# Patient Record
Sex: Female | Born: 1940 | Race: White | Hispanic: No | Marital: Married | Smoking: Never smoker
Health system: Southern US, Community
[De-identification: ages and names within clinical notes are randomized; demographics above are authoritative.]

## PROBLEM LIST (undated history)

## (undated) DIAGNOSIS — D0362 Melanoma in situ of left upper limb, including shoulder: Secondary | ICD-10-CM

## (undated) DIAGNOSIS — Z973 Presence of spectacles and contact lenses: Secondary | ICD-10-CM

## (undated) DIAGNOSIS — I351 Nonrheumatic aortic (valve) insufficiency: Secondary | ICD-10-CM

## (undated) DIAGNOSIS — M4316 Spondylolisthesis, lumbar region: Secondary | ICD-10-CM

## (undated) DIAGNOSIS — Z972 Presence of dental prosthetic device (complete) (partial): Secondary | ICD-10-CM

## (undated) DIAGNOSIS — I779 Disorder of arteries and arterioles, unspecified: Secondary | ICD-10-CM

## (undated) DIAGNOSIS — K219 Gastro-esophageal reflux disease without esophagitis: Secondary | ICD-10-CM

## (undated) DIAGNOSIS — M1990 Unspecified osteoarthritis, unspecified site: Secondary | ICD-10-CM

## (undated) DIAGNOSIS — I340 Nonrheumatic mitral (valve) insufficiency: Secondary | ICD-10-CM

## (undated) DIAGNOSIS — G2581 Restless legs syndrome: Secondary | ICD-10-CM

## (undated) DIAGNOSIS — I10 Essential (primary) hypertension: Secondary | ICD-10-CM

## (undated) DIAGNOSIS — I350 Nonrheumatic aortic (valve) stenosis: Secondary | ICD-10-CM

## (undated) DIAGNOSIS — R0600 Dyspnea, unspecified: Secondary | ICD-10-CM

## (undated) DIAGNOSIS — H9190 Unspecified hearing loss, unspecified ear: Secondary | ICD-10-CM

## (undated) DIAGNOSIS — R011 Cardiac murmur, unspecified: Secondary | ICD-10-CM

## (undated) DIAGNOSIS — I5032 Chronic diastolic (congestive) heart failure: Secondary | ICD-10-CM

## (undated) DIAGNOSIS — I619 Nontraumatic intracerebral hemorrhage, unspecified: Secondary | ICD-10-CM

## (undated) HISTORY — DX: Unspecified osteoarthritis, unspecified site: M19.90

## (undated) HISTORY — PX: POLYPECTOMY: SHX149

## (undated) HISTORY — PX: COLONOSCOPY: SHX174

## (undated) HISTORY — DX: Melanoma in situ of left upper limb, including shoulder: D03.62

## (undated) HISTORY — PX: VAGINAL HYSTERECTOMY: SUR661

## (undated) HISTORY — PX: KNEE SURGERY: SHX244

## (undated) HISTORY — PX: COLONOSCOPY W/ BIOPSIES AND POLYPECTOMY: SHX1376

## (undated) HISTORY — DX: Gastro-esophageal reflux disease without esophagitis: K21.9

## (undated) HISTORY — DX: Restless legs syndrome: G25.81

## (undated) HISTORY — PX: MULTIPLE TOOTH EXTRACTIONS: SHX2053

## (undated) HISTORY — PX: UPPER GASTROINTESTINAL ENDOSCOPY: SHX188

## (undated) HISTORY — DX: Nonrheumatic aortic (valve) stenosis: I35.0

---

## 1998-09-20 ENCOUNTER — Other Ambulatory Visit: Admission: RE | Admit: 1998-09-20 | Discharge: 1998-09-20 | Payer: Self-pay | Admitting: Gastroenterology

## 1999-07-11 ENCOUNTER — Other Ambulatory Visit: Admission: RE | Admit: 1999-07-11 | Discharge: 1999-07-11 | Payer: Self-pay | Admitting: Obstetrics & Gynecology

## 2000-09-27 ENCOUNTER — Other Ambulatory Visit: Admission: RE | Admit: 2000-09-27 | Discharge: 2000-09-27 | Payer: Self-pay | Admitting: Obstetrics & Gynecology

## 2001-11-04 ENCOUNTER — Other Ambulatory Visit: Admission: RE | Admit: 2001-11-04 | Discharge: 2001-11-04 | Payer: Self-pay | Admitting: Obstetrics & Gynecology

## 2002-12-24 ENCOUNTER — Other Ambulatory Visit: Admission: RE | Admit: 2002-12-24 | Discharge: 2002-12-24 | Payer: Self-pay | Admitting: Obstetrics & Gynecology

## 2003-01-05 ENCOUNTER — Encounter: Payer: Self-pay | Admitting: Obstetrics & Gynecology

## 2003-01-05 ENCOUNTER — Ambulatory Visit (HOSPITAL_COMMUNITY): Admission: RE | Admit: 2003-01-05 | Discharge: 2003-01-05 | Payer: Self-pay | Admitting: Obstetrics & Gynecology

## 2003-12-30 ENCOUNTER — Other Ambulatory Visit: Admission: RE | Admit: 2003-12-30 | Discharge: 2003-12-30 | Payer: Self-pay | Admitting: Obstetrics & Gynecology

## 2005-04-13 ENCOUNTER — Other Ambulatory Visit: Admission: RE | Admit: 2005-04-13 | Discharge: 2005-04-13 | Payer: Self-pay | Admitting: Obstetrics & Gynecology

## 2005-09-07 ENCOUNTER — Ambulatory Visit: Payer: Self-pay | Admitting: Internal Medicine

## 2005-10-10 ENCOUNTER — Ambulatory Visit: Payer: Self-pay | Admitting: Internal Medicine

## 2005-12-14 ENCOUNTER — Ambulatory Visit: Payer: Self-pay | Admitting: Internal Medicine

## 2007-05-14 ENCOUNTER — Ambulatory Visit (HOSPITAL_COMMUNITY): Admission: RE | Admit: 2007-05-14 | Discharge: 2007-05-14 | Payer: Self-pay | Admitting: Obstetrics & Gynecology

## 2007-05-22 ENCOUNTER — Encounter: Admission: RE | Admit: 2007-05-22 | Discharge: 2007-05-22 | Payer: Self-pay | Admitting: Obstetrics & Gynecology

## 2007-11-07 ENCOUNTER — Encounter: Admission: RE | Admit: 2007-11-07 | Discharge: 2007-11-07 | Payer: Self-pay | Admitting: Obstetrics & Gynecology

## 2008-05-21 ENCOUNTER — Encounter: Admission: RE | Admit: 2008-05-21 | Discharge: 2008-05-21 | Payer: Self-pay | Admitting: Obstetrics & Gynecology

## 2009-05-24 ENCOUNTER — Encounter: Admission: RE | Admit: 2009-05-24 | Discharge: 2009-05-24 | Payer: Self-pay | Admitting: Obstetrics & Gynecology

## 2010-05-25 ENCOUNTER — Encounter: Admission: RE | Admit: 2010-05-25 | Discharge: 2010-05-25 | Payer: Self-pay | Admitting: Obstetrics & Gynecology

## 2010-08-21 ENCOUNTER — Encounter: Payer: Self-pay | Admitting: Obstetrics & Gynecology

## 2010-12-09 ENCOUNTER — Ambulatory Visit (AMBULATORY_SURGERY_CENTER): Payer: Medicare Other | Admitting: *Deleted

## 2010-12-09 VITALS — Ht 65.0 in | Wt 160.0 lb

## 2010-12-09 DIAGNOSIS — Z86010 Personal history of colonic polyps: Secondary | ICD-10-CM

## 2010-12-09 MED ORDER — PEG-KCL-NACL-NASULF-NA ASC-C 100 G PO SOLR: ORAL | Status: DC

## 2010-12-23 ENCOUNTER — Encounter: Payer: Self-pay | Admitting: Gastroenterology

## 2010-12-23 ENCOUNTER — Ambulatory Visit (AMBULATORY_SURGERY_CENTER): Payer: Medicare Other | Admitting: Gastroenterology

## 2010-12-23 VITALS — BP 138/64 | HR 60 | Temp 96.7°F | Resp 20 | Ht 64.5 in | Wt 155.0 lb

## 2010-12-23 DIAGNOSIS — Z1211 Encounter for screening for malignant neoplasm of colon: Secondary | ICD-10-CM

## 2010-12-23 DIAGNOSIS — Z86010 Personal history of colonic polyps: Secondary | ICD-10-CM

## 2010-12-23 DIAGNOSIS — K648 Other hemorrhoids: Secondary | ICD-10-CM

## 2010-12-23 MED ORDER — SODIUM CHLORIDE 0.9 % IV SOLN: 500.0000 mL | INTRAVENOUS | Status: DC

## 2010-12-23 NOTE — Progress Notes (Signed)
Patient moved to conference room to wake up more before discharge.  Coffee given and husband with patient

## 2010-12-23 NOTE — Patient Instructions (Signed)
Please review discharge instructions Please read information about internal hemorrhoids

## 2010-12-27 ENCOUNTER — Telehealth: Payer: Self-pay | Admitting: *Deleted

## 2010-12-27 NOTE — Telephone Encounter (Signed)

## 2011-04-17 ENCOUNTER — Other Ambulatory Visit: Payer: Self-pay | Admitting: Obstetrics & Gynecology

## 2011-04-17 DIAGNOSIS — Z1231 Encounter for screening mammogram for malignant neoplasm of breast: Secondary | ICD-10-CM

## 2011-05-10 ENCOUNTER — Ambulatory Visit
Admission: RE | Admit: 2011-05-10 | Discharge: 2011-05-10 | Disposition: A | Discharge status: Home / Self Care | Payer: Medicare Other | Source: Ambulatory Visit | Attending: Obstetrics & Gynecology | Admitting: Obstetrics & Gynecology

## 2011-05-10 DIAGNOSIS — Z1231 Encounter for screening mammogram for malignant neoplasm of breast: Secondary | ICD-10-CM

## 2011-05-17 ENCOUNTER — Other Ambulatory Visit: Payer: Self-pay | Admitting: Obstetrics & Gynecology

## 2011-05-17 DIAGNOSIS — R928 Other abnormal and inconclusive findings on diagnostic imaging of breast: Secondary | ICD-10-CM

## 2011-05-29 ENCOUNTER — Ambulatory Visit
Admission: RE | Admit: 2011-05-29 | Discharge: 2011-05-29 | Disposition: A | Discharge status: Home / Self Care | Payer: Medicare Other | Source: Ambulatory Visit | Attending: Obstetrics & Gynecology | Admitting: Obstetrics & Gynecology

## 2011-05-29 DIAGNOSIS — R928 Other abnormal and inconclusive findings on diagnostic imaging of breast: Secondary | ICD-10-CM

## 2012-12-29 ENCOUNTER — Emergency Department (HOSPITAL_COMMUNITY)
Admission: EM | Admit: 2012-12-29 | Discharge: 2012-12-29 | Disposition: A | Discharge status: Home / Self Care | Payer: Medicare Other | Attending: Emergency Medicine | Admitting: Emergency Medicine

## 2012-12-29 ENCOUNTER — Encounter (HOSPITAL_COMMUNITY): Payer: Self-pay | Admitting: Emergency Medicine

## 2012-12-29 DIAGNOSIS — K219 Gastro-esophageal reflux disease without esophagitis: Secondary | ICD-10-CM | POA: Insufficient documentation

## 2012-12-29 DIAGNOSIS — R231 Pallor: Secondary | ICD-10-CM | POA: Insufficient documentation

## 2012-12-29 DIAGNOSIS — R55 Syncope and collapse: Secondary | ICD-10-CM

## 2012-12-29 DIAGNOSIS — R42 Dizziness and giddiness: Secondary | ICD-10-CM | POA: Insufficient documentation

## 2012-12-29 DIAGNOSIS — M129 Arthropathy, unspecified: Secondary | ICD-10-CM | POA: Insufficient documentation

## 2012-12-29 DIAGNOSIS — Z79899 Other long term (current) drug therapy: Secondary | ICD-10-CM | POA: Insufficient documentation

## 2012-12-29 LAB — URINALYSIS, ROUTINE W REFLEX MICROSCOPIC
Bilirubin Urine: NEGATIVE
Glucose, UA: NEGATIVE mg/dL
Hgb urine dipstick: NEGATIVE
Ketones, ur: 15 mg/dL — AB
Leukocytes, UA: NEGATIVE
Nitrite: NEGATIVE
Protein, ur: NEGATIVE mg/dL
Specific Gravity, Urine: 1.021 (ref 1.005–1.030)
Urobilinogen, UA: 0.2 mg/dL (ref 0.0–1.0)
pH: 6 (ref 5.0–8.0)

## 2012-12-29 LAB — POCT I-STAT TROPONIN I
Troponin i, poc: 0 ng/mL (ref 0.00–0.08)
Troponin i, poc: 0 ng/mL (ref 0.00–0.08)

## 2012-12-29 LAB — CBC WITH DIFFERENTIAL/PLATELET
Basophils Absolute: 0 10*3/uL (ref 0.0–0.1)
Basophils Relative: 0 % (ref 0–1)
Eosinophils Absolute: 0 10*3/uL (ref 0.0–0.7)
Eosinophils Relative: 0 % (ref 0–5)
HCT: 39.9 % (ref 36.0–46.0)
Hemoglobin: 13.6 g/dL (ref 12.0–15.0)
Lymphocytes Relative: 2 % — ABNORMAL LOW (ref 12–46)
Lymphs Abs: 0.4 K/uL — ABNORMAL LOW (ref 0.7–4.0)
MCH: 32.6 pg (ref 26.0–34.0)
MCHC: 34.1 g/dL (ref 30.0–36.0)
MCV: 95.7 fL (ref 78.0–100.0)
Monocytes Absolute: 0.3 10*3/uL (ref 0.1–1.0)
Monocytes Relative: 1 % — ABNORMAL LOW (ref 3–12)
Neutro Abs: 16.6 10*3/uL — ABNORMAL HIGH (ref 1.7–7.7)
Neutrophils Relative %: 96 % — ABNORMAL HIGH (ref 43–77)
Platelets: 227 K/uL (ref 150–400)
RBC: 4.17 MIL/uL (ref 3.87–5.11)
RDW: 12.8 % (ref 11.5–15.5)
WBC: 17.3 K/uL — ABNORMAL HIGH (ref 4.0–10.5)

## 2012-12-29 LAB — BASIC METABOLIC PANEL
BUN: 16 mg/dL (ref 6–23)
Chloride: 102 mEq/L (ref 96–112)
Creatinine, Ser: 0.74 mg/dL (ref 0.50–1.10)
GFR calc Af Amer: >90 mL/min (ref >90)
GFR calc non Af Amer: 84 mL/min — ABNORMAL LOW (ref >90)
Potassium: 3.7 mEq/L (ref 3.5–5.1)

## 2012-12-29 LAB — BASIC METABOLIC PANEL WITH GFR
CO2: 26 meq/L (ref 19–32)
Calcium: 9.5 mg/dL (ref 8.4–10.5)
Glucose, Bld: 113 mg/dL — ABNORMAL HIGH (ref 70–99)
Sodium: 137 meq/L (ref 135–145)

## 2012-12-29 MED ORDER — SODIUM CHLORIDE 0.9 % IV BOLUS (SEPSIS)
1000.0000 mL | Freq: Once | INTRAVENOUS | Status: AC
  Administered 2012-12-29: 1000 mL via INTRAVENOUS

## 2012-12-29 NOTE — Discharge Instructions (Signed)
Near-Syncope  Near-syncope is sudden weakness, dizziness, or feeling like you might pass out (faint). This may occur when getting up after sitting or while standing for a long period of time. Near-syncope can be caused by a drop in blood pressure. This is a common reaction, but it may occur to a greater degree in people taking medicines to control their blood pressure. Fainting often occurs when the blood pressure or pulse is too low to provide enough blood flow to the brain to keep you conscious. Fainting and near-syncope are not usually due to serious medical problems. However, certain people should be more cautious in the event of near-syncope, including elderly patients, patients with diabetes, and patients with a history of heart conditions (especially irregular rhythms).   CAUSES    Drop in blood pressure.   Physical pain.   Dehydration.   Heat exhaustion.   Emotional distress.   Low blood sugar.   Internal bleeding.   Heart and circulatory problems.   Infections.  SYMPTOMS    Dizziness.   Feeling sick to your stomach (nauseous).   Nearly fainting.   Body numbness.   Turning pale.   Tunnel vision.   Weakness.  HOME CARE INSTRUCTIONS    Lie down right away if you start feeling like you might faint. Breathe deeply and steadily. Wait until all the symptoms have passed. Most of these episodes last only a few minutes. You may feel tired for several hours.   Drink enough fluids to keep your urine clear or pale yellow.   If you are taking blood pressure or heart medicine, get up slowly, taking several minutes to sit and then stand. This can reduce dizziness that is caused by a drop in blood pressure.  SEEK IMMEDIATE MEDICAL CARE IF:    You have a severe headache.   Unusual pain develops in the chest, abdomen, or back.   There is bleeding from the mouth or rectum, or you have black or tarry stool.   An irregular heartbeat or a very rapid pulse develops.   You have repeated fainting or  seizure-like jerking during an episode.   You faint when sitting or lying down.   You develop confusion.   You have difficulty walking.   Severe weakness develops.   Vision problems develop.  MAKE SURE YOU:    Understand these instructions.   Will watch your condition.   Will get help right away if you are not doing well or get worse.  Document Released: 07/17/2005 Document Revised: 10/09/2011 Document Reviewed: 09/02/2010  [employer] Patient Information 2014 [employer].

## 2012-12-29 NOTE — ED Notes (Signed)
Pt states she had breakfast this morning and felt normal. However pt states that at 0930 she started feeling weak all of a sudden and felt like she might pass out. Pt states she feels nauseous, pt states her left shoulder is a little achy, but that the pt states she believes the pain is from her arthritis. Pt states she has not vomited. Pt is alert and oriented x4. Pt states she had a bowel movement this morning. Pt states she feels weak and fatigue at this time. Pt states she had a dry hacking cough that developed this morning.

## 2012-12-29 NOTE — ED Notes (Signed)
Pt with tray at the bedside.

## 2012-12-29 NOTE — ED Notes (Signed)
Pt. Stated, Around 1030 this morning while getting ready for church, I started to get weak and nauseated. And both of my hands where hurting, but I have arthritis.

## 2012-12-29 NOTE — ED Notes (Signed)
Tray ordered for pt.

## 2012-12-29 NOTE — ED Provider Notes (Signed)
History     CSN: [id number]  Arrival date & time 12/29/12  1151   First MD Initiated Contact with Patient 12/29/12 1159      Chief Complaint  Patient presents with  . Weakness    (Consider location/radiation/quality/duration/timing/severity/associated sxs/prior treatment) HPI Comments: No fevers, chills, no coughing, no URI symptoms, no dysuria, no N/V/D, no black stools.  No h/o CAD, CVA, TIA. No recent long distance travel.  Patient is a 72 y.o. female presenting with weakness. The history is provided by the patient and the spouse.  Weakness This is a new problem. The current episode started 3 to 5 hours ago (Began while sitting down.  Had eaten dinner last night, slept as usual last night, had eaten breakfast this AM). The problem occurs constantly. The problem has been gradually improving. Pertinent negatives include no chest pain, no abdominal pain, no headaches and no shortness of breath. Associated symptoms comments: Nausea, no diaphoresis, transient arthritis like pain in hands, right shoulder. Nothing aggravates the symptoms. Nothing relieves the symptoms. She has tried nothing for the symptoms. The treatment provided no relief.    Past Medical History  Diagnosis Date  . Arthritis   . GERD (gastroesophageal reflux disease)     mild    Past Surgical History  Procedure Laterality Date  . Vaginal hysterectomy    . Cesarean section    . Colonoscopy    . Polypectomy    . Upper gastrointestinal endoscopy      History reviewed. No pertinent family history.  History  Substance Use Topics  . Smoking status: Never Smoker   . Smokeless tobacco: Never Used  . Alcohol Use: No    OB History   Grav Para Term Preterm Abortions TAB SAB Ect Mult Living                  Review of Systems  Constitutional: Negative for fever, chills, diaphoresis and appetite change.  HENT: Negative for congestion, neck pain and neck stiffness.   Eyes: Negative for visual disturbance.   Respiratory: Negative for cough and shortness of breath.   Cardiovascular: Negative for chest pain.  Gastrointestinal: Positive for nausea. Negative for vomiting, abdominal pain, constipation and blood in stool.  Musculoskeletal: Positive for arthralgias. Negative for back pain.  Skin: Positive for pallor.  Neurological: Positive for weakness and light-headedness. Negative for numbness and headaches.  All other systems reviewed and are negative.    Allergies  Ropinirole and Sulfa antibiotics  Home Medications   Current Outpatient Rx  Name  Route  Sig  Dispense  Refill  . ESTRADIOL ACETATE PO   Oral   Take 1 tablet by mouth at bedtime. TAke 1/2 tablet daily         . Multiple Vitamins-Minerals (MULTIVITAMIN WITH MINERALS) tablet   Oral   Take 1 tablet by mouth daily. Comes in a daily package.  Takes one package a day          . omeprazole (PRILOSEC) 20 MG capsule   Oral   Take 20 mg by mouth daily.           . pramipexole (MIRAPEX) 0.25 MG tablet   Oral   Take 0.125-0.25 mg by mouth 2 (two) times daily. Takes 0.25 mg at 1600 and 0.125mg  at bedtime.         . sulindac (CLINORIL) 200 MG tablet   Oral   Take 200 mg by mouth daily as needed (arthritis).  BP 107/44  Pulse 81  Temp(Src) 98.4 F (36.9 C) (Oral)  Resp 21  SpO2 98%  Physical Exam  Nursing note and vitals reviewed. Constitutional: She is oriented to person, place, and time. She appears well-developed and well-nourished. No distress.  HENT:  Head: Normocephalic and atraumatic.  Mouth/Throat: Oropharynx is clear and moist.  Eyes: Conjunctivae and EOM are normal. No scleral icterus.  Neck: Normal range of motion. Neck supple. No JVD present.  Cardiovascular: Normal rate, regular rhythm and intact distal pulses.   No murmur heard. Pulmonary/Chest: Effort normal and breath sounds normal. No stridor.  Abdominal: Soft. She exhibits no distension. There is no tenderness. There is no rebound  and no guarding.  Musculoskeletal: She exhibits no edema and no tenderness.  Neurological: She is alert and oriented to person, place, and time. No cranial nerve deficit. She exhibits normal muscle tone. Coordination normal.  Skin: Skin is warm and dry. No rash noted. She is not diaphoretic. No pallor.  Psychiatric: She has a normal mood and affect.    ED Course  Procedures (including critical care time)  Labs Reviewed  CBC WITH DIFFERENTIAL - Abnormal; Notable for the following:    WBC 17.3 (*)    Neutrophils Relative % 96 (*)    Neutro Abs 16.6 (*)    Lymphocytes Relative 2 (*)    Lymphs Abs 0.4 (*)    Monocytes Relative 1 (*)    All other components within normal limits  BASIC METABOLIC PANEL - Abnormal; Notable for the following:    Glucose, Bld 113 (*)    GFR calc non Af Amer 84 (*)    All other components within normal limits  URINALYSIS, ROUTINE W REFLEX MICROSCOPIC - Abnormal; Notable for the following:    Ketones, ur 15 (*)    All other components within normal limits  POCT I-STAT TROPONIN I  POCT I-STAT TROPONIN I   No results found.   1. Near syncope     ra sat is 100% and I interpret to be normal  ECG at time 12:00 shows SR with occasional PVC at rate 76, normal axis, non specific flattened T wave inferior and lateral leads, normal intervals.  Borderline ECG, no priors for comparison.  1:20 PM Pt has had normal, but borderline low BP's in the ED.  Not orthostatic by BP or HR however, IVF's given.  Initial troponin I is normal.  Pt remains asymptomatic.     4:03 PM Pt feels resolved back to normal, no CP's, BP have been normal, second tropnin is normal.  Will d/c home, recommend close outpt follow up with PCP.  Pt and family are in agreement. MDM  Pt with sudden onset of near syncope, nausea, no CP, diaphoresis, HA.  Pt had transient right shoulder pain.  No sig risk factors for CAD.  No CVA symptoms.  Will monitor on telemetry in the ED for a couple of hours,  obtain serial troponins and watch for worsening symptoms.  Check orthostatics.  Routine electrolytes and blood counts will be checked.        Gavin Pound. Laree Garron, MD 12/29/12 6045

## 2013-05-22 ENCOUNTER — Ambulatory Visit (INDEPENDENT_AMBULATORY_CARE_PROVIDER_SITE_OTHER): Payer: Medicare Other | Admitting: Neurology

## 2013-05-22 ENCOUNTER — Encounter: Payer: Self-pay | Admitting: Neurology

## 2013-05-22 VITALS — BP 130/84 | HR 65 | Temp 98.0°F | Resp 14 | Ht 64.0 in | Wt 184.1 lb

## 2013-05-22 DIAGNOSIS — R209 Unspecified disturbances of skin sensation: Secondary | ICD-10-CM

## 2013-05-22 DIAGNOSIS — G609 Hereditary and idiopathic neuropathy, unspecified: Secondary | ICD-10-CM

## 2013-05-22 DIAGNOSIS — G629 Polyneuropathy, unspecified: Secondary | ICD-10-CM

## 2013-05-22 NOTE — Patient Instructions (Addendum)
1. Check blood work today. Go to [facility] located at [facility] for Glucose Tolerance Test. T,W, or TH morning, fast for 8 hrs before test. #[id number] 2.  Lyrica samples provided with titration schedule 3.  Return 2-3 weeks

## 2013-05-22 NOTE — Progress Notes (Signed)
[facility] Clinic Note - Initial Visit   Date: 05/22/2013   Tiffany Washington MRN: [id number] DOB: 1940/09/10   Dear Dr Link Snuffer:  Thank you for your kind referral of Tiffany Washington for consultation of leg discomfort. Although her history is well known to you, please allow [place] to reiterate it for the purpose of our medical record. The patient was accompanied to the clinic by husband.   History of Present Illness: Tiffany Washington is a 72 y.o. year-old right-handed Caucasian female with history of GERD, osteoathritis, and restless leg syndrome presenting for evaluation of bilateral leg achiness.  She reports having chronic leg pain for at least 20 years, which would occur intermittently, however over the past year symptoms have become more constant.  Pain is described as "as if legs can't hold me up and achy".  Pain is 8/10 when severe.  She has intermittent tingling and burning sensation from her knees down which started one-year ago.  Symptoms are worse in the evening and with activity.  She takes Aleve and motrin, which seems to help at times.  She gets chiropractor manipulations once per month.  Denies any numbness, early satiety, or anhydrosis.  She endorses dry eyes and dry mouth.  Her mood is good and she stays active.  Her pain does not limit her daily activity but although it bothers her, she is able to work despite her discomfort.  She has difficulty with putting her socks on or activities that requires her to bend over.  Further, she complains of dropping objects at times and problems with fine motor movement.  She has previously seen Dr. Titus Dubin for arthritis and Dr. Juliene Pina for hip pain (August 2014), for which she has received steroid injections with little relief.  She takes pramipexole for the past 15 years for RLS which helps.   Past Medical History  Diagnosis Date  . Arthritis   . GERD (gastroesophageal reflux disease)     mild  . Restless leg  syndrome   . 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 Vaginal hysterectomy    . Cesarean section    . Colonoscopy    . Polypectomy    . Upper gastrointestinal endoscopy       Medications:  Current Outpatient Prescriptions on File Prior to Visit  Medication Sig Dispense Refill  . ESTRADIOL ACETATE PO Take 1 tablet by mouth at bedtime. TAke 1/2 tablet daily      . Multiple Vitamins-Minerals (MULTIVITAMIN WITH MINERALS) tablet Take 1 tablet by mouth daily. Comes in a daily package.  Takes one package a day       . omeprazole (PRILOSEC) 20 MG capsule Take 20 mg by mouth daily.        . pramipexole (MIRAPEX) 0.25 MG tablet Take 0.125-0.25 mg by mouth 2 (two) times daily. Takes 0.25 mg at 1600 and 0.125mg  at bedtime.      . sulindac (CLINORIL) 200 MG tablet Take 200 mg by mouth daily as needed (arthritis).        Current Facility-Administered Medications on File Prior to Visit  Medication Dose Route Frequency Provider Last Rate Last Dose  . 0.9 %  sodium chloride infusion  500 mL Intravenous Continuous Louis Meckel, MD        Allergies:  Allergies  Allergen Reactions  . Carbamazepine   . Gabapentin   . Indocin [Indomethacin]   . Meloxicam   .  Ropinirole     Speed the legs up.  . Sulfa Antibiotics Diarrhea and Nausea And Vomiting  . Voltaren [Diclofenac Sodium]     Family History: Family History  Problem Relation Age of Onset  . Hypertension Mother   . Congestive Heart Failure Mother     Died, 77  . Pulmonary embolism Father     Died, 43  . Diabetes Brother   . Diabetes Sister   . Gout Son     Social History: History   Social History  . Marital Status: Married    Spouse Name: N/A    Number of Children: N/A  . Years of Education: N/A   Occupational History  . Not on file.   Social History Main Topics  . Smoking status: Never Smoker   . Smokeless tobacco: Never Used  . Alcohol Use: No  . Drug Use:  No  . Sexual Activity: Not on file   Other Topics Concern  . 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   She is married for 52 years and they have two children.    Review of Systems:  CONSTITUTIONAL: No fevers, chills, night sweats, or weight loss.   EYES: No visual changes or eye pain ENT: No hearing changes.  No history of nose bleeds.   RESPIRATORY: No cough, wheezing and shortness of breath.   CARDIOVASCULAR: Negative for chest pain, and palpitations.   GI: Negative for abdominal discomfort, blood in stools or black stools.   GU:  No history of incontinence.   MUSCLOSKELETAL: + history of joint pain or swelling.  +myalgias.   SKIN: Negative for lesions, rash, and itching.   HEMATOLOGY/ONCOLOGY: Negative for prolonged bleeding, bruising easily, and swollen nodes.     ENDOCRINE: Negative for cold or heat intolerance, polydipsia or goiter.   PSYCH:  No depression or anxiety symptoms.   NEURO: As Above.   Vital Signs:  BP 130/84  Pulse 65  Temp(Src) 98 F (36.7 C)  Resp 14  Ht 5\' 4"  (1.626 m)  Wt 184 lb 1.6 oz (83.507 kg)  BMI 31.59 kg/m2 Pain Scale: 3 on a scale of 0-10    Neurological Exam: MENTAL STATUS including orientation to time, place, person, recent and remote memory, attention span and concentration, language, and fund of knowledge is normal.  Speech is not dysarthric.  CRANIAL NERVES: II:  No visual field defects.  Unremarkable fundi.   III-IV-VI: Pupils equal round and reactive to light.  Normal conjugate, extra-ocular eye movements in all directions of gaze.  No nystagmus.  No ptosis.   V:  Normal facial sensation.   VII:  Normal facial symmetry and movements.   VIII:  Normal hearing and vestibular function.   IX-X:  Normal palatal movement.   XI:  Normal shoulder shrug and head rotation.   XII:  Normal tongue strength and range of motion, no deviation or  fasciculation.  MOTOR:  No atrophy, fasciculations or abnormal movements. Mild tenderness to muscle palpation over the lower legs bilaterally. She does not have tenderness to fibromyalgia tender points.  No pronator drift.  Tone is normal.    Right Upper Extremity:    Left Upper Extremity:    Deltoid  5/5   Deltoid  5/5   Biceps  5/5   Biceps  5/5   Triceps  5/5   Triceps  5/5   Wrist extensors  5/5   Wrist extensors  5/5   Wrist flexors  5/5   Wrist flexors  5/5   Finger extensors  5/5   Finger extensors  5/5   Finger flexors  5/5   Finger flexors  5/5   Dorsal interossei  5/5   Dorsal interossei  5/5   Abductor pollicis  5/5   Abductor pollicis  5/5   Tone (Ashworth scale)  0  Tone (Ashworth scale)  0   Right Lower Extremity:    Left Lower Extremity:    Hip flexors  5/5   Hip flexors  5/5   Hip extensors  5/5   Hip extensors  5/5   Knee flexors  5/5   Knee flexors  5/5   Knee extensors  5/5   Knee extensors  5/5   Dorsiflexors  5/5   Dorsiflexors  5/5   Plantarflexors  5/5   Plantarflexors  5/5   Toe extensors  5/5   Toe extensors  5/5   Toe flexors  5/5   Toe flexors  5/5   Tone (Ashworth scale)  0  Tone (Ashworth scale)  0   MSRs:  Right                                                                 Left brachioradialis 2+  brachioradialis 2+  biceps 2+  biceps 2+  triceps 2+  triceps 2+  patellar 2+  patellar 2+  ankle jerk 1+  ankle jerk 1+  Hoffman no  Hoffman no  plantar response down  plantar response down   SENSORY:  Normal and symmetric perception of light touch, pinprick, vibration, and proprioception.  Romberg's sign absent.   COORDINATION/GAIT: Normal finger-to- nose-finger and heel-to-shin.  Intact rapid alternating movements bilaterally.  Able to rise from a chair without using arms.  Gait antalgic, but appears stable.  She is able to stand on toes, but unable to toe walk.  Heel walking and tandem gait intact.  Data: None  IMPRESSION: Tiffany Washington is a 72  year-old female presenting for evaluation of bilateral leg paresthesias.  Her neurological examination does not disclose any muscle weakness or sensory deficits.  Reflexes are also intact. 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  There is no evidence of large fiber neuropathy so I do not feel that an EMG would be helpful in this case.  Going forward, EMG may be considered if symptoms do not improve with neuropathic medications.  I would like to check for treatable causes of neuropathy and offer her a trial of Lyrica for her pain.  She did not tolerate neurontin and carbamazepine in the past.   PLAN/RECOMMENDATIONS:  1. Check the following labs: 2hr glucose tolerance test, vitamin B12, MMA, copper, ceruloplasmin, SPEP/UPEP with IFE, CK, aldolase 2. Trial of Lyrica for pain, start very slow titration because she is sensitive to medications.  Discussed risks and benefits 3. Return to clinic in 2-3 weeks    The duration of this appointment visit was 60 minutes of face-to-face time with the patient.  Greater than 50% of this time was spent in counseling, explanation of diagnosis, planning of further management, and coordination of care.   Thank you for  allowing me to participate in patient's care.  If I can answer any additional questions, I would be pleased to do so.    Sincerely,    Donika K. Posey Pronto, DO

## 2013-05-24 LAB — CERULOPLASMIN: Ceruloplasmin: 28 mg/dL (ref 20–60)

## 2013-05-24 LAB — ALDOLASE: Aldolase: 6.8 U/L (ref <=8.1)

## 2013-05-24 LAB — COPPER, SERUM: Copper: 93 ug/dL (ref 70–175)

## 2013-05-26 LAB — PROTEIN ELECTROPHORESIS, SERUM
Albumin ELP: 58.1 % (ref 55.8–66.1)
Total Protein, Serum Electrophoresis: 7.3 g/dL (ref 6.0–8.3)

## 2013-05-26 LAB — PROTEIN ELECTROPHORESIS, URINE REFLEX: Total Protein, Urine: 7 mg/dL

## 2013-05-26 LAB — IMMUNOFIXATION ELECTROPHORESIS
IgA: 239 mg/dL (ref 69–380)
IgG (Immunoglobin G), Serum: 1030 mg/dL (ref 690–1700)
IgM, Serum: 160 mg/dL (ref 52–322)
Total Protein, Serum Electrophoresis: 7.3 g/dL (ref 6.0–8.3)

## 2013-05-27 ENCOUNTER — Other Ambulatory Visit: Payer: Medicare Other

## 2013-05-27 DIAGNOSIS — R209 Unspecified disturbances of skin sensation: Secondary | ICD-10-CM

## 2013-05-27 LAB — GLUCOSE TOLERANCE, 2 HOURS: Glucose, 2 hour: 117 mg/dL

## 2013-05-30 ENCOUNTER — Telehealth: Payer: Self-pay | Admitting: Neurology

## 2013-05-30 LAB — METHYLMALONIC ACID(MMA), RND URINE
Creatinine: 13.73 mmol/L (ref 2.38–26.55)
Methylmalonic acid: 1.6 mmol/mol{creat} (ref 0.3–1.9)

## 2013-05-30 NOTE — Telephone Encounter (Signed)
Call to discuss lab results, but there was no answer. Message left on answering machine.  Gregorey Nabor K. Allena Katz, DO

## 2013-06-03 ENCOUNTER — Ambulatory Visit (INDEPENDENT_AMBULATORY_CARE_PROVIDER_SITE_OTHER): Payer: Medicare Other | Admitting: Neurology

## 2013-06-03 ENCOUNTER — Encounter: Payer: Self-pay | Admitting: Neurology

## 2013-06-03 VITALS — BP 140/80 | HR 72 | Temp 98.0°F | Resp 16 | Ht 63.0 in | Wt 189.5 lb

## 2013-06-03 DIAGNOSIS — R748 Abnormal levels of other serum enzymes: Secondary | ICD-10-CM

## 2013-06-03 DIAGNOSIS — R209 Unspecified disturbances of skin sensation: Secondary | ICD-10-CM

## 2013-06-03 DIAGNOSIS — G629 Polyneuropathy, unspecified: Secondary | ICD-10-CM | POA: Insufficient documentation

## 2013-06-03 DIAGNOSIS — G609 Hereditary and idiopathic neuropathy, unspecified: Secondary | ICD-10-CM

## 2013-06-03 LAB — SEDIMENTATION RATE: Sed Rate: 14 mm/hr (ref 0–22)

## 2013-06-03 MED ORDER — PREGABALIN 50 MG PO CAPS: 50.0000 mg | ORAL_CAPSULE | Freq: Two times a day (BID) | ORAL | Status: DC

## 2013-06-03 NOTE — Progress Notes (Signed)
[facility]  Follow-up Visit   Date: 06/03/2013    Tiffany Washington MRN: [id number] DOB: 04/09/1941   Interim History: Tiffany Washington is a 72 y.o. year-old right-handed Caucasian female with history of GERD, osteoathritis, and restless leg syndrome returning to the clinic regarding bilateral leg achiness.   At her last visit, a trial of Lyrica was given and when she got to 75mg  daily, she noticed that her tingling has essentially resolved.  She reports that her leg achiness has also improved.  She is able to sleep through the night much better.  Prior to starting Lyrica, she would always wake up at 2am, but now she is able to sleep until 7am.     History of present illness: She reports having chronic leg pain for at least 20 years, which would occur intermittently, however over the past year symptoms have become more constant. Pain is described as "as if legs can't hold me up and achy". Pain is 8/10 when severe. She has intermittent tingling and burning sensation from her knees down which started one-year ago. Symptoms are worse in the evening and with activity. She takes Aleve and motrin, which seems to help at times. She gets chiropractor manipulations once per month. Denies any numbness, early satiety, or anhydrosis. She endorses dry eyes and dry mouth. Her mood is good and she stays active. Her pain does not limit her daily activity but although it bothers her, she is able to work despite her discomfort. She has difficulty with putting her socks on or activities that requires her to bend over. Further, she complains of dropping objects at times and problems with fine motor movement.   She takes pramipexole for the past 15 years for RLS which helps.   Medications:  Current Outpatient Prescriptions on File Prior to Visit  Medication Sig Dispense Refill  . ESTRADIOL ACETATE PO Take 1 tablet by mouth at bedtime. TAke 1/2 tablet daily      . Multiple  Vitamins-Minerals (MULTIVITAMIN WITH MINERALS) tablet Take 1 tablet by mouth daily. Comes in a daily package.  Takes one package a day       . omeprazole (PRILOSEC) 20 MG capsule Take 20 mg by mouth daily.        . pramipexole (MIRAPEX) 0.25 MG tablet Take 0.125-0.25 mg by mouth 2 (two) times daily. Takes 0.25 mg at 1600 and 0.125mg  at bedtime.      . sulindac (CLINORIL) 200 MG tablet Take 200 mg by mouth daily as needed (arthritis).        No current facility-administered medications on file prior to visit.    Allergies:  Allergies  Allergen Reactions  . Carbamazepine   . Gabapentin   . Indocin [Indomethacin]   . Meloxicam   . Ropinirole     Speed the legs up.  . Sulfa Antibiotics Diarrhea and Nausea And Vomiting  . Voltaren [Diclofenac Sodium]      Review of Systems:  CONSTITUTIONAL: No fevers, chills, night sweats, or weight loss.   EYES: No visual changes or eye pain ENT: No hearing changes.  No history of nose bleeds.   RESPIRATORY: No cough, wheezing and shortness of breath.   CARDIOVASCULAR: Negative for chest pain, and palpitations.   GI: Negative for abdominal discomfort, blood in stools or black stools.  No recent change in bowel habits.   GU:  No history of incontinence.   MUSCLOSKELETAL: No history of joint pain or swelling.  No myalgias.  SKIN: Negative for lesions, rash, and itching.   HEMATOLOGY/ONCOLOGY: Negative for prolonged bleeding, bruising easily, and swollen nodes.  No history of cancer.   ENDOCRINE: Negative for cold or heat intolerance, polydipsia or goiter.   PSYCH:  no depression or anxiety symptoms.   NEURO: As Above.   Vital Signs:  BP 140/80  Pulse 72  Temp(Src) 98 F (36.7 C) (Oral)  Resp 16  Ht 5\' 3"  (1.6 m)  Wt 189 lb 8 oz (85.957 kg)  BMI 33.58 kg/m2   Neurological Exam: MENTAL STATUS including orientation to time, place, person, recent and remote memory, attention span and concentration, language, and fund of knowledge is normal.   Speech is not dysarthric.  CRANIAL NERVES: II:  No visual field defects.   III-IV-VI: Pupils equal round and reactive to light.  Normal conjugate, extra-ocular eye movements in all directions of gaze.  No nystagmus.  No ptosis prior to or post sustained upgaze.   V:  Normal facial sensation.     VII:  Normal facial symmetry and movements.  VIII:  Normal hearing and vestibular function.   IX-X:  Normal palatal movement.   XI:  Normal shoulder shrug and head rotation.   XII:  Normal tongue strength and range of motion, no deviation or fasciculation.  MOTOR:  No atrophy, fasciculations or abnormal movements.  No pronator drift.  Tone is normal.  Mild tenderness to muscle palpation over the lower legs bilaterally. No myotonia.  Right Upper Extremity:    Left Upper Extremity:    Deltoid  5/5   Deltoid  5/5   Biceps  5/5   Biceps  5/5   Triceps  5/5   Triceps  5/5   Wrist extensors  5/5   Wrist extensors  5/5   Wrist flexors  5/5   Wrist flexors  5/5   Finger extensors  5/5   Finger extensors  5/5   Finger flexors  5/5   Finger flexors  5/5   Dorsal interossei  5/5   Dorsal interossei  5/5   Abductor pollicis  5/5   Abductor pollicis  5/5   Tone (Ashworth scale)  0  Tone (Ashworth scale)  0   Right Lower Extremity:    Left Lower Extremity:    Hip flexors  5/5   Hip flexors  5/5   Hip extensors  5/5   Hip extensors  5/5   Knee flexors  5/5   Knee flexors  5/5   Knee extensors  5/5   Knee extensors  5/5   Dorsiflexors  5/5   Dorsiflexors  5/5   Plantarflexors  5/5   Plantarflexors  5/5   Toe extensors  5/5   Toe extensors  5/5   Toe flexors  5/5   Toe flexors  5/5   Tone (Ashworth scale)  0  Tone (Ashworth scale)  0   MSRs:  Right                                                                 Left brachioradialis 2+  brachioradialis 2+  biceps 2+  biceps 2+  triceps 2+  triceps 2+  patellar 2+  patellar 2+  ankle jerk 1+  ankle jerk 1+  Hoffman no  Hoffman  no  plantar response  down  plantar response down   SENSORY: Normal and symmetric perception of light touch, pinprick, vibration, and proprioception. Romberg's sign absent.   COORDINATION/GAIT: Normal finger-to- nose-finger and heel-to-shin. Intact rapid alternating movements bilaterally. Able to rise from a chair without using arms. Gait antalgic, but appears stable. She is able to stand on toes, but unable to toe walk. Heel walking and tandem gait intact.     Data: Component     Latest Ref Rng 05/22/2013 05/27/2013  Glucose, Fasting     70 - 99 mg/dL  811 (H)  Glucose, GTT - 1 Hour       156  Glucose, 2 hour       117  Methylmalonic acid     0.3 - 1.9 mmol/mol creat  1.6  Creatinine     2.38 - 26.55 mmol/L  13.73  Vitamin B-12     211 - 911 pg/mL 1183 (H)   Copper     70 - 175 mcg/dL  93  Ceruloplasmin     20 - 60 mg/dL  28  CK Total     7 - 914 U/L 313 (H)   Aldolase     <=8.1 U/L  6.8    IMPRESSION: 1.  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  Check ESR, CRP, fractionated CK 2.  If CK remains elevated, will obtain EMG - myopathy protocol 3.  Continue Lyrica 50mg  BID.  Samples provided (1) box Lyrica 50mg  - 21 capsules Exp 11/2014, Lot [id number] 4.  Return to clinic in 16-month   The duration of this appointment visit was 30 minutes of face-to-face time with the patient.  Greater than 50% of this time was spent in counseling, explanation of diagnosis, planning of further management, and coordination of care.   Thank you for allowing me to participate in patient's care.  If I can answer any  additional questions, I would be pleased to do so.    Sincerely,    Donika K. Allena Katz, DO

## 2013-06-03 NOTE — Patient Instructions (Signed)
1.  Check blood work today 2.  EMG of the legs 3.  Return to clinic in 4 weeks

## 2013-06-03 NOTE — Progress Notes (Signed)
FAXED TO [phone] AT 1600 06/03/2013

## 2013-06-04 LAB — CK TOTAL AND CKMB (NOT AT ARMC)
Relative Index: 2.2 (ref 0.0–4.0)
Total CK: 163 U/L (ref 7–177)

## 2013-06-05 ENCOUNTER — Telehealth: Payer: Self-pay | Admitting: Neurology

## 2013-06-05 NOTE — Telephone Encounter (Signed)
Spoke w/ pt, she is aware lab was normal, EMG cancelled and she will come in as scheduled for her 4 week follow up / Roanna Raider

## 2013-06-05 NOTE — Telephone Encounter (Signed)
Called patient with results of labs (CK normal), so no need for EMG, but there was no answer.  Message was left.  Bernece Gall K. Allena Katz, DO

## 2013-06-16 ENCOUNTER — Other Ambulatory Visit: Payer: Self-pay | Admitting: Neurology

## 2013-06-19 ENCOUNTER — Encounter: Payer: Medicare Other | Admitting: Neurology

## 2013-06-30 ENCOUNTER — Encounter: Payer: Self-pay | Admitting: Neurology

## 2013-06-30 ENCOUNTER — Ambulatory Visit (INDEPENDENT_AMBULATORY_CARE_PROVIDER_SITE_OTHER): Payer: Medicare Other | Admitting: Neurology

## 2013-06-30 VITALS — BP 122/76 | HR 65 | Temp 97.6°F | Ht 63.0 in | Wt 189.0 lb

## 2013-06-30 DIAGNOSIS — G609 Hereditary and idiopathic neuropathy, unspecified: Secondary | ICD-10-CM

## 2013-06-30 DIAGNOSIS — R209 Unspecified disturbances of skin sensation: Secondary | ICD-10-CM

## 2013-06-30 DIAGNOSIS — G629 Polyneuropathy, unspecified: Secondary | ICD-10-CM

## 2013-06-30 MED ORDER — NORTRIPTYLINE HCL 10 MG PO CAPS: 10.0000 mg | ORAL_CAPSULE | Freq: Every day | ORAL | Status: DC

## 2013-06-30 NOTE — Progress Notes (Signed)
[facility]  Follow-up Visit   Date: 06/30/2013    Tiffany Washington MRN: [id number] DOB: [birth year]   Interim History: Tiffany Washington is a 72 y.o. right-handed Caucasian female with history of GERD, osteoathritis, and restless leg syndrome returning to the clinic regarding bilateral leg achiness.   She reports that her tingling and achiness of her legs returned about 3 weeks ago.  She continues to get benefit with restful sleep with Lyrica, but no longer helps with her paresthesias.  She is taking Lyrica 50mg  BID.  She has been talking with friends/family and is very concerned about the side effects and wishes to stop it.  Further, she is asking whether she has fibromyalgia because of her generalized achiness and joint stiffness.  Pain is generalized and worse when she tries to get up and get going.  She applies an arthritis cream to her legs which helps the pain.   History of present illness: She reports having chronic leg pain for at least 20 years, which would occur intermittently, however over the past year symptoms have become more constant. Pain is described as "as if legs can't hold me up and achy". She has intermittent tingling and burning sensation from her knees down which started one-year ago. Symptoms are worse in the evening and with activity. She takes Aleve and motrin, which seems to help at times. She gets chiropractor manipulations once per month.  Her mood is good and she stays active. Her pain does not limit her daily activity but although it bothers her, she is able to work despite her discomfort. She has difficulty with putting her socks on or activities that requires her to bend over.   She takes pramipexole for the past 15 years for RLS which helps.  06/03/2013:  Significant benefit with Lyrica 75mg  daily and tingling almost resolved.  Sleeping much better through the night.   Medications:  Current Outpatient Prescriptions on File Prior to  Visit  Medication Sig Dispense Refill  . ESTRADIOL ACETATE PO Take 1 tablet by mouth at bedtime. TAke 1/2 tablet daily      . Multiple Vitamins-Minerals (MULTIVITAMIN WITH MINERALS) tablet Take 1 tablet by mouth daily. Comes in a daily package.  Takes one package a day       . omeprazole (PRILOSEC) 20 MG capsule Take 20 mg by mouth daily.        . pramipexole (MIRAPEX) 0.25 MG tablet Take 0.125-0.25 mg by mouth 2 (two) times daily. Takes 0.25 mg at 1600 and 0.125mg  at bedtime.      . sulindac (CLINORIL) 200 MG tablet Take 200 mg by mouth daily as needed (arthritis).        No current facility-administered medications on file prior to visit.    Allergies:  Allergies  Allergen Reactions  . Carbamazepine   . Gabapentin   . Indocin [Indomethacin]   . Meloxicam   . Ropinirole     Speed the legs up.  . Sulfa Antibiotics Diarrhea and Nausea And Vomiting  . Voltaren [Diclofenac Sodium]      Review of Systems:  CONSTITUTIONAL: No fevers, chills, night sweats, or weight loss.   EYES: No visual changes or eye pain ENT: No hearing changes.  No history of nose bleeds.   RESPIRATORY: No cough, wheezing and shortness of breath.   CARDIOVASCULAR: Negative for chest pain, and palpitations.   GI: Negative for abdominal discomfort, blood in stools or black stools.  No recent change in bowel habits.  GU:  No history of incontinence.   MUSCLOSKELETAL: No history of joint pain or swelling.  No myalgias.   SKIN: Negative for lesions, rash, and itching.   HEMATOLOGY/ONCOLOGY: Negative for prolonged bleeding, bruising easily, and swollen nodes.  No history of cancer.   ENDOCRINE: Negative for cold or heat intolerance, polydipsia or goiter.   PSYCH:  no depression or anxiety symptoms.   NEURO: As Above.   Vital Signs:  BP 122/76  Pulse 65  Temp(Src) 97.6 F (36.4 C) (Oral)  Ht 5\' 3"  (1.6 m)  Wt 189 lb (85.73 kg)  BMI 33.49 kg/m2   Neurological Exam: MENTAL STATUS including orientation to  time, place, person, recent and remote memory, attention span and concentration, language, and fund of knowledge is normal.  Speech is not dysarthric.  CRANIAL NERVES: III-IV-VI: Pupils equal round and reactive to light.  Normal conjugate, extra-ocular eye movements in all directions of gaze.  No nystagmus.     VII:  Normal facial symmetry and movements.   IX-X:  Normal palatal movement.   XI:  Normal shoulder shrug and head rotation.   XII:  Normal tongue strength and range of motion, no deviation or fasciculation.  MOTOR: Motor strength is 5/5 in all extremities.   No atrophy, fasciculations or abnormal movements.  No pronator drift.  Tone is normal. No tenderness to fibromylagia tender points.  MSRs:  Right                                                                 Left brachioradialis 2+  brachioradialis 2+  biceps 2+  biceps 2+  triceps 2+  triceps 2+  patellar 2+  patellar 2+  ankle jerk 1+  ankle jerk 1+   SENSORY: intact   COORDINATION/GAIT: Normal finger-to- nose-finger. Gait antalgic, but appears stable.    Data: Component     Latest Ref Rng 05/22/2013 05/27/2013  Glucose, Fasting     70 - 99 mg/dL  161 (H)  Glucose, GTT - 1 Hour       156  Glucose, 2 hour       117  Methylmalonic acid     0.3 - 1.9 mmol/mol creat  1.6  Creatinine     2.38 - 26.55 mmol/L  13.73  Vitamin B-12     211 - 911 pg/mL 1183 (H)   Copper     70 - 175 mcg/dL  93  Ceruloplasmin     20 - 60 mg/dL  28  Aldolase     <=0.9 U/L  6.8   Component     Latest Ref Rng 05/22/2013 06/03/2013  CK Total     7 - 177 U/L 313 (H) 163  CK, MB     0.0 - 5.0 ng/mL  3.6  Relative Index     0.0 - 4.0  2.2   IMPRESSION: 1.  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  - She had benefit with Lyrica, but is concerned about side effect profile, so wishes to stop it.  I  discussed that all medications have their own side effect profile. If there is no improvement with alternative medications,  Lyrica may need to be restarted going forward I will start her on nortriptyline.  TCAs as a class is flagged because of her CBZ allergy, but when I asked her about her allergy, she is unsure whether it is a true allergy vs medication intolerance vs ineffective medication.  No history of hives, anaphylaxis, or shortness of breath.  I will start nortriptyline at a low dose and asked her to be vigilant of any side effects.  2.  HyperCKemia - resolved  - CK 313 > 163  - Clinically, with no motor deficits so suspicion for myopathy is low  - Consider rechecking CK in 72-months based on symptoms   PLAN/RECOMMENDATIONS:  1.  Take Lyrica 50mg  at bedtime for one week, then STOP. 2.  Start taking nortriptyline 10mg  as follows:  Week 1:  One tablet at bedtime  Week 2:  Two tablets at bedtime  Week 3:  Three tablets at bedtime  Week 4:  4 tablets at bedtime 3.  Call the office in 4-weeks with clinical update.  If tolerating medication, will start in nortriptyline 50mg  tablets qhs. 4.  Return to clinic in 6-weeks   The duration of this appointment visit was 30 minutes of face-to-face time with the patient.  Greater than 50% of this time was spent in counseling, explanation of diagnosis, planning of further management, and coordination of care.   Thank you for allowing me to participate in patient's care.  If I can answer any additional questions, I would be pleased to do so.    Sincerely,    Arriyanna Mersch K. Allena Katz, DO

## 2013-06-30 NOTE — Progress Notes (Signed)
Faxed

## 2013-06-30 NOTE — Patient Instructions (Signed)
1.  Take Lyrica 50mg  at bedtime for one week, then STOP. 2.  Start taking nortriptyline 10mg  as follows:  Week 1:  One tablet at bedtime  Week 2:  Two tablets at bedtime  Week 3:  Three tablets at bedtime  Week 4:  4 tablets at bedtime 3.  Call the office in 4-weeks with clinical update 4.  Return to clinic in 6-weeks

## 2013-08-11 ENCOUNTER — Ambulatory Visit (INDEPENDENT_AMBULATORY_CARE_PROVIDER_SITE_OTHER): Payer: Medicare Other | Admitting: Neurology

## 2013-08-11 ENCOUNTER — Encounter: Payer: Self-pay | Admitting: Neurology

## 2013-08-11 VITALS — BP 138/78 | HR 68 | Resp 18 | Ht 64.0 in | Wt 194.6 lb

## 2013-08-11 DIAGNOSIS — G609 Hereditary and idiopathic neuropathy, unspecified: Secondary | ICD-10-CM

## 2013-08-11 DIAGNOSIS — G629 Polyneuropathy, unspecified: Secondary | ICD-10-CM

## 2013-08-11 DIAGNOSIS — R209 Unspecified disturbances of skin sensation: Secondary | ICD-10-CM

## 2013-08-11 NOTE — Patient Instructions (Signed)
1.  Continue Cymbalta.  Please call the office clarifying the dose of cymbalta 2.  Return to clinic 23-months

## 2013-08-11 NOTE — Progress Notes (Signed)
[facility]  Follow-up Visit   Date: 08/11/2013    Tiffany Washington MRN: [id number] DOB: 06-30-1941   Interim History: Tiffany Washington is a 73 y.o. right-handed Caucasian female with history of GERD, osteoathritis, and restless leg syndrome returning to the clinic regarding bilateral leg achiness.  She was last seen in the clinic 06/30/2013.  History of present illness: She reports having chronic leg pain for at least 20 years, which would occur intermittently, however over the past year symptoms have become more constant. Pain is described as "as if legs can't hold me up and achy". She has intermittent tingling and burning sensation from her knees down which started one-year ago. Symptoms are worse in the evening and with activity. She takes Aleve and motrin, which seems to help at times. She gets chiropractor manipulations once per month.  Her mood is good and she stays active. Her pain does not limit her daily activity but although it bothers her, she is able to work despite her discomfort. Of note, she takes pramipexole for the past 15 years for RLS which helps.  06/03/2013:  Significant benefit with Lyrica 75mg  daily and tingling almost resolved.  Sleeping much better through the night.    06/30/2013: She reports that her tingling and achiness of her legs returned about 3 weeks ago.  She was taking Lyrica 50mg  BID, but stopped it after reading about side effect profile.  For her symptoms, nortriptyline was started.  Interval history:  She developed insomnia on nortriptyline so stopped it.  She was recently started Cymbalta 30mg  by her rheumatologist which has significantly helped her leg achiness, so is feeling better.  No new symptoms.   Medications:  Current Outpatient Prescriptions on File Prior to Visit  Medication Sig Dispense Refill  . ESTRADIOL ACETATE PO Take 1 tablet by mouth at bedtime. TAke 1/2 tablet daily      . Multiple Vitamins-Minerals  (MULTIVITAMIN WITH MINERALS) tablet Take 1 tablet by mouth daily. Comes in a daily package.  Takes one package a day       . omeprazole (PRILOSEC) 20 MG capsule Take 20 mg by mouth daily.        . pramipexole (MIRAPEX) 0.25 MG tablet Take 0.125-0.25 mg by mouth 2 (two) times daily. Takes 0.25 mg at 1600 and 0.125mg  at bedtime.      . sulindac (CLINORIL) 200 MG tablet Take 200 mg by mouth daily as needed (arthritis).        No current facility-administered medications on file prior to visit.    Allergies:  Allergies  Allergen Reactions  . Carbamazepine   . Gabapentin   . Indocin [Indomethacin]   . Meloxicam   . Pamelor [Nortriptyline Hcl]     insomnia  . Ropinirole     Speed the legs up.  . Sulfa Antibiotics Diarrhea and Nausea And Vomiting  . Voltaren [Diclofenac Sodium]      Review of Systems:  CONSTITUTIONAL: No fevers, chills, night sweats, or weight loss.   EYES: No visual changes or eye pain ENT: No hearing changes.  No history of nose bleeds.   RESPIRATORY: No cough, wheezing and shortness of breath.   CARDIOVASCULAR: Negative for chest pain, and palpitations.   GI: Negative for abdominal discomfort, blood in stools or black stools.  No recent change in bowel habits.   GU:  No history of incontinence.   MUSCLOSKELETAL: No history of joint pain or swelling.  No myalgias.   SKIN: Negative for  lesions, rash, and itching.   HEMATOLOGY/ONCOLOGY: Negative for prolonged bleeding, bruising easily, and swollen nodes.    ENDOCRINE: Negative for cold or heat intolerance, polydipsia or goiter.   PSYCH:  no depression or anxiety symptoms.   NEURO: As Above.   Vital Signs:  BP 138/78  Pulse 68  Resp 18  Ht 5\' 4"  (1.626 m)  Wt 194 lb 9 oz (88.253 kg)  BMI 33.38 kg/m2   Neurological Exam: MENTAL STATUS including orientation to time, place, person, recent and remote memory, attention span and concentration, language, and fund of knowledge is normal.  Speech is not  dysarthric.  CRANIAL NERVES: Pupils equal round and reactive to light.  Normal conjugate, extra-ocular eye movements in all directions of gaze.  Normal facial symmetry and movements.    MOTOR: Motor strength is 5/5 in all extremities.   No atrophy, fasciculations or abnormal movements.  No pronator drift.  Tone is normal. No tenderness to fibromylagia tender points.  MSRs:  Right                                                                 Left brachioradialis 2+  brachioradialis 2+  biceps 2+  biceps 2+  triceps 2+  triceps 2+  patellar 2+  patellar 2+  ankle jerk 1+  ankle jerk 1+   SENSORY: intact   COORDINATION/GAIT: Normal finger-to- nose-finger. Gait antalgic, but appears stable.    Data: Component     Latest Ref Rng 05/22/2013 05/27/2013  Glucose, Fasting     70 - 99 mg/dL  100 (H)  Glucose, GTT - 1 Hour       156  Glucose, 2 hour       117  Methylmalonic acid     0.3 - 1.9 mmol/mol creat  1.6  Creatinine     2.38 - 26.55 mmol/L  13.73  Vitamin B-12     211 - 911 pg/mL 1183 (H)   Copper     70 - 175 mcg/dL  93  Ceruloplasmin     20 - 60 mg/dL  28  Aldolase     <=8.1 U/L  6.8   Component     Latest Ref Rng 05/22/2013 06/03/2013  CK Total     7 - 177 U/L 313 (H) 163  CK, MB     0.0 - 5.0 ng/mL  3.6  Relative Index     0.0 - 4.0  2.2    IMPRESSION/PLAN: 1.  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  - She had benefit with Lyrica, but due to concerns about side effect profile, she stopped it.   - Recently started on cymbalta 20mg  which has helped.  This can be can titrate further as needed 2.  HyperCKemia - resolved  - CK 313 > 163  - Clinically, with no motor deficits so suspicion for myopathy is low  - Consider rechecking CK at next visit based on symptoms 3.  Return to clinic in 29-months,  or sooner as needed.   The duration of this appointment visit was 30 minutes of face-to-face time with the patient.  Greater than  50% of this time was spent in counseling, explanation of diagnosis, planning of further management, and coordination of care.   Thank you for allowing me to participate in patient's care.  If I can answer any additional questions, I would be pleased to do so.    Sincerely,    Donika K. Posey Pronto, DO

## 2013-10-01 ENCOUNTER — Encounter (INDEPENDENT_AMBULATORY_CARE_PROVIDER_SITE_OTHER): Payer: Self-pay

## 2013-10-01 ENCOUNTER — Other Ambulatory Visit (HOSPITAL_COMMUNITY): Payer: Self-pay | Admitting: Internal Medicine

## 2013-10-01 ENCOUNTER — Ambulatory Visit (HOSPITAL_COMMUNITY)
Admission: RE | Admit: 2013-10-01 | Discharge: 2013-10-01 | Disposition: A | Discharge status: Home / Self Care | Payer: Medicare Other | Source: Ambulatory Visit | Attending: Vascular Surgery | Admitting: Vascular Surgery

## 2013-10-01 DIAGNOSIS — R609 Edema, unspecified: Secondary | ICD-10-CM

## 2013-10-08 ENCOUNTER — Other Ambulatory Visit: Payer: Self-pay

## 2013-10-08 ENCOUNTER — Other Ambulatory Visit (HOSPITAL_COMMUNITY): Payer: Self-pay | Admitting: *Deleted

## 2013-10-08 ENCOUNTER — Ambulatory Visit (HOSPITAL_COMMUNITY): Payer: Medicare Other | Attending: Cardiovascular Disease | Admitting: *Deleted

## 2013-10-08 ENCOUNTER — Encounter: Payer: Self-pay | Admitting: Cardiovascular Disease

## 2013-10-08 DIAGNOSIS — R609 Edema, unspecified: Secondary | ICD-10-CM

## 2013-10-08 DIAGNOSIS — R011 Cardiac murmur, unspecified: Secondary | ICD-10-CM

## 2013-10-08 NOTE — Progress Notes (Signed)
Echo performed. 

## 2014-01-21 ENCOUNTER — Other Ambulatory Visit (HOSPITAL_COMMUNITY): Payer: Self-pay | Admitting: Internal Medicine

## 2014-01-21 ENCOUNTER — Ambulatory Visit (HOSPITAL_COMMUNITY)
Admission: RE | Admit: 2014-01-21 | Discharge: 2014-01-21 | Disposition: A | Discharge status: Home / Self Care | Payer: Medicare Other | Source: Ambulatory Visit | Attending: Vascular Surgery | Admitting: Vascular Surgery

## 2014-01-21 DIAGNOSIS — R609 Edema, unspecified: Secondary | ICD-10-CM

## 2014-01-28 ENCOUNTER — Encounter (HOSPITAL_COMMUNITY): Payer: Self-pay | Admitting: Emergency Medicine

## 2014-01-28 ENCOUNTER — Emergency Department (HOSPITAL_COMMUNITY)
Admission: EM | Admit: 2014-01-28 | Discharge: 2014-01-28 | Disposition: A | Discharge status: Home / Self Care | Payer: Medicare Other | Attending: Emergency Medicine | Admitting: Emergency Medicine

## 2014-01-28 DIAGNOSIS — Z8739 Personal history of other diseases of the musculoskeletal system and connective tissue: Secondary | ICD-10-CM | POA: Insufficient documentation

## 2014-01-28 DIAGNOSIS — M7120 Synovial cyst of popliteal space [Baker], unspecified knee: Secondary | ICD-10-CM | POA: Insufficient documentation

## 2014-01-28 DIAGNOSIS — K219 Gastro-esophageal reflux disease without esophagitis: Secondary | ICD-10-CM | POA: Insufficient documentation

## 2014-01-28 DIAGNOSIS — Z79899 Other long term (current) drug therapy: Secondary | ICD-10-CM | POA: Insufficient documentation

## 2014-01-28 DIAGNOSIS — M25569 Pain in unspecified knee: Secondary | ICD-10-CM | POA: Insufficient documentation

## 2014-01-28 DIAGNOSIS — Z85820 Personal history of malignant melanoma of skin: Secondary | ICD-10-CM | POA: Insufficient documentation

## 2014-01-28 DIAGNOSIS — G2581 Restless legs syndrome: Secondary | ICD-10-CM | POA: Insufficient documentation

## 2014-01-28 DIAGNOSIS — M25562 Pain in left knee: Secondary | ICD-10-CM

## 2014-01-28 DIAGNOSIS — M7122 Synovial cyst of popliteal space [Baker], left knee: Secondary | ICD-10-CM

## 2014-01-28 MED ORDER — OXYCODONE-ACETAMINOPHEN 5-325 MG PO TABS: ORAL_TABLET | ORAL | Status: DC

## 2014-01-28 MED ORDER — IBUPROFEN 400 MG PO TABS
600.0000 mg | ORAL_TABLET | Freq: Once | ORAL | Status: AC
  Administered 2014-01-28: 600 mg via ORAL
  Filled 2014-01-28 (×2): qty 1

## 2014-01-28 MED ORDER — OXYCODONE-ACETAMINOPHEN 5-325 MG PO TABS
1.0000 | ORAL_TABLET | Freq: Once | ORAL | Status: AC
  Administered 2014-01-28: 1 via ORAL
  Filled 2014-01-28: qty 1

## 2014-01-28 NOTE — ED Notes (Signed)
Pt in stating she was dx with a baker's cyst behind her left knee, seen by MD Donne Hazel for this, today pt noted a sudden increase in pain and the pain is making it hard for her to walk, reports swelling below knee but this is not new since diagnosis

## 2014-01-28 NOTE — ED Provider Notes (Signed)
CSN: [id number]     Arrival date & time 01/28/14  1500 History   First MD Initiated Contact with Patient 01/28/14 1831     Chief Complaint  Patient presents with  . Knee Pain     (Consider location/radiation/quality/duration/timing/severity/associated sxs/prior Treatment) HPI Pt is a 73yo female presenting to ED c/o gradually worsening left knee and left lower leg pain. Reports being dx on 01/21/14 via venous US and x-ray of left knee with a Baker's cyst.  Pt did not have evidence of a DVT.  Pt states pain worsened today while walking out of church around 1PM.  Pain is constant, aching, throbbing, 9/10, worse with movement and ambulation. Denies falls or new trauma. Denies fever, n/v/d. Denies numbness or tingling in left lower leg.  Pt has not been taking anything for pain.  States her PCP, Dr. Lelon Frohlich stated her knee looked "great" based on the plain films.  Denies hx of DVT or trauma to knee.   Past Medical History  Diagnosis Date  . Arthritis   . GERD (gastroesophageal reflux disease)     mild  . Restless leg syndrome   . 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 Vaginal hysterectomy    . Cesarean section    . Colonoscopy    . Polypectomy    . Upper gastrointestinal endoscopy     Family History  Problem Relation Age of Onset  . Hypertension Mother   . Congestive Heart Failure Mother     Died, 72  . Pulmonary embolism Father     Died, 81  . Diabetes Brother   . Diabetes Sister   . Gout Son    History  Substance Use Topics  . Smoking status: Never Smoker   . Smokeless tobacco: Never Used  . Alcohol Use: No   OB History   Grav Para Term Preterm Abortions TAB SAB Ect Mult Living                 Review of Systems  Constitutional: Negative for fever and chills.  Musculoskeletal: Positive for arthralgias, gait problem, joint swelling and myalgias.       Left knee swelling and lower leg pain  Neurological:  Negative for weakness and numbness.  All other systems reviewed and are negative.     Allergies  Carbamazepine; Gabapentin; Indocin; Meloxicam; Pamelor; Ropinirole; Sulfa antibiotics; and Voltaren  Home Medications   Prior to Admission medications   Medication Sig Start Date End Date Taking? Authorizing Provider  ESTRADIOL ACETATE PO Take 1 tablet by mouth at bedtime. TAke 1/2 tablet daily   Yes Historical Provider, MD  Multiple Vitamins-Minerals (MULTIVITAMIN WITH MINERALS) tablet Take 1 tablet by mouth daily. Comes in a daily package.  Takes one package a day    Yes Historical Provider, MD  omeprazole (PRILOSEC) 20 MG capsule Take 20 mg by mouth daily.     Yes Historical Provider, MD  pramipexole (MIRAPEX) 0.25 MG tablet Take 0.125-0.25 mg by mouth 2 (two) times daily. Takes 0.25 mg at 1600 and 0.125mg  at bedtime.   Yes Historical Provider, MD  oxyCODONE-acetaminophen (PERCOCET/ROXICET) 5-325 MG per tablet Take 1-2 pills every 4-6 hours as needed for pain. 01/28/14   Noland Fordyce, PA-C   BP 133/75  Pulse 83  Temp(Src) 97.5 F (36.4 C) (Oral)  Resp 12  Ht 5\' 4"  (1.626 m)  Wt 182 lb (82.555 kg)  BMI 31.22 kg/m2  SpO2 100%  Physical Exam  Nursing note and vitals reviewed. Constitutional: She is oriented to person, place, and time. She appears well-developed and well-nourished.  HENT:  Head: Normocephalic and atraumatic.  Eyes: EOM are normal.  Neck: Normal range of motion.  Cardiovascular: Normal rate, regular rhythm and normal heart sounds.   Pulses:      Dorsalis pedis pulses are 2+ on the left side.       Posterior tibial pulses are 2+ on the left side.  Pulmonary/Chest: Effort normal and breath sounds normal. No respiratory distress. She has no wheezes. She has no rales. She exhibits no tenderness.  Musculoskeletal: She exhibits edema ( 1+ to 2+ pitting, left lower leg) and tenderness.  Left knee: hesitant to move beyond 90 degrees with flexion or extension due to pain.   Tenderness circumferentially of left knee and lower leg.  FROM left hip and left ankle. 4/5 strength plantarflexion and dorsiflexion due to pain in left foot vs right.    Neurological: She is alert and oriented to person, place, and time.  Sensation in tact left lower leg.  Skin: Skin is warm and dry. No erythema.  Psychiatric: She has a normal mood and affect. Her behavior is normal.    ED Course  Procedures (including critical care time) Labs Review Labs Reviewed - No data to display  Imaging Review No results found.   EKG Interpretation None      MDM   Final diagnoses:  Left knee pain  Baker's cyst of knee, left    Pt is a 73yo female c/o left knee and lower leg pain with recent dx of Baker's Cyst via venous US on 01/21/14.  Reports hx of "normal" plain films of knee on same day.  Denies trauma. Denies fever, n/v/d. Left leg is neurovascularly in tact. Discussed pt with Dr. Vanita Panda, no indication for additional imaging at thsi time. Not concerned for compartment syndrome. Will tx symptomatically for pain. RICE tx discussed.  Advised to f/u with PCP and orthopedist for further evaluation and tx of knee pain.      Noland Fordyce, PA-C 01/28/14 2015

## 2014-01-28 NOTE — Discharge Instructions (Signed)
Take percocet as needed for severe pain. Do not drive while taking as it may cause drowsiness.  You should also take an antiinflammatory medication such as ibuprofen (motrin) to help with swelling.  Please elevate your leg. You may alternate ice and heat for comfort. Follow up with a primary care provider as well as orthopedist for further evaluation and treatment of left knee pain. See below for further instructions.   Baker Cyst A Baker cyst is a sac-like structure that forms in the back of the knee. It is filled with the same fluid that is located in your knee. This fluid lubricates the bones and cartilage of the knee and allows them to move over each other more easily. CAUSES  When the knee becomes injured or inflamed, increased fluid forms in the knee. When this happens, the joint lining is pushed out behind the knee and forms the Baker cyst. This cyst may also be caused by inflammation from arthritic conditions and infections. SIGNS AND SYMPTOMS  A Baker cyst usually has no symptoms. When the cyst is substantially enlarged:  You may feel pressure behind the knee, stiffness in the knee, or a mass in the area behind the knee.  You may develop pain, redness, and swelling in the calf. This can suggest a blood clot and requires evaluation by your health care provider. DIAGNOSIS  A Baker cyst is most often found during an ultrasound exam. This exam may have been performed for other reasons, and the cyst was found incidentally. Sometimes an MRI is used. This picks up other problems within a joint that an ultrasound exam may not. If the Baker cyst developed immediately after an injury, X-ray exams may be used to diagnose the cyst. TREATMENT  The treatment depends on the cause of the cyst. Anti-inflammatory medicines and rest often will be prescribed. If the cyst is caused by a bacterial infection, antibiotic medicines may be prescribed.  HOME CARE INSTRUCTIONS   If the cyst was caused by an injury,  for the first 24 hours, keep the injured leg elevated on 2 pillows while lying down.  For the first 24 hours while you are awake, apply ice to the injured area:  Put ice in a plastic bag.  Place a towel between your skin and the bag.  Leave the ice on for 20 minutes, 2-3 times a day.  Only take over-the-counter or prescription medicines for pain, discomfort, or fever as directed by your health care provider.  Only take antibiotic medicine as directed. Make sure to finish it even if you start to feel better. MAKE SURE YOU:   Understand these instructions.  Will watch your condition.  Will get help right away if you are not doing well or get worse. Document Released: 07/17/2005 Document Revised: 05/07/2013 Document Reviewed: 02/26/2013 [facility] Patient Information 2015 [place], [place]. This information is not intended to replace advice given to you by your health care provider. Make sure you discuss any questions you have with your health care provider.

## 2014-01-30 NOTE — ED Provider Notes (Signed)
  Medical screening examination/treatment/procedure(s) were performed by non-physician practitioner and as supervising physician I was immediately available for consultation/collaboration.   EKG Interpretation None         Carmin Muskrat, MD 01/30/14 1739

## 2014-02-09 ENCOUNTER — Ambulatory Visit: Payer: Medicare Other | Admitting: Neurology

## 2014-07-17 ENCOUNTER — Other Ambulatory Visit: Payer: Self-pay | Admitting: Dermatology

## 2014-12-24 ENCOUNTER — Other Ambulatory Visit: Payer: Self-pay | Admitting: Orthopaedic Surgery

## 2014-12-24 DIAGNOSIS — M25561 Pain in right knee: Secondary | ICD-10-CM

## 2015-01-01 ENCOUNTER — Ambulatory Visit
Admission: RE | Admit: 2015-01-01 | Discharge: 2015-01-01 | Disposition: A | Discharge status: Home / Self Care | Payer: PPO | Source: Ambulatory Visit | Attending: Orthopaedic Surgery | Admitting: Orthopaedic Surgery

## 2015-01-01 DIAGNOSIS — M25561 Pain in right knee: Secondary | ICD-10-CM

## 2015-08-11 DIAGNOSIS — R5382 Chronic fatigue, unspecified: Secondary | ICD-10-CM | POA: Diagnosis not present

## 2015-08-11 DIAGNOSIS — R7989 Other specified abnormal findings of blood chemistry: Secondary | ICD-10-CM | POA: Diagnosis not present

## 2015-08-11 DIAGNOSIS — R109 Unspecified abdominal pain: Secondary | ICD-10-CM | POA: Diagnosis not present

## 2015-08-11 DIAGNOSIS — M2550 Pain in unspecified joint: Secondary | ICD-10-CM | POA: Diagnosis not present

## 2015-08-11 DIAGNOSIS — E349 Endocrine disorder, unspecified: Secondary | ICD-10-CM | POA: Diagnosis not present

## 2015-08-11 DIAGNOSIS — E559 Vitamin D deficiency, unspecified: Secondary | ICD-10-CM | POA: Diagnosis not present

## 2015-08-11 DIAGNOSIS — M545 Low back pain: Secondary | ICD-10-CM | POA: Diagnosis not present

## 2015-08-11 DIAGNOSIS — R799 Abnormal finding of blood chemistry, unspecified: Secondary | ICD-10-CM | POA: Diagnosis not present

## 2015-08-11 DIAGNOSIS — E279 Disorder of adrenal gland, unspecified: Secondary | ICD-10-CM | POA: Diagnosis not present

## 2015-08-11 DIAGNOSIS — E538 Deficiency of other specified B group vitamins: Secondary | ICD-10-CM | POA: Diagnosis not present

## 2015-09-15 DIAGNOSIS — M109 Gout, unspecified: Secondary | ICD-10-CM | POA: Diagnosis not present

## 2015-09-15 DIAGNOSIS — E785 Hyperlipidemia, unspecified: Secondary | ICD-10-CM | POA: Diagnosis not present

## 2015-09-15 DIAGNOSIS — R5382 Chronic fatigue, unspecified: Secondary | ICD-10-CM | POA: Diagnosis not present

## 2015-09-15 DIAGNOSIS — R7989 Other specified abnormal findings of blood chemistry: Secondary | ICD-10-CM | POA: Diagnosis not present

## 2015-10-21 DIAGNOSIS — E7800 Pure hypercholesterolemia, unspecified: Secondary | ICD-10-CM | POA: Diagnosis not present

## 2015-10-21 DIAGNOSIS — R7989 Other specified abnormal findings of blood chemistry: Secondary | ICD-10-CM | POA: Diagnosis not present

## 2015-10-21 DIAGNOSIS — R5382 Chronic fatigue, unspecified: Secondary | ICD-10-CM | POA: Diagnosis not present

## 2015-10-21 DIAGNOSIS — M109 Gout, unspecified: Secondary | ICD-10-CM | POA: Diagnosis not present

## 2015-10-21 DIAGNOSIS — E785 Hyperlipidemia, unspecified: Secondary | ICD-10-CM | POA: Diagnosis not present

## 2015-12-08 DIAGNOSIS — R5382 Chronic fatigue, unspecified: Secondary | ICD-10-CM | POA: Diagnosis not present

## 2015-12-08 DIAGNOSIS — M5440 Lumbago with sciatica, unspecified side: Secondary | ICD-10-CM | POA: Diagnosis not present

## 2015-12-08 DIAGNOSIS — R7989 Other specified abnormal findings of blood chemistry: Secondary | ICD-10-CM | POA: Diagnosis not present

## 2015-12-09 DIAGNOSIS — Z1231 Encounter for screening mammogram for malignant neoplasm of breast: Secondary | ICD-10-CM | POA: Diagnosis not present

## 2015-12-09 DIAGNOSIS — N390 Urinary tract infection, site not specified: Secondary | ICD-10-CM | POA: Diagnosis not present

## 2015-12-09 DIAGNOSIS — Z6827 Body mass index (BMI) 27.0-27.9, adult: Secondary | ICD-10-CM | POA: Diagnosis not present

## 2015-12-09 DIAGNOSIS — Z01419 Encounter for gynecological examination (general) (routine) without abnormal findings: Secondary | ICD-10-CM | POA: Diagnosis not present

## 2016-02-16 DIAGNOSIS — R7989 Other specified abnormal findings of blood chemistry: Secondary | ICD-10-CM | POA: Diagnosis not present

## 2016-02-16 DIAGNOSIS — M545 Low back pain: Secondary | ICD-10-CM | POA: Diagnosis not present

## 2016-02-16 DIAGNOSIS — R5382 Chronic fatigue, unspecified: Secondary | ICD-10-CM | POA: Diagnosis not present

## 2016-02-16 DIAGNOSIS — E559 Vitamin D deficiency, unspecified: Secondary | ICD-10-CM | POA: Diagnosis not present

## 2016-03-10 DIAGNOSIS — J3089 Other allergic rhinitis: Secondary | ICD-10-CM | POA: Diagnosis not present

## 2016-03-10 DIAGNOSIS — J301 Allergic rhinitis due to pollen: Secondary | ICD-10-CM | POA: Diagnosis not present

## 2016-03-10 DIAGNOSIS — R21 Rash and other nonspecific skin eruption: Secondary | ICD-10-CM | POA: Diagnosis not present

## 2016-03-10 DIAGNOSIS — H1045 Other chronic allergic conjunctivitis: Secondary | ICD-10-CM | POA: Diagnosis not present

## 2016-03-29 DIAGNOSIS — R7989 Other specified abnormal findings of blood chemistry: Secondary | ICD-10-CM | POA: Diagnosis not present

## 2016-03-29 DIAGNOSIS — M109 Gout, unspecified: Secondary | ICD-10-CM | POA: Diagnosis not present

## 2016-05-22 DIAGNOSIS — E559 Vitamin D deficiency, unspecified: Secondary | ICD-10-CM | POA: Diagnosis not present

## 2016-05-22 DIAGNOSIS — N390 Urinary tract infection, site not specified: Secondary | ICD-10-CM | POA: Diagnosis not present

## 2016-05-22 DIAGNOSIS — L821 Other seborrheic keratosis: Secondary | ICD-10-CM | POA: Diagnosis not present

## 2016-05-22 DIAGNOSIS — R8299 Other abnormal findings in urine: Secondary | ICD-10-CM | POA: Diagnosis not present

## 2016-05-22 DIAGNOSIS — Z85820 Personal history of malignant melanoma of skin: Secondary | ICD-10-CM | POA: Diagnosis not present

## 2016-05-22 DIAGNOSIS — E784 Other hyperlipidemia: Secondary | ICD-10-CM | POA: Diagnosis not present

## 2016-05-22 DIAGNOSIS — L720 Epidermal cyst: Secondary | ICD-10-CM | POA: Diagnosis not present

## 2016-05-29 DIAGNOSIS — E790 Hyperuricemia without signs of inflammatory arthritis and tophaceous disease: Secondary | ICD-10-CM | POA: Diagnosis not present

## 2016-05-29 DIAGNOSIS — M797 Fibromyalgia: Secondary | ICD-10-CM | POA: Diagnosis not present

## 2016-05-29 DIAGNOSIS — Z1389 Encounter for screening for other disorder: Secondary | ICD-10-CM | POA: Diagnosis not present

## 2016-05-29 DIAGNOSIS — N951 Menopausal and female climacteric states: Secondary | ICD-10-CM | POA: Diagnosis not present

## 2016-05-29 DIAGNOSIS — E784 Other hyperlipidemia: Secondary | ICD-10-CM | POA: Diagnosis not present

## 2016-05-29 DIAGNOSIS — M1990 Unspecified osteoarthritis, unspecified site: Secondary | ICD-10-CM | POA: Diagnosis not present

## 2016-05-29 DIAGNOSIS — G2581 Restless legs syndrome: Secondary | ICD-10-CM | POA: Diagnosis not present

## 2016-05-29 DIAGNOSIS — H9190 Unspecified hearing loss, unspecified ear: Secondary | ICD-10-CM | POA: Diagnosis not present

## 2016-05-29 DIAGNOSIS — Z6829 Body mass index (BMI) 29.0-29.9, adult: Secondary | ICD-10-CM | POA: Diagnosis not present

## 2016-05-29 DIAGNOSIS — N329 Bladder disorder, unspecified: Secondary | ICD-10-CM | POA: Diagnosis not present

## 2016-05-29 DIAGNOSIS — I493 Ventricular premature depolarization: Secondary | ICD-10-CM | POA: Diagnosis not present

## 2016-05-29 DIAGNOSIS — Z0000 Encounter for general adult medical examination without abnormal findings: Secondary | ICD-10-CM | POA: Diagnosis not present

## 2016-06-02 DIAGNOSIS — Z1212 Encounter for screening for malignant neoplasm of rectum: Secondary | ICD-10-CM | POA: Diagnosis not present

## 2016-06-09 DIAGNOSIS — D2312 Other benign neoplasm of skin of left eyelid, including canthus: Secondary | ICD-10-CM | POA: Diagnosis not present

## 2016-12-11 DIAGNOSIS — N760 Acute vaginitis: Secondary | ICD-10-CM | POA: Diagnosis not present

## 2016-12-11 DIAGNOSIS — Z124 Encounter for screening for malignant neoplasm of cervix: Secondary | ICD-10-CM | POA: Diagnosis not present

## 2016-12-11 DIAGNOSIS — Z1272 Encounter for screening for malignant neoplasm of vagina: Secondary | ICD-10-CM | POA: Diagnosis not present

## 2016-12-11 DIAGNOSIS — Z1231 Encounter for screening mammogram for malignant neoplasm of breast: Secondary | ICD-10-CM | POA: Diagnosis not present

## 2016-12-11 DIAGNOSIS — R87615 Unsatisfactory cytologic smear of cervix: Secondary | ICD-10-CM | POA: Diagnosis not present

## 2016-12-11 DIAGNOSIS — Z6833 Body mass index (BMI) 33.0-33.9, adult: Secondary | ICD-10-CM | POA: Diagnosis not present

## 2016-12-19 DIAGNOSIS — H524 Presbyopia: Secondary | ICD-10-CM | POA: Diagnosis not present

## 2016-12-19 DIAGNOSIS — H52223 Regular astigmatism, bilateral: Secondary | ICD-10-CM | POA: Diagnosis not present

## 2016-12-19 DIAGNOSIS — H02403 Unspecified ptosis of bilateral eyelids: Secondary | ICD-10-CM | POA: Diagnosis not present

## 2016-12-19 DIAGNOSIS — H18053 Posterior corneal pigmentations, bilateral: Secondary | ICD-10-CM | POA: Diagnosis not present

## 2016-12-19 DIAGNOSIS — H353110 Nonexudative age-related macular degeneration, right eye, stage unspecified: Secondary | ICD-10-CM | POA: Diagnosis not present

## 2016-12-19 DIAGNOSIS — H5203 Hypermetropia, bilateral: Secondary | ICD-10-CM | POA: Diagnosis not present

## 2016-12-19 DIAGNOSIS — H25813 Combined forms of age-related cataract, bilateral: Secondary | ICD-10-CM | POA: Diagnosis not present

## 2016-12-19 DIAGNOSIS — H0234 Blepharochalasis left upper eyelid: Secondary | ICD-10-CM | POA: Diagnosis not present

## 2016-12-19 DIAGNOSIS — H0231 Blepharochalasis right upper eyelid: Secondary | ICD-10-CM | POA: Diagnosis not present

## 2016-12-19 DIAGNOSIS — M5489 Other dorsalgia: Secondary | ICD-10-CM | POA: Diagnosis not present

## 2016-12-19 DIAGNOSIS — Z6833 Body mass index (BMI) 33.0-33.9, adult: Secondary | ICD-10-CM | POA: Diagnosis not present

## 2017-05-29 DIAGNOSIS — E559 Vitamin D deficiency, unspecified: Secondary | ICD-10-CM | POA: Diagnosis not present

## 2017-05-29 DIAGNOSIS — E7849 Other hyperlipidemia: Secondary | ICD-10-CM | POA: Diagnosis not present

## 2017-05-29 DIAGNOSIS — R82998 Other abnormal findings in urine: Secondary | ICD-10-CM | POA: Diagnosis not present

## 2017-05-29 DIAGNOSIS — E790 Hyperuricemia without signs of inflammatory arthritis and tophaceous disease: Secondary | ICD-10-CM | POA: Diagnosis not present

## 2017-05-29 DIAGNOSIS — R946 Abnormal results of thyroid function studies: Secondary | ICD-10-CM | POA: Diagnosis not present

## 2017-06-05 DIAGNOSIS — E559 Vitamin D deficiency, unspecified: Secondary | ICD-10-CM | POA: Diagnosis not present

## 2017-06-05 DIAGNOSIS — I493 Ventricular premature depolarization: Secondary | ICD-10-CM | POA: Diagnosis not present

## 2017-06-05 DIAGNOSIS — G2581 Restless legs syndrome: Secondary | ICD-10-CM | POA: Diagnosis not present

## 2017-06-05 DIAGNOSIS — Z1389 Encounter for screening for other disorder: Secondary | ICD-10-CM | POA: Diagnosis not present

## 2017-06-05 DIAGNOSIS — N951 Menopausal and female climacteric states: Secondary | ICD-10-CM | POA: Diagnosis not present

## 2017-06-05 DIAGNOSIS — Z0000 Encounter for general adult medical examination without abnormal findings: Secondary | ICD-10-CM | POA: Diagnosis not present

## 2017-06-05 DIAGNOSIS — Z6833 Body mass index (BMI) 33.0-33.9, adult: Secondary | ICD-10-CM | POA: Diagnosis not present

## 2017-06-05 DIAGNOSIS — E7849 Other hyperlipidemia: Secondary | ICD-10-CM | POA: Diagnosis not present

## 2017-06-05 DIAGNOSIS — G608 Other hereditary and idiopathic neuropathies: Secondary | ICD-10-CM | POA: Diagnosis not present

## 2017-06-05 DIAGNOSIS — M19042 Primary osteoarthritis, left hand: Secondary | ICD-10-CM | POA: Diagnosis not present

## 2017-06-05 DIAGNOSIS — M797 Fibromyalgia: Secondary | ICD-10-CM | POA: Diagnosis not present

## 2017-06-06 DIAGNOSIS — Z1212 Encounter for screening for malignant neoplasm of rectum: Secondary | ICD-10-CM | POA: Diagnosis not present

## 2017-07-30 DIAGNOSIS — R05 Cough: Secondary | ICD-10-CM | POA: Diagnosis not present

## 2017-07-30 DIAGNOSIS — J40 Bronchitis, not specified as acute or chronic: Secondary | ICD-10-CM | POA: Diagnosis not present

## 2017-07-30 DIAGNOSIS — Z6832 Body mass index (BMI) 32.0-32.9, adult: Secondary | ICD-10-CM | POA: Diagnosis not present

## 2017-09-03 DIAGNOSIS — M79645 Pain in left finger(s): Secondary | ICD-10-CM | POA: Diagnosis not present

## 2017-09-03 DIAGNOSIS — M25532 Pain in left wrist: Secondary | ICD-10-CM | POA: Diagnosis not present

## 2017-09-03 DIAGNOSIS — M189 Osteoarthritis of first carpometacarpal joint, unspecified: Secondary | ICD-10-CM | POA: Diagnosis not present

## 2017-09-17 DIAGNOSIS — D23122 Other benign neoplasm of skin of left lower eyelid, including canthus: Secondary | ICD-10-CM | POA: Diagnosis not present

## 2017-09-17 DIAGNOSIS — Z85820 Personal history of malignant melanoma of skin: Secondary | ICD-10-CM | POA: Diagnosis not present

## 2017-11-27 DIAGNOSIS — R109 Unspecified abdominal pain: Secondary | ICD-10-CM | POA: Diagnosis not present

## 2017-11-27 DIAGNOSIS — R5382 Chronic fatigue, unspecified: Secondary | ICD-10-CM | POA: Diagnosis not present

## 2017-11-27 DIAGNOSIS — M545 Low back pain: Secondary | ICD-10-CM | POA: Diagnosis not present

## 2017-11-27 DIAGNOSIS — E538 Deficiency of other specified B group vitamins: Secondary | ICD-10-CM | POA: Diagnosis not present

## 2017-11-27 DIAGNOSIS — R7989 Other specified abnormal findings of blood chemistry: Secondary | ICD-10-CM | POA: Diagnosis not present

## 2017-11-27 DIAGNOSIS — E559 Vitamin D deficiency, unspecified: Secondary | ICD-10-CM | POA: Diagnosis not present

## 2017-11-27 DIAGNOSIS — E785 Hyperlipidemia, unspecified: Secondary | ICD-10-CM | POA: Diagnosis not present

## 2017-12-13 DIAGNOSIS — Z6833 Body mass index (BMI) 33.0-33.9, adult: Secondary | ICD-10-CM | POA: Diagnosis not present

## 2017-12-13 DIAGNOSIS — Z01419 Encounter for gynecological examination (general) (routine) without abnormal findings: Secondary | ICD-10-CM | POA: Diagnosis not present

## 2017-12-13 DIAGNOSIS — Z1231 Encounter for screening mammogram for malignant neoplasm of breast: Secondary | ICD-10-CM | POA: Diagnosis not present

## 2017-12-25 DIAGNOSIS — H25813 Combined forms of age-related cataract, bilateral: Secondary | ICD-10-CM | POA: Diagnosis not present

## 2017-12-25 DIAGNOSIS — H02403 Unspecified ptosis of bilateral eyelids: Secondary | ICD-10-CM | POA: Diagnosis not present

## 2017-12-25 DIAGNOSIS — H353110 Nonexudative age-related macular degeneration, right eye, stage unspecified: Secondary | ICD-10-CM | POA: Diagnosis not present

## 2017-12-25 DIAGNOSIS — H52223 Regular astigmatism, bilateral: Secondary | ICD-10-CM | POA: Diagnosis not present

## 2017-12-25 DIAGNOSIS — H5203 Hypermetropia, bilateral: Secondary | ICD-10-CM | POA: Diagnosis not present

## 2017-12-25 DIAGNOSIS — H524 Presbyopia: Secondary | ICD-10-CM | POA: Diagnosis not present

## 2018-01-01 DIAGNOSIS — E785 Hyperlipidemia, unspecified: Secondary | ICD-10-CM | POA: Diagnosis not present

## 2018-01-01 DIAGNOSIS — R5382 Chronic fatigue, unspecified: Secondary | ICD-10-CM | POA: Diagnosis not present

## 2018-01-01 DIAGNOSIS — M545 Low back pain: Secondary | ICD-10-CM | POA: Diagnosis not present

## 2018-01-01 DIAGNOSIS — R7989 Other specified abnormal findings of blood chemistry: Secondary | ICD-10-CM | POA: Diagnosis not present

## 2018-01-01 DIAGNOSIS — E538 Deficiency of other specified B group vitamins: Secondary | ICD-10-CM | POA: Diagnosis not present

## 2018-01-09 ENCOUNTER — Other Ambulatory Visit: Payer: Self-pay | Admitting: Internal Medicine

## 2018-01-09 ENCOUNTER — Ambulatory Visit
Admission: RE | Admit: 2018-01-09 | Discharge: 2018-01-09 | Disposition: A | Discharge status: Home / Self Care | Payer: PPO | Source: Ambulatory Visit | Attending: Internal Medicine | Admitting: Internal Medicine

## 2018-01-09 DIAGNOSIS — R1011 Right upper quadrant pain: Secondary | ICD-10-CM | POA: Diagnosis not present

## 2018-01-09 DIAGNOSIS — R112 Nausea with vomiting, unspecified: Secondary | ICD-10-CM | POA: Diagnosis not present

## 2018-01-09 DIAGNOSIS — N390 Urinary tract infection, site not specified: Secondary | ICD-10-CM | POA: Diagnosis not present

## 2018-01-09 DIAGNOSIS — R3129 Other microscopic hematuria: Secondary | ICD-10-CM | POA: Diagnosis not present

## 2018-01-09 DIAGNOSIS — K760 Fatty (change of) liver, not elsewhere classified: Secondary | ICD-10-CM | POA: Diagnosis not present

## 2018-01-09 DIAGNOSIS — Z6831 Body mass index (BMI) 31.0-31.9, adult: Secondary | ICD-10-CM | POA: Diagnosis not present

## 2018-01-09 DIAGNOSIS — R3915 Urgency of urination: Secondary | ICD-10-CM | POA: Diagnosis not present

## 2018-01-11 ENCOUNTER — Other Ambulatory Visit: Payer: Self-pay | Admitting: Internal Medicine

## 2018-01-11 DIAGNOSIS — Z6831 Body mass index (BMI) 31.0-31.9, adult: Secondary | ICD-10-CM | POA: Diagnosis not present

## 2018-01-11 DIAGNOSIS — R3129 Other microscopic hematuria: Secondary | ICD-10-CM | POA: Diagnosis not present

## 2018-01-11 DIAGNOSIS — I2510 Atherosclerotic heart disease of native coronary artery without angina pectoris: Secondary | ICD-10-CM | POA: Diagnosis not present

## 2018-01-11 DIAGNOSIS — R011 Cardiac murmur, unspecified: Secondary | ICD-10-CM | POA: Diagnosis not present

## 2018-01-11 DIAGNOSIS — R509 Fever, unspecified: Secondary | ICD-10-CM | POA: Diagnosis not present

## 2018-01-11 DIAGNOSIS — N10 Acute tubulo-interstitial nephritis: Secondary | ICD-10-CM | POA: Diagnosis not present

## 2018-01-11 DIAGNOSIS — N390 Urinary tract infection, site not specified: Secondary | ICD-10-CM | POA: Diagnosis not present

## 2018-01-11 DIAGNOSIS — R112 Nausea with vomiting, unspecified: Secondary | ICD-10-CM | POA: Diagnosis not present

## 2018-01-14 ENCOUNTER — Other Ambulatory Visit (HOSPITAL_COMMUNITY): Payer: Self-pay | Admitting: Internal Medicine

## 2018-01-14 DIAGNOSIS — R011 Cardiac murmur, unspecified: Secondary | ICD-10-CM

## 2018-01-14 DIAGNOSIS — I2510 Atherosclerotic heart disease of native coronary artery without angina pectoris: Secondary | ICD-10-CM

## 2018-01-16 ENCOUNTER — Ambulatory Visit (HOSPITAL_COMMUNITY): Payer: PPO | Attending: Cardiology

## 2018-01-16 ENCOUNTER — Other Ambulatory Visit: Payer: Self-pay

## 2018-01-16 DIAGNOSIS — R011 Cardiac murmur, unspecified: Secondary | ICD-10-CM | POA: Diagnosis not present

## 2018-01-16 DIAGNOSIS — I2510 Atherosclerotic heart disease of native coronary artery without angina pectoris: Secondary | ICD-10-CM | POA: Diagnosis not present

## 2018-01-16 DIAGNOSIS — I5189 Other ill-defined heart diseases: Secondary | ICD-10-CM | POA: Diagnosis not present

## 2018-06-03 DIAGNOSIS — R946 Abnormal results of thyroid function studies: Secondary | ICD-10-CM | POA: Diagnosis not present

## 2018-06-03 DIAGNOSIS — E7849 Other hyperlipidemia: Secondary | ICD-10-CM | POA: Diagnosis not present

## 2018-06-03 DIAGNOSIS — E559 Vitamin D deficiency, unspecified: Secondary | ICD-10-CM | POA: Diagnosis not present

## 2018-06-03 DIAGNOSIS — R82998 Other abnormal findings in urine: Secondary | ICD-10-CM | POA: Diagnosis not present

## 2018-06-11 DIAGNOSIS — M545 Low back pain: Secondary | ICD-10-CM | POA: Diagnosis not present

## 2018-06-11 DIAGNOSIS — I350 Nonrheumatic aortic (valve) stenosis: Secondary | ICD-10-CM | POA: Diagnosis not present

## 2018-06-11 DIAGNOSIS — Z0000 Encounter for general adult medical examination without abnormal findings: Secondary | ICD-10-CM | POA: Diagnosis not present

## 2018-06-11 DIAGNOSIS — Z1389 Encounter for screening for other disorder: Secondary | ICD-10-CM | POA: Diagnosis not present

## 2018-06-11 DIAGNOSIS — M25571 Pain in right ankle and joints of right foot: Secondary | ICD-10-CM | POA: Diagnosis not present

## 2018-06-11 DIAGNOSIS — I2510 Atherosclerotic heart disease of native coronary artery without angina pectoris: Secondary | ICD-10-CM | POA: Diagnosis not present

## 2018-06-11 DIAGNOSIS — M25572 Pain in left ankle and joints of left foot: Secondary | ICD-10-CM | POA: Diagnosis not present

## 2018-06-11 DIAGNOSIS — G2581 Restless legs syndrome: Secondary | ICD-10-CM | POA: Diagnosis not present

## 2018-06-11 DIAGNOSIS — E7849 Other hyperlipidemia: Secondary | ICD-10-CM | POA: Diagnosis not present

## 2018-06-11 DIAGNOSIS — Z6832 Body mass index (BMI) 32.0-32.9, adult: Secondary | ICD-10-CM | POA: Diagnosis not present

## 2018-06-11 DIAGNOSIS — E559 Vitamin D deficiency, unspecified: Secondary | ICD-10-CM | POA: Diagnosis not present

## 2018-06-14 DIAGNOSIS — Z1212 Encounter for screening for malignant neoplasm of rectum: Secondary | ICD-10-CM | POA: Diagnosis not present

## 2018-07-07 NOTE — Progress Notes (Signed)
Cardiology Office Note   Date:  07/11/2018   ID:  Tiffany Washington, DOB April 21, 1941, MRN [id number]  PCP:  Tiffany Hatchet, MD  Cardiologist:   No primary care provider on file. Referring:  Tiffany Hatchet, MD  Chief Complaint  Patient presents with  . Coronary Calcium      History of Present Illness: Tiffany Washington is a 77 y.o. female who presents for evaluation of coronary calcium.  This was noted to have coronary calcium on a CT recently.  This was in the LM and was three vessel.  The CT was done to evaluate abdominal pain.    She did have an echo in June.  There was mild AS.  This was done because of a murmur.  She is never had any cardiac history.  She denies any chest pressure, neck or arm discomfort.  She somewhat limited by joint pains and back and knee pains.She can rake leaves and work in [employer] garden.  However, walking 250 feet to get the mail should get short of breath.  She denies any resting shortness of breath, PND or orthopnea.  She has not had any presyncope or syncope.  However, she does have palpitations rarely.  These have been going on for couple of years.  They do not happen frequently.  She is had one episode that lasted about 20 minutes.  Past Medical History:  Diagnosis Date  . Aortic stenosis    Mild  . Arthritis   . GERD (gastroesophageal reflux disease)    mild  . 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 Restless leg syndrome     Past Surgical History:  Procedure Laterality Date  . CESAREAN SECTION    . COLONOSCOPY    . KNEE SURGERY Bilateral    Bilateral  . POLYPECTOMY    . UPPER GASTROINTESTINAL ENDOSCOPY    . VAGINAL HYSTERECTOMY       Current Outpatient Medications  Medication Sig Dispense Refill  . Multiple Vitamins-Minerals (MULTIVITAMIN WITH MINERALS) tablet Take 1 tablet by mouth daily. Comes in a daily package.  Takes one package a day     . omeprazole (PRILOSEC) 20 MG capsule Take 20 mg by mouth as needed.     .  pramipexole (MIRAPEX) 0.25 MG tablet Take 0.125-0.25 mg by mouth 2 (two) times daily. Takes 0.25 mg at 1600 and 0.125mg  at bedtime.    . rosuvastatin (CRESTOR) 20 MG tablet Take 1 tablet (20 mg total) by mouth daily. 30 tablet 0   No current facility-administered medications for this visit.     Allergies:   Carbamazepine; Gabapentin; Indocin [indomethacin]; Meloxicam; Pamelor [nortriptyline hcl]; Ropinirole; Sulfa antibiotics; and Voltaren [diclofenac sodium]    Social History:  The patient  reports that she has never smoked. She has never used smokeless tobacco. She reports that she does not drink alcohol or use drugs.   Family History:  The patient's family history includes CAD (age of onset: 84) in her brother; Congestive Heart Failure in her mother; Diabetes in her brother and sister; Gout in her son; Heart failure in her mother; Hypertension in her mother; Pulmonary embolism in her father.    ROS:  Please see the history of present illness.   Otherwise, review of systems are positive for none.   All other systems are reviewed and negative.    PHYSICAL EXAM: VS:  BP 140/82 Comment: right arm  Pulse 65   Ht 5\' 4"  (1.626 m)  Wt 192 lb 3.2 oz (87.2 kg)   BMI 32.99 kg/m  , BMI Body mass index is 32.99 kg/m. GENERAL:  Well appearing HEENT:  Pupils equal round and reactive, fundi not visualized, oral mucosa unremarkable NECK:  No jugular venous distention, waveform within normal limits, carotid upstroke brisk and symmetric, no bruits, no thyromegaly LYMPHATICS:  No cervical, inguinal adenopathy LUNGS:  Clear to auscultation bilaterally BACK:  No CVA tenderness CHEST:  Unremarkable HEART:  PMI not displaced or sustained,S1 and S2 within normal limits, no S3, no S4, no clicks, no rubs, 2 out of 6 apical systolic murmur early peaking radiating slightly at aortic outflow tract murmurs ABD:  Flat, positive bowel sounds normal in frequency in pitch, no bruits, no rebound, no guarding, no  midline pulsatile mass, no hepatomegaly, no splenomegaly EXT:  2 plus pulses throughout, no edema, no cyanosis no clubbing SKIN:  No rashes no nodules NEURO:  Cranial nerves II through XII grossly intact, motor grossly intact throughout PSYCH:  Cognitively intact, oriented to person place and time    EKG:  EKG is ordered today. The ekg ordered today demonstrates sinus rhythm, rate 65, axis within normal limits, intervals within normal limits, no acute ST-T wave changes.   Recent Labs: No results found for requested labs within last 8760 hours.    Lipid Panel No results found for: CHOL, TRIG, HDL, CHOLHDL, VLDL, LDLCALC, LDLDIRECT    Wt Readings from Last 3 Encounters:  07/11/18 192 lb 3.2 oz (87.2 kg)  01/28/14 182 lb (82.6 kg)  08/11/13 194 lb 9 oz (88.3 kg)      Other studies Reviewed: Additional studies/ records that were reviewed today include: CT, echo. Review of the above records demonstrates:  Please see elsewhere in the note.     ASSESSMENT AND PLAN:  CORONARY ARTERY CALCIFICATION:   She has strong family history and other risk factors.  She does have some shortness of breath.  She needs screening with a stress test but would be able walk on a treadmill so I will order a Lexiscan Myoview.  AS:  This was mild.  I will follow this clinically.    DYSLIPIDEMIA: Given the coronary calcium and strong family history I have suggested a statin and she would agree to try Crestor 20 mg daily with a repeat lipid and liver in about 8 weeks.  She is reluctant but again she agrees to try.   Current medicines are reviewed at length with the patient today.  The patient does not have concerns regarding medicines.  The following changes have been made:  As above  Labs/ tests ordered today include:   Orders Placed This Encounter  Procedures  . Lipid panel  . Hepatic function panel  . MYOCARDIAL PERFUSION IMAGING     Disposition:   FU with me in one year.      Signed, Tiffany Breeding, MD  07/11/2018 4:58 PM    [facility]

## 2018-07-11 ENCOUNTER — Encounter: Payer: Self-pay | Admitting: Cardiology

## 2018-07-11 ENCOUNTER — Ambulatory Visit (INDEPENDENT_AMBULATORY_CARE_PROVIDER_SITE_OTHER): Payer: PPO | Admitting: Cardiology

## 2018-07-11 VITALS — BP 140/82 | HR 65 | Ht 64.0 in | Wt 192.2 lb

## 2018-07-11 DIAGNOSIS — Z79899 Other long term (current) drug therapy: Secondary | ICD-10-CM | POA: Diagnosis not present

## 2018-07-11 DIAGNOSIS — E785 Hyperlipidemia, unspecified: Secondary | ICD-10-CM

## 2018-07-11 DIAGNOSIS — I350 Nonrheumatic aortic (valve) stenosis: Secondary | ICD-10-CM | POA: Diagnosis not present

## 2018-07-11 DIAGNOSIS — R931 Abnormal findings on diagnostic imaging of heart and coronary circulation: Secondary | ICD-10-CM | POA: Insufficient documentation

## 2018-07-11 MED ORDER — ROSUVASTATIN CALCIUM 20 MG PO TABS: 20.0000 mg | ORAL_TABLET | Freq: Every day | ORAL | 0 refills | Status: DC

## 2018-07-11 MED ORDER — ROSUVASTATIN CALCIUM 20 MG PO TABS: 20.0000 mg | ORAL_TABLET | Freq: Every day | ORAL | 3 refills | Status: DC

## 2018-07-11 NOTE — Patient Instructions (Signed)
Medication Instructions:  START- Crestor 20 mg daily  If you need a refill on your cardiac medications before your next appointment, please call your pharmacy.  Labwork: Fasting Lipids and Liver in 8 weeks HERE IN OUR OFFICE AT [facility] Take the provided lab slips with you to the lab for your blood draw.   You will need to fast. DO NOT EAT OR DRINK PAST MIDNIGHT.  If you have labs (blood work) drawn today and your tests are completely normal, you will receive your results only by [place] (if you have MyChart) -OR- A paper copy in the mail.  If you have any lab test that is abnormal or we need to change your treatment, we will call you to review these results.  Testing/Procedures: Your physician has requested that you have a lexiscan myoview. For further information please visit [email or web address]. Please follow instruction sheet, as given.   Follow-Up: You will need a follow up appointment in 12 months.  Please call our office 2 months in advance to schedule this appointment.  You may see Dr Percival Spanish or one of the following Advanced Practice Providers on your designated Care Team:   Rosaria Ferries, PA-C . Jory Sims, DNP, ANP   At [facility], you and your health needs are our priority.  As part of our continuing mission to provide you with exceptional heart care, we have created designated Provider Care Teams.  These Care Teams include your primary Cardiologist (physician) and Advanced Practice Providers (APPs -  Physician Assistants and Nurse Practitioners) who all work together to provide you with the care you need, when you need it.

## 2018-07-16 ENCOUNTER — Telehealth (HOSPITAL_COMMUNITY): Payer: Self-pay

## 2018-07-16 NOTE — Telephone Encounter (Signed)
Encounter complete. 

## 2018-07-17 NOTE — Addendum Note (Signed)
Addended by: Therisa Doyne on: 07/17/2018 04:58 PM   Modules accepted: Orders

## 2018-07-18 ENCOUNTER — Ambulatory Visit (HOSPITAL_COMMUNITY)
Admission: RE | Admit: 2018-07-18 | Discharge: 2018-07-18 | Disposition: A | Discharge status: Home / Self Care | Payer: PPO | Source: Ambulatory Visit | Attending: Cardiology | Admitting: Cardiology

## 2018-07-18 DIAGNOSIS — R931 Abnormal findings on diagnostic imaging of heart and coronary circulation: Secondary | ICD-10-CM | POA: Diagnosis not present

## 2018-07-18 DIAGNOSIS — Z85820 Personal history of malignant melanoma of skin: Secondary | ICD-10-CM | POA: Insufficient documentation

## 2018-07-18 DIAGNOSIS — R002 Palpitations: Secondary | ICD-10-CM | POA: Insufficient documentation

## 2018-07-18 DIAGNOSIS — Z8249 Family history of ischemic heart disease and other diseases of the circulatory system: Secondary | ICD-10-CM | POA: Insufficient documentation

## 2018-07-18 DIAGNOSIS — I350 Nonrheumatic aortic (valve) stenosis: Secondary | ICD-10-CM | POA: Diagnosis not present

## 2018-07-18 DIAGNOSIS — K219 Gastro-esophageal reflux disease without esophagitis: Secondary | ICD-10-CM | POA: Insufficient documentation

## 2018-07-18 DIAGNOSIS — R0609 Other forms of dyspnea: Secondary | ICD-10-CM | POA: Insufficient documentation

## 2018-07-18 DIAGNOSIS — G2581 Restless legs syndrome: Secondary | ICD-10-CM | POA: Insufficient documentation

## 2018-07-18 LAB — MYOCARDIAL PERFUSION IMAGING
CHL CUP NUCLEAR SDS: 0
CHL CUP NUCLEAR SSS: 0
CHL CUP RESTING HR STRESS: 58 {beats}/min
CSEPPHR: 81 {beats}/min
LV dias vol: 78 mL (ref 46–106)
LV sys vol: 28 mL
NUC STRESS TID: 1.22
SRS: 0

## 2018-07-18 MED ORDER — TECHNETIUM TC 99M TETROFOSMIN IV KIT
32.9000 | PACK | Freq: Once | INTRAVENOUS | Status: AC | PRN
  Administered 2018-07-18: 32.9 via INTRAVENOUS
  Filled 2018-07-18: qty 33

## 2018-07-18 MED ORDER — REGADENOSON 0.4 MG/5ML IV SOLN
0.4000 mg | Freq: Once | INTRAVENOUS | Status: AC
  Administered 2018-07-18: 0.4 mg via INTRAVENOUS

## 2018-07-18 MED ORDER — TECHNETIUM TC 99M TETROFOSMIN IV KIT
11.0000 | PACK | Freq: Once | INTRAVENOUS | Status: AC | PRN
  Administered 2018-07-18: 11 via INTRAVENOUS
  Filled 2018-07-18: qty 11

## 2018-08-08 ENCOUNTER — Ambulatory Visit: Payer: PPO | Admitting: Cardiology

## 2018-09-02 DIAGNOSIS — M797 Fibromyalgia: Secondary | ICD-10-CM | POA: Diagnosis not present

## 2018-09-02 DIAGNOSIS — M1990 Unspecified osteoarthritis, unspecified site: Secondary | ICD-10-CM | POA: Diagnosis not present

## 2018-09-02 DIAGNOSIS — Z6833 Body mass index (BMI) 33.0-33.9, adult: Secondary | ICD-10-CM | POA: Diagnosis not present

## 2018-10-02 DIAGNOSIS — E7849 Other hyperlipidemia: Secondary | ICD-10-CM | POA: Diagnosis not present

## 2018-10-04 DIAGNOSIS — R0989 Other specified symptoms and signs involving the circulatory and respiratory systems: Secondary | ICD-10-CM | POA: Diagnosis not present

## 2018-10-04 DIAGNOSIS — R300 Dysuria: Secondary | ICD-10-CM | POA: Diagnosis not present

## 2018-10-04 DIAGNOSIS — R05 Cough: Secondary | ICD-10-CM | POA: Diagnosis not present

## 2018-10-04 DIAGNOSIS — J159 Unspecified bacterial pneumonia: Secondary | ICD-10-CM | POA: Diagnosis not present

## 2018-10-04 DIAGNOSIS — R509 Fever, unspecified: Secondary | ICD-10-CM | POA: Diagnosis not present

## 2018-10-04 DIAGNOSIS — J111 Influenza due to unidentified influenza virus with other respiratory manifestations: Secondary | ICD-10-CM | POA: Diagnosis not present

## 2018-10-04 DIAGNOSIS — R0602 Shortness of breath: Secondary | ICD-10-CM | POA: Diagnosis not present

## 2019-01-13 DIAGNOSIS — Z6834 Body mass index (BMI) 34.0-34.9, adult: Secondary | ICD-10-CM | POA: Diagnosis not present

## 2019-01-13 DIAGNOSIS — Z01419 Encounter for gynecological examination (general) (routine) without abnormal findings: Secondary | ICD-10-CM | POA: Diagnosis not present

## 2019-01-13 DIAGNOSIS — Z124 Encounter for screening for malignant neoplasm of cervix: Secondary | ICD-10-CM | POA: Diagnosis not present

## 2019-01-13 DIAGNOSIS — Z1231 Encounter for screening mammogram for malignant neoplasm of breast: Secondary | ICD-10-CM | POA: Diagnosis not present

## 2019-04-21 DIAGNOSIS — R1032 Left lower quadrant pain: Secondary | ICD-10-CM | POA: Diagnosis not present

## 2019-04-21 DIAGNOSIS — N3001 Acute cystitis with hematuria: Secondary | ICD-10-CM | POA: Diagnosis not present

## 2019-05-17 IMAGING — CT CT ABD-PELV W/O CM
2 of 4 series · 11 of 46 positions shown, 12 images · non-contrast
Comparison: No priors.

CLINICAL DATA: 76-year-old female with history of left-sided flank
pain and right upper quadrant abdominal pain for the past couple of
days.

EXAM:
CT ABDOMEN AND PELVIS WITHOUT CONTRAST
TECHNIQUE: Multidetector CT imaging of the abdomen and pelvis was performed
following the standard protocol without IV contrast.

[Series 2: renal stone 5.00 br40 s3 ax · axial · 0.53mm/px · z∈[+1271,+1656]mm · 8 of 93 slices shown, 9 images]
[im 8/93  soft-tissue]
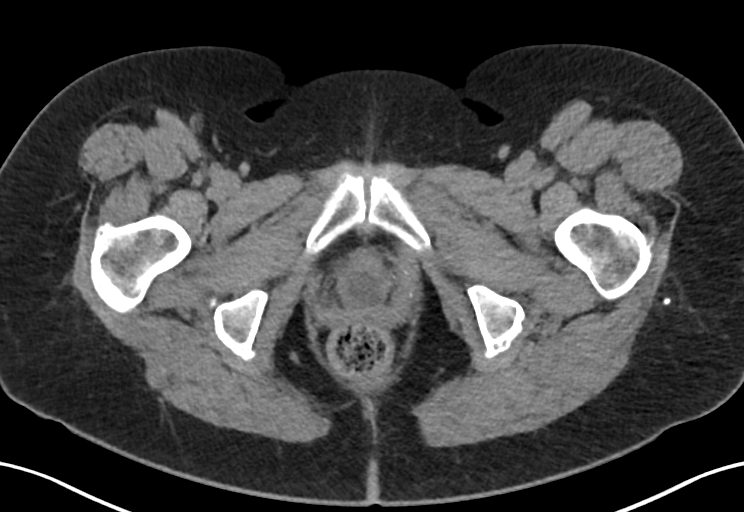
[im 8/93  bone]
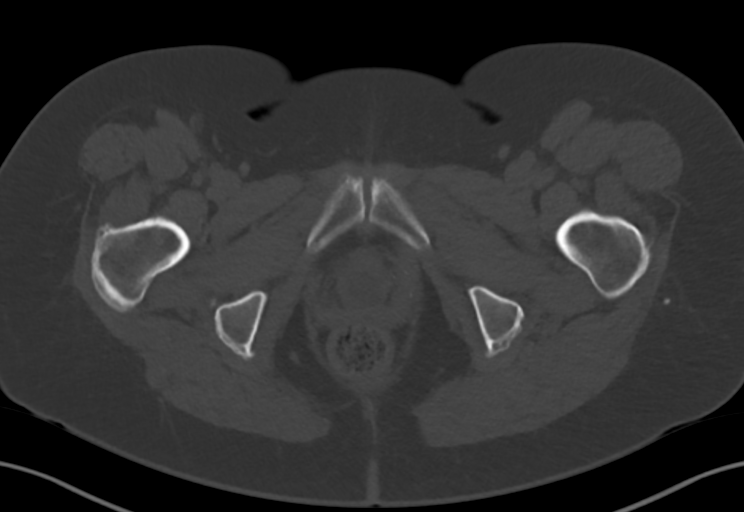
[im 19/93  soft-tissue]
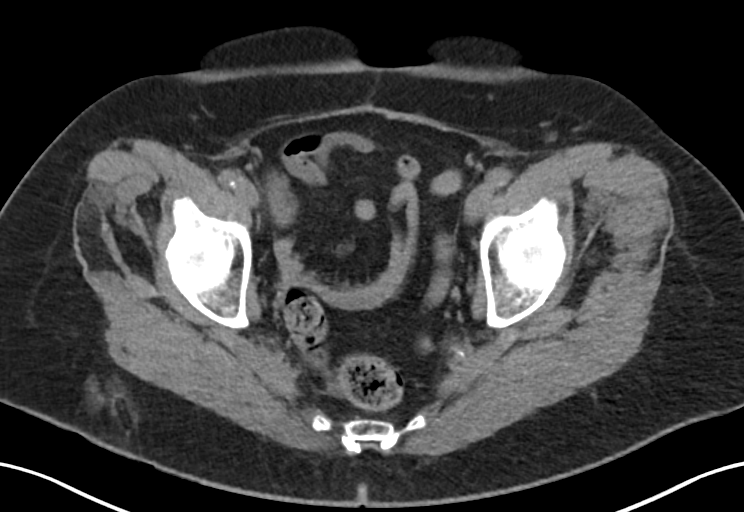
[im 30/93  soft-tissue]
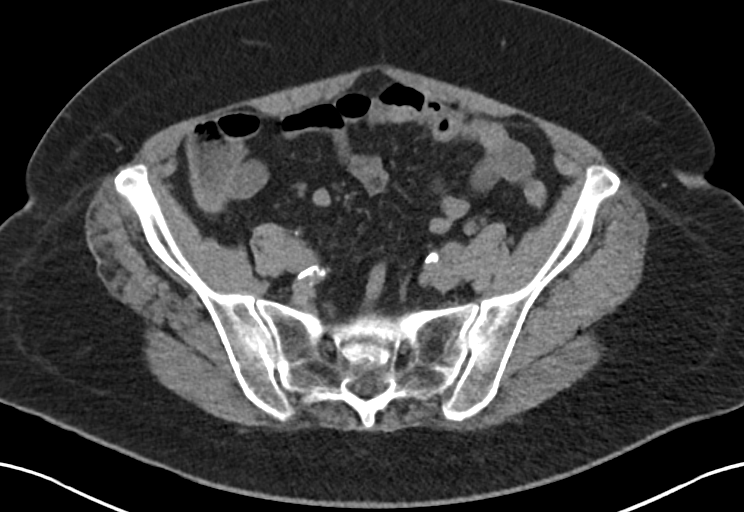
[im 41/93  soft-tissue]
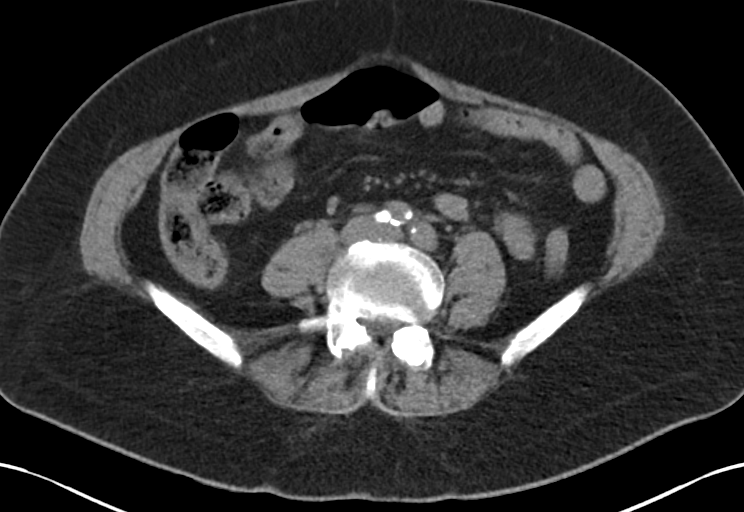
[im 52/93  soft-tissue]
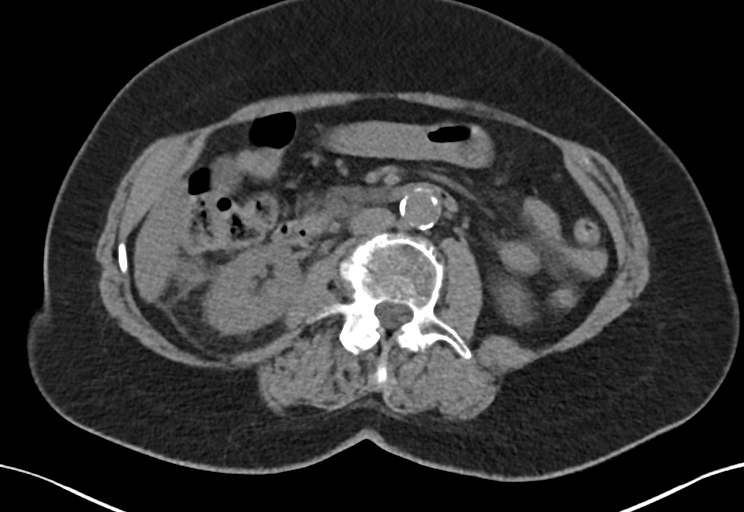
[im 63/93  soft-tissue]
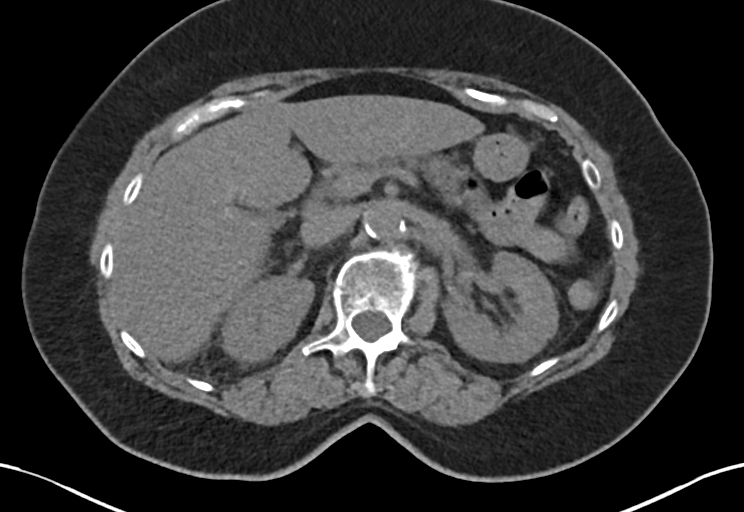
[im 74/93  soft-tissue]
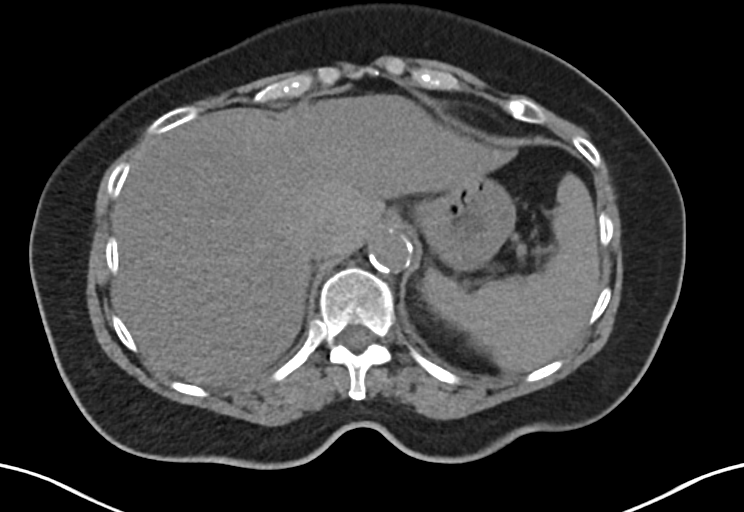
[im 85/93  soft-tissue]
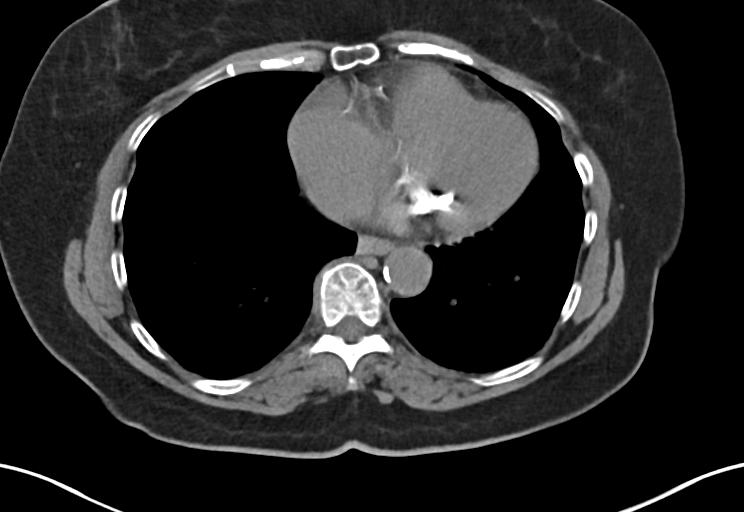

[Series 6: renal stone 2.00 br40 s3 cor · coronal · 0.77mm/px · 3 of 135 slices shown]
[im 45/135  soft-tissue]
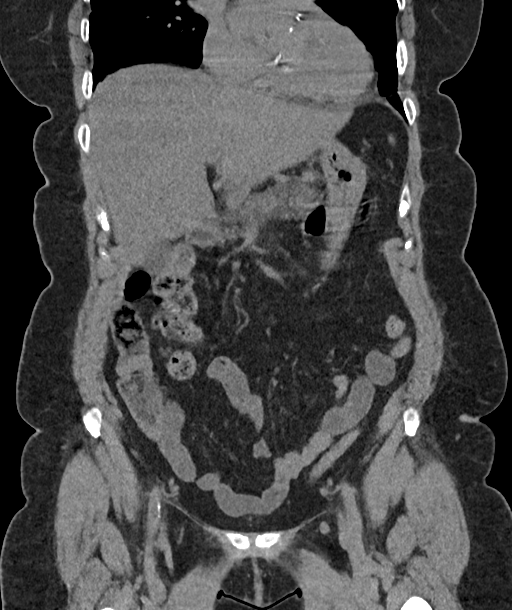
[im 60/135  soft-tissue]
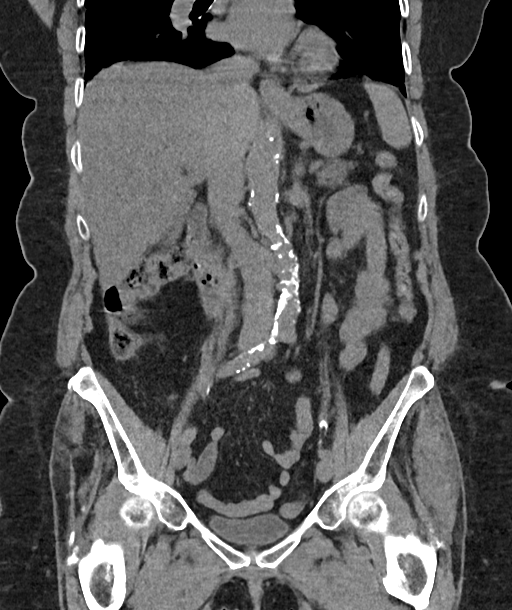
[im 75/135  soft-tissue]
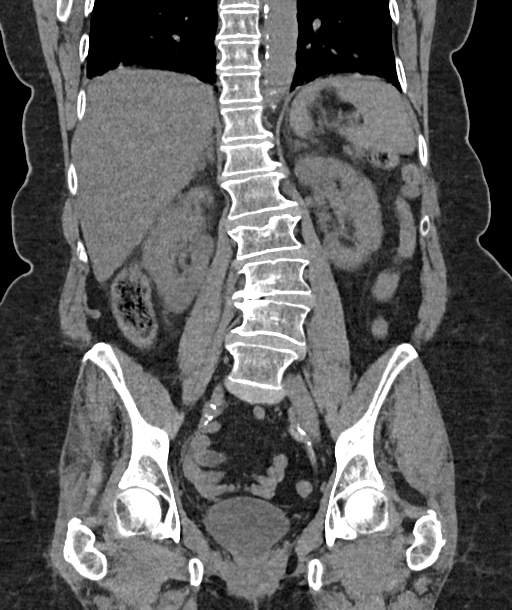

[11 of 46 positions shown; findings below may reference images not displayed]

FINDINGS: Lower chest: Moderate calcifications of the aortic valve. Severe
calcifications of the mitral valve and annulus. Atherosclerotic
calcifications in the left main, left anterior descending, left
circumflex and right coronary arteries, as well as the thoracic
aorta.

Hepatobiliary: Mild diffuse low attenuation throughout the hepatic
parenchyma, indicative of hepatic steatosis. No definite cystic or
solid hepatic lesions are confidently identified on today's
noncontrast CT examination. Unenhanced appearance of the gallbladder
is normal.

Pancreas: No definite pancreatic mass or peripancreatic fluid or
inflammatory changes are noted on today's noncontrast CT
examination.

Spleen: Unremarkable.

Adrenals/Urinary Tract: Mild bilateral perinephric stranding
(nonspecific). There are no abnormal calcifications within the
collecting system of either kidney, along the course of either
ureter, or within the lumen of the urinary bladder. No
hydroureteronephrosis or perinephric stranding to suggest urinary
tract obstruction at this time. The unenhanced appearance of the
kidneys is unremarkable bilaterally. Cystocele. Unenhanced
appearance of the urinary bladder is otherwise normal. 12 mm
low-attenuation (8 HU) left adrenal nodule, compatible with an
adenoma. Right adrenal gland is normal in appearance.

Stomach/Bowel: Unenhanced appearance of the stomach is normal. No
pathologic dilatation of small bowel or colon. Normal appendix.

Vascular/Lymphatic: Aortic atherosclerosis. No lymphadenopathy noted
in the abdomen or pelvis on today's noncontrast CT examination.

Reproductive: Status post hysterectomy. Ovaries are not confidently
identified may be surgically absent or atrophic.

Other: No significant volume of ascites.  No pneumoperitoneum.

Musculoskeletal: Old compression fracture of superior endplate of
T12 with 20% loss of anterior vertebral body height. There are no
aggressive appearing lytic or blastic lesions noted in the
visualized portions of the skeleton.
IMPRESSION: 1. No acute findings are noted in the abdomen or pelvis to account
for the patient's symptoms. Specifically, no urinary tract calculi
no findings of urinary tract obstruction are noted at this time.
2. Hepatic steatosis.
3. Aortic atherosclerosis, in addition to left main and 3 vessel
coronary artery disease. Assessment for potential risk factor
modification, dietary therapy or pharmacologic therapy may be
warranted, if clinically indicated.
4. There are calcifications of the aortic valve and mitral
valve/annulus. Echocardiographic correlation for evaluation of
potential valvular dysfunction may be warranted if clinically
indicated.
5. Additional incidental findings, as above.

## 2019-06-09 DIAGNOSIS — E559 Vitamin D deficiency, unspecified: Secondary | ICD-10-CM | POA: Diagnosis not present

## 2019-06-09 DIAGNOSIS — E7849 Other hyperlipidemia: Secondary | ICD-10-CM | POA: Diagnosis not present

## 2019-06-16 DIAGNOSIS — I2510 Atherosclerotic heart disease of native coronary artery without angina pectoris: Secondary | ICD-10-CM | POA: Diagnosis not present

## 2019-06-16 DIAGNOSIS — I350 Nonrheumatic aortic (valve) stenosis: Secondary | ICD-10-CM | POA: Diagnosis not present

## 2019-06-16 DIAGNOSIS — R7401 Elevation of levels of liver transaminase levels: Secondary | ICD-10-CM | POA: Diagnosis not present

## 2019-06-16 DIAGNOSIS — M1990 Unspecified osteoarthritis, unspecified site: Secondary | ICD-10-CM | POA: Diagnosis not present

## 2019-06-16 DIAGNOSIS — Z0000 Encounter for general adult medical examination without abnormal findings: Secondary | ICD-10-CM | POA: Diagnosis not present

## 2019-06-16 DIAGNOSIS — M797 Fibromyalgia: Secondary | ICD-10-CM | POA: Diagnosis not present

## 2019-06-16 DIAGNOSIS — E785 Hyperlipidemia, unspecified: Secondary | ICD-10-CM | POA: Diagnosis not present

## 2019-06-16 DIAGNOSIS — N329 Bladder disorder, unspecified: Secondary | ICD-10-CM | POA: Diagnosis not present

## 2019-06-16 DIAGNOSIS — E559 Vitamin D deficiency, unspecified: Secondary | ICD-10-CM | POA: Diagnosis not present

## 2019-06-16 DIAGNOSIS — Z1331 Encounter for screening for depression: Secondary | ICD-10-CM | POA: Diagnosis not present

## 2019-06-16 DIAGNOSIS — G2581 Restless legs syndrome: Secondary | ICD-10-CM | POA: Diagnosis not present

## 2019-06-17 ENCOUNTER — Telehealth: Payer: Self-pay | Admitting: *Deleted

## 2019-06-17 NOTE — Telephone Encounter (Signed)
A message was left, re: her new patient appointment. 

## 2019-06-19 DIAGNOSIS — Z23 Encounter for immunization: Secondary | ICD-10-CM | POA: Diagnosis not present

## 2019-06-19 DIAGNOSIS — R82998 Other abnormal findings in urine: Secondary | ICD-10-CM | POA: Diagnosis not present

## 2019-06-25 DIAGNOSIS — G5601 Carpal tunnel syndrome, right upper limb: Secondary | ICD-10-CM | POA: Diagnosis not present

## 2019-06-25 DIAGNOSIS — M25531 Pain in right wrist: Secondary | ICD-10-CM | POA: Diagnosis not present

## 2019-06-25 DIAGNOSIS — M13841 Other specified arthritis, right hand: Secondary | ICD-10-CM | POA: Diagnosis not present

## 2019-07-17 DIAGNOSIS — Z7189 Other specified counseling: Secondary | ICD-10-CM | POA: Insufficient documentation

## 2019-07-17 NOTE — Progress Notes (Signed)
Cardiology Office Note   Date:  07/18/2019   ID:  Tiffany Washington, DOB Aug 22, 1940, MRN [id number]  PCP:  Tiffany Hatchet, MD  Cardiologist:   No primary care provider on file. Referring:  Tiffany Hatchet, MD  Chief Complaint  Patient presents with  . Aortic Stenosis      History of Present Illness: Tiffany Washington is a 78 y.o. female who presents for evaluation of coronary calcium.  This was noted to have coronary calcium on a CT recently.  This was in the LM and was three vessel.  The CT was done to evaluate abdominal pain.    She did have an echo in June.  There was mild AS.  This was done because of a murmur.   She had a negative Lexiscan Myoview.   Since I last saw her she has done well.  She does not exercise routinely but she does yard work.  She has some baseline dyspnea which is unchanged from when we did her stress test last year.  She denies any presyncope or syncope.  She is not been having any chest discomfort, neck or arm discomfort.  She does not have any palpitations.  She has had no weight gain or edema.   Past Medical History:  Diagnosis Date  . Aortic stenosis    Mild  . Arthritis   . GERD (gastroesophageal reflux disease)    mild  . 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 Restless leg syndrome     Past Surgical History:  Procedure Laterality Date  . CESAREAN SECTION    . COLONOSCOPY    . KNEE SURGERY Bilateral    Bilateral  . POLYPECTOMY    . UPPER GASTROINTESTINAL ENDOSCOPY    . VAGINAL HYSTERECTOMY       Current Outpatient Medications  Medication Sig Dispense Refill  . Multiple Vitamins-Minerals (MULTIVITAMIN WITH MINERALS) tablet Take 1 tablet by mouth daily. Comes in a daily package.  Takes one package a day     . omeprazole (PRILOSEC) 20 MG capsule Take 20 mg by mouth as needed.     . pramipexole (MIRAPEX) 0.25 MG tablet Take 0.125-0.25 mg by mouth 2 (two) times daily. Takes 0.25 mg at 1600 and 0.125mg  at bedtime.    .  rosuvastatin (CRESTOR) 20 MG tablet Take by mouth daily. Pt takes 10 mg daily     No current facility-administered medications for this visit.    Allergies:   Carbamazepine, Gabapentin, Indocin [indomethacin], Meloxicam, Pamelor [nortriptyline hcl], Ropinirole, Sulfa antibiotics, and Voltaren [diclofenac sodium]    ROS:  Please see the history of present illness.   Otherwise, review of systems are positive for none.   All other systems are reviewed and negative.    PHYSICAL EXAM: VS:  BP (!) 154/79   Pulse 65   Ht 5\' 5"  (1.651 m)   Wt 191 lb 8 oz (86.9 kg)   SpO2 100%   BMI 31.87 kg/m  , BMI Body mass index is 31.87 kg/m. GENERAL:  Well appearing NECK:  No jugular venous distention, waveform within normal limits, carotid upstroke brisk and symmetric, no bruits, no thyromegaly LUNGS:  Clear to auscultation bilaterally CHEST:  Unremarkable HEART:  PMI not displaced or sustained,S1 and S2 within normal limits, no S3, no S4, no clicks, no rubs, 3 out of 6 mid peaking systolic murmur radiating slightly at aortic outflow tract but no change with Valsalva, no diastolic murmurs ABD:  Flat, positive  bowel sounds normal in frequency in pitch, no bruits, no rebound, no guarding, no midline pulsatile mass, no hepatomegaly, no splenomegaly EXT:  2 plus pulses throughout, no edema, no cyanosis no clubbing   EKG:  EKG is  ordered today. The ekg ordered today demonstrates sinus rhythm, rate 65, axis within normal limits, intervals within normal limits, no acute ST-T wave changes.   Recent Labs: No results found for requested labs within last 8760 hours.    Lipid Panel No results found for: CHOL, TRIG, HDL, CHOLHDL, VLDL, LDLCALC, LDLDIRECT    Wt Readings from Last 3 Encounters:  07/18/19 191 lb 8 oz (86.9 kg)  07/18/18 192 lb (87.1 kg)  07/11/18 192 lb 3.2 oz (87.2 kg)      Other studies Reviewed: Additional studies/ records that were reviewed today include: Labs, primary care office  records Review of the above records demonstrates: See elsewhere    ASSESSMENT AND PLAN:  CORONARY ARTERY CALCIFICATION:    She had a negative Lexiscan Myoview in 2019.   No change in therapy.  She will continue with risk reduction.    AS: I suspect by exam that this has progressed.  She still does not have any overt symptoms.  I will check an echocardiogram.   DYSLIPIDEMIA: Her LDL was recently 71 with an HDL of 43.  Total cholesterol 146, triglycerides 159.  She had been off of the statin for a while but was convinced to take it again because of coronary calcium.  I agree with this.  I reviewed this conversation and her labs from her primary provider office.  No change in therapy.   HTN: She says she always has a high blood pressure in the office.  She promises to keep a blood pressure diary and mail me the results.  She might need medication adjustment.  COVID EDUCATION: We talked about vaccines and I answered her questions.  She likely will consider taking this.   Current medicines are reviewed at length with the patient today.  The patient does not have concerns regarding medicines.  The following changes have been made:  None  Labs/ tests ordered today include:   Orders Placed This Encounter  Procedures  . EKG 12-Lead  . ECHOCARDIOGRAM COMPLETE     Disposition:   FU with me in one year.     Signed, Minus Breeding, MD  07/18/2019 9:19 AM    [facility]

## 2019-07-18 ENCOUNTER — Encounter: Payer: Self-pay | Admitting: Cardiology

## 2019-07-18 ENCOUNTER — Ambulatory Visit: Payer: PPO | Admitting: Cardiology

## 2019-07-18 ENCOUNTER — Other Ambulatory Visit: Payer: Self-pay

## 2019-07-18 VITALS — BP 154/79 | HR 65 | Ht 65.0 in | Wt 191.5 lb

## 2019-07-18 DIAGNOSIS — I2510 Atherosclerotic heart disease of native coronary artery without angina pectoris: Secondary | ICD-10-CM | POA: Diagnosis not present

## 2019-07-18 DIAGNOSIS — E785 Hyperlipidemia, unspecified: Secondary | ICD-10-CM | POA: Diagnosis not present

## 2019-07-18 DIAGNOSIS — R931 Abnormal findings on diagnostic imaging of heart and coronary circulation: Secondary | ICD-10-CM | POA: Diagnosis not present

## 2019-07-18 DIAGNOSIS — I350 Nonrheumatic aortic (valve) stenosis: Secondary | ICD-10-CM

## 2019-07-18 DIAGNOSIS — Z7189 Other specified counseling: Secondary | ICD-10-CM

## 2019-07-18 NOTE — Patient Instructions (Signed)
Medication Instructions:  Your physician recommends that you continue on your current medications as directed. Please refer to the Current Medication list given to you today.  *If you need a refill on your cardiac medications before your next appointment, please call your pharmacy*  Lab Work: none If you have labs (blood work) drawn today and your tests are completely normal, you will receive your results only by: Marland Kitchen MyChart Message (if you have MyChart) OR . A paper copy in the mail If you have any lab test that is abnormal or we need to change your treatment, we will call you to review the results.  Testing/Procedures: Your physician has requested that you have an echocardiogram. Echocardiography is a painless test that uses sound waves to create images of your heart. It provides your doctor with information about the size and shape of your heart and how well your heart's chambers and valves are working. This procedure takes approximately one hour. There are no restrictions for this procedure. LOCATION: [facility] [facility]: [address], [place], [place] [address]     Follow-Up: At [facility], you and your health needs are our priority.  As part of our continuing mission to provide you with exceptional heart care, we have created designated Provider Care Teams.  These Care Teams include your primary Cardiologist (physician) and Advanced Practice Providers (APPs -  Physician Assistants and Nurse Practitioners) who all work together to provide you with the care you need, when you need it.  Your next appointment:   12 month(s)  The format for your next appointment:   Either In Person or Virtual  Provider:   Minus Breeding, MD  Other Instructions [place]

## 2019-07-30 ENCOUNTER — Other Ambulatory Visit (HOSPITAL_COMMUNITY): Payer: PPO

## 2019-07-30 ENCOUNTER — Telehealth (HOSPITAL_COMMUNITY): Payer: Self-pay | Admitting: Radiology

## 2019-07-30 NOTE — Telephone Encounter (Signed)
07/30/19 patient cancelled echo waiting for COVID to get better. Please replace order when patient feels comfortable coming to office for echocardiogram.

## 2019-08-05 DIAGNOSIS — G5601 Carpal tunnel syndrome, right upper limb: Secondary | ICD-10-CM | POA: Diagnosis not present

## 2019-08-15 DIAGNOSIS — M9905 Segmental and somatic dysfunction of pelvic region: Secondary | ICD-10-CM | POA: Diagnosis not present

## 2019-08-15 DIAGNOSIS — M9903 Segmental and somatic dysfunction of lumbar region: Secondary | ICD-10-CM | POA: Diagnosis not present

## 2019-08-15 DIAGNOSIS — M9902 Segmental and somatic dysfunction of thoracic region: Secondary | ICD-10-CM | POA: Diagnosis not present

## 2019-08-15 DIAGNOSIS — M545 Low back pain: Secondary | ICD-10-CM | POA: Diagnosis not present

## 2019-08-18 DIAGNOSIS — M9902 Segmental and somatic dysfunction of thoracic region: Secondary | ICD-10-CM | POA: Diagnosis not present

## 2019-08-18 DIAGNOSIS — M9905 Segmental and somatic dysfunction of pelvic region: Secondary | ICD-10-CM | POA: Diagnosis not present

## 2019-08-18 DIAGNOSIS — M9903 Segmental and somatic dysfunction of lumbar region: Secondary | ICD-10-CM | POA: Diagnosis not present

## 2019-08-18 DIAGNOSIS — M545 Low back pain: Secondary | ICD-10-CM | POA: Diagnosis not present

## 2019-08-19 DIAGNOSIS — M25531 Pain in right wrist: Secondary | ICD-10-CM | POA: Diagnosis not present

## 2019-08-20 DIAGNOSIS — M9905 Segmental and somatic dysfunction of pelvic region: Secondary | ICD-10-CM | POA: Diagnosis not present

## 2019-08-20 DIAGNOSIS — M9902 Segmental and somatic dysfunction of thoracic region: Secondary | ICD-10-CM | POA: Diagnosis not present

## 2019-08-20 DIAGNOSIS — M545 Low back pain: Secondary | ICD-10-CM | POA: Diagnosis not present

## 2019-08-20 DIAGNOSIS — M9903 Segmental and somatic dysfunction of lumbar region: Secondary | ICD-10-CM | POA: Diagnosis not present

## 2019-08-29 DIAGNOSIS — Z1212 Encounter for screening for malignant neoplasm of rectum: Secondary | ICD-10-CM | POA: Diagnosis not present

## 2019-09-22 ENCOUNTER — Telehealth: Payer: Self-pay

## 2019-09-22 NOTE — Telephone Encounter (Signed)
Spoke with patient. Dr. Percival Spanish had recommended starting Chlorthalidone however; patient reports she lost 20lbs and her blood pressure is fine. She would like to hold off on recommendation for medication at this time.

## 2019-09-24 DIAGNOSIS — M545 Low back pain: Secondary | ICD-10-CM | POA: Diagnosis not present

## 2019-09-24 DIAGNOSIS — M4180 Other forms of scoliosis, site unspecified: Secondary | ICD-10-CM | POA: Diagnosis not present

## 2019-09-24 DIAGNOSIS — M48062 Spinal stenosis, lumbar region with neurogenic claudication: Secondary | ICD-10-CM | POA: Diagnosis not present

## 2019-09-26 ENCOUNTER — Telehealth: Payer: Self-pay | Admitting: Cardiology

## 2019-09-26 DIAGNOSIS — I350 Nonrheumatic aortic (valve) stenosis: Secondary | ICD-10-CM

## 2019-09-26 NOTE — Telephone Encounter (Signed)
New message:     Patient calling to reschedule her ECHO it will not let me schedule her, it needs to be a new order please send a staff message for schedulers to call patient.

## 2019-09-26 NOTE — Telephone Encounter (Signed)
New order placed. Message sent to scheduler

## 2019-10-13 ENCOUNTER — Ambulatory Visit (HOSPITAL_COMMUNITY): Payer: PPO | Attending: Internal Medicine

## 2019-10-13 ENCOUNTER — Other Ambulatory Visit: Payer: Self-pay

## 2019-10-13 DIAGNOSIS — I350 Nonrheumatic aortic (valve) stenosis: Secondary | ICD-10-CM | POA: Insufficient documentation

## 2019-10-16 DIAGNOSIS — M48062 Spinal stenosis, lumbar region with neurogenic claudication: Secondary | ICD-10-CM | POA: Diagnosis not present

## 2019-10-21 DIAGNOSIS — M4316 Spondylolisthesis, lumbar region: Secondary | ICD-10-CM | POA: Diagnosis not present

## 2019-10-21 DIAGNOSIS — M48062 Spinal stenosis, lumbar region with neurogenic claudication: Secondary | ICD-10-CM | POA: Diagnosis not present

## 2019-10-21 DIAGNOSIS — M4180 Other forms of scoliosis, site unspecified: Secondary | ICD-10-CM | POA: Diagnosis not present

## 2019-10-21 DIAGNOSIS — M5416 Radiculopathy, lumbar region: Secondary | ICD-10-CM | POA: Diagnosis not present

## 2019-10-27 ENCOUNTER — Other Ambulatory Visit: Payer: Self-pay | Admitting: Neurosurgery

## 2019-10-29 ENCOUNTER — Other Ambulatory Visit: Payer: Self-pay | Admitting: Neurosurgery

## 2019-10-29 NOTE — Pre-Procedure Instructions (Signed)
   Tiffany Washington  10/29/2019      Your procedure is scheduled on Monday, November 03, 2019  Report to [facility] Admitting at 10:00 A.M.  Call this number if you have problems the morning of surgery:  [phone]   Remember:  Do not eat or drink after midnight the night before surgery.   Take these medicines the morning of surgery with A SIP OF WATER : pramipexole (MIRAPEX)  If needed: acetaminophen (TYLENOL) OR  HYDROcodone-acetaminophen(NORCO/VICODIN) for pain  Stop taking Aspirin (unless otherwise advised by surgeon), vitamins, fish oil and herbal medications. Do not take any NSAIDs ie: Ibuprofen, Advil, Naproxen (Aleve), Motrin, BC and Goody Powder; stop now.     Do not wear jewelry, make-up or nail polish.  Do not wear lotions, powders, or perfumes, or deodorant.  Do not shave 48 hours prior to surgery.    Do not bring valuables to the hospital.  [facility] is not responsible for any belongings or valuables.  Contacts, dentures or bridgework may not be worn into surgery.  Leave your suitcase in the car.  After surgery it may be brought to your room.  For patients admitted to the hospital, discharge time will be determined by your treatment team.  Patients discharged the day of surgery will not be allowed to drive home.    Special instructions: See " [facility] Preparing For Surgery " sheet.   Please read over the following fact sheets that you were given. Pain Booklet, Coughing and Deep Breathing and Surgical Site Infection Prevention

## 2019-10-30 ENCOUNTER — Other Ambulatory Visit (HOSPITAL_COMMUNITY)
Admission: RE | Admit: 2019-10-30 | Discharge: 2019-10-30 | Disposition: A | Discharge status: Home / Self Care | Payer: PPO | Source: Ambulatory Visit | Attending: Neurosurgery | Admitting: Neurosurgery

## 2019-10-30 ENCOUNTER — Other Ambulatory Visit: Payer: Self-pay

## 2019-10-30 ENCOUNTER — Encounter (HOSPITAL_COMMUNITY)
Admission: RE | Admit: 2019-10-30 | Discharge: 2019-10-30 | Disposition: A | Discharge status: Home / Self Care | Payer: PPO | Source: Ambulatory Visit | Attending: Neurosurgery | Admitting: Neurosurgery

## 2019-10-30 ENCOUNTER — Encounter (HOSPITAL_COMMUNITY): Payer: Self-pay

## 2019-10-30 DIAGNOSIS — Z01812 Encounter for preprocedural laboratory examination: Secondary | ICD-10-CM | POA: Insufficient documentation

## 2019-10-30 DIAGNOSIS — Z20822 Contact with and (suspected) exposure to covid-19: Secondary | ICD-10-CM | POA: Diagnosis not present

## 2019-10-30 DIAGNOSIS — I350 Nonrheumatic aortic (valve) stenosis: Secondary | ICD-10-CM | POA: Insufficient documentation

## 2019-10-30 DIAGNOSIS — M4316 Spondylolisthesis, lumbar region: Secondary | ICD-10-CM | POA: Diagnosis not present

## 2019-10-30 DIAGNOSIS — I2510 Atherosclerotic heart disease of native coronary artery without angina pectoris: Secondary | ICD-10-CM | POA: Insufficient documentation

## 2019-10-30 HISTORY — PX: BACK SURGERY: SHX140

## 2019-10-30 HISTORY — DX: Presence of dental prosthetic device (complete) (partial): Z97.2

## 2019-10-30 HISTORY — DX: Presence of spectacles and contact lenses: Z97.3

## 2019-10-30 HISTORY — DX: Unspecified hearing loss, unspecified ear: H91.90

## 2019-10-30 HISTORY — DX: Spondylolisthesis, lumbar region: M43.16

## 2019-10-30 HISTORY — DX: Cardiac murmur, unspecified: R01.1

## 2019-10-30 LAB — TYPE AND SCREEN
ABO/RH(D): A NEG
Antibody Screen: NEGATIVE

## 2019-10-30 LAB — BASIC METABOLIC PANEL
Anion gap: 10 (ref 5–15)
BUN: 14 mg/dL (ref 8–23)
CO2: 27 mmol/L (ref 22–32)
Calcium: 9.7 mg/dL (ref 8.9–10.3)
Chloride: 104 mmol/L (ref 98–111)
Creatinine, Ser: 0.72 mg/dL (ref 0.44–1.00)
GFR calc Af Amer: >60 mL/min (ref >60)
GFR calc non Af Amer: >60 mL/min (ref >60)
Glucose, Bld: 94 mg/dL (ref 70–99)
Potassium: 3.8 mmol/L (ref 3.5–5.1)
Sodium: 141 mmol/L (ref 135–145)

## 2019-10-30 LAB — CBC
HCT: 42.6 % (ref 36.0–46.0)
Hemoglobin: 14 g/dL (ref 12.0–15.0)
MCH: 32.1 pg (ref 26.0–34.0)
MCHC: 32.9 g/dL (ref 30.0–36.0)
MCV: 97.7 fL (ref 80.0–100.0)
Platelets: 296 10*3/uL (ref 150–400)
RBC: 4.36 MIL/uL (ref 3.87–5.11)
RDW: 13.2 % (ref 11.5–15.5)
WBC: 6.1 10*3/uL (ref 4.0–10.5)
nRBC: 0 % (ref 0.0–0.2)

## 2019-10-30 LAB — SARS CORONAVIRUS 2 (TAT 6-24 HRS): SARS Coronavirus 2: NEGATIVE

## 2019-10-30 LAB — ABO/RH: ABO/RH(D): A NEG

## 2019-10-30 LAB — SURGICAL PCR SCREEN
MRSA, PCR: NEGATIVE
Staphylococcus aureus: NEGATIVE

## 2019-10-30 NOTE — Progress Notes (Signed)
Pt denies SOB and chest pain. Pt stated that she is under the care of Dr. Percival Spanish, Cardiology and Dr. Velna Hatchet, PCP. Pt denies having a cardiac cath. Pt denies having a chest x ray in the last year. Pt denies recent labs. Pt reminded to quarantine. Pt verbalized understanding of all pre-op instructions. Pt chart forwarded to PA,  Anesthesiology, for review of cardiac history.

## 2019-10-31 NOTE — Anesthesia Preprocedure Evaluation (Addendum)
Anesthesia Evaluation  Patient identified by MRN, date of birth, ID band Patient awake    Reviewed: Allergy & Precautions, NPO status , Patient's Chart, lab work & pertinent test results  History of Anesthesia Complications (+) AWARENESS UNDER ANESTHESIANegative for: history of anesthetic complications  Airway Mallampati: II  TM Distance: >3 FB Neck ROM: Full    Dental  (+) Edentulous Lower, Dental Advisory Given   Pulmonary neg pulmonary ROS,    Pulmonary exam normal        Cardiovascular negative cardio ROS Normal cardiovascular exam+ Valvular Problems/Murmurs AS   IMPRESSIONS     1. Left ventricular ejection fraction, by estimation, is 60 to 65%. The left ventricle has normal function. The left ventricle has no regional wall motion abnormalities. There is mild left ventricular hypertrophy. Left ventricular diastolic parameters  are consistent with Grade I diastolic dysfunction (impaired relaxation). Elevated left atrial pressure. 2. Right ventricular systolic function is normal. The right ventricular size is normal. There is mildly elevated pulmonary artery systolic pressure. 3. Trivial mitral valve regurgitation. 4. AV is trileaflet It is thickened, calcified with very mildly resricted motion. Peak and mean gradients through the valve are 27 ang 17 mm Hg consistent with mild AS.Marland Kitchen The aortic valve is tricuspid. Aortic valve regurgitation is trivial. Mild aortic    Neuro/Psych    GI/Hepatic Neg liver ROS, GERD  ,  Endo/Other  negative endocrine ROS  Renal/GU negative Renal ROS     Musculoskeletal negative musculoskeletal ROS (+)   Abdominal   Peds  Hematology negative hematology ROS (+)   Anesthesia Other Findings Day of surgery medications reviewed with the patient.  Reproductive/Obstetrics                           Anesthesia Physical Anesthesia Plan  ASA: III  Anesthesia Plan:  General   Post-op Pain Management:    Induction: Intravenous  PONV Risk Score and Plan: 4 or greater and Ondansetron, Dexamethasone, Diphenhydramine and Treatment may vary due to age or medical condition  Airway Management Planned: Oral ETT  Additional Equipment:   Intra-op Plan:   Post-operative Plan: Extubation in OR  Informed Consent: I have reviewed the patients History and Physical, chart, labs and discussed the procedure including the risks, benefits and alternatives for the proposed anesthesia with the patient or authorized representative who has indicated his/her understanding and acceptance.     Dental advisory given  Plan Discussed with: Anesthesiologist and CRNA  Anesthesia Plan Comments: (Follows with cardiology for hx of coronary calcification and mild AS. She had a negative Lexiscan Myoview in 2019. Last seen by Dr. Percival Spanish 07/18/19, ordered followup echo done 10/13/19 that showed EF 60-65%, grade 1dd, stable Mild AS with mean gradient 17 mmHg.   Preop labs reviewed, unremarkable.   EKG 07/18/19: NSR. Rate 65.  TTE 10/13/19: 1. Left ventricular ejection fraction, by estimation, is 60 to 65%. The  left ventricle has normal function. The left ventricle has no regional  wall motion abnormalities. There is mild left ventricular hypertrophy.  Left ventricular diastolic parameters  are consistent with Grade I diastolic dysfunction (impaired relaxation).  Elevated left atrial pressure.  2. Right ventricular systolic function is normal. The right ventricular  size is normal. There is mildly elevated pulmonary artery systolic  pressure.  3. Trivial mitral valve regurgitation.  4. AV is trileaflet It is thickened, calcified with very mildly resricted  motion. Peak and mean gradients through the valve are  27 ang 17 mm Hg  consistent with mild AS.Marland Kitchen The aortic valve is tricuspid. Aortic valve  regurgitation is trivial. Mild aortic  valve stenosis.   Nuclear stress  07/18/18: The left ventricular ejection fraction is normal (55-65%). Nuclear stress EF: 64%. There was no ST segment deviation noted during stress. The study is normal.   1. EF 64%, normal wall motion.  2. No evidence for ischemia or infarction by perfusion defects.   Normal study.  )      Anesthesia Quick Evaluation

## 2019-10-31 NOTE — Progress Notes (Signed)
Anesthesia Chart Review:  Follows with cardiology for hx of coronary calcification and mild AS. She had a negative Lexiscan Myoview in 2019. Last seen by Dr. Percival Spanish 07/18/19, ordered followup echo done 10/13/19 that showed EF 60-65%, grade 1dd, stable Mild AS with mean gradient 17 mmHg.   Preop labs reviewed, unremarkable.   EKG 07/18/19: NSR. Rate 65.  TTE 10/13/19: 1. Left ventricular ejection fraction, by estimation, is 60 to 65%. The  left ventricle has normal function. The left ventricle has no regional  wall motion abnormalities. There is mild left ventricular hypertrophy.  Left ventricular diastolic parameters  are consistent with Grade I diastolic dysfunction (impaired relaxation).  Elevated left atrial pressure.  2. Right ventricular systolic function is normal. The right ventricular  size is normal. There is mildly elevated pulmonary artery systolic  pressure.  3. Trivial mitral valve regurgitation.  4. AV is trileaflet It is thickened, calcified with very mildly resricted  motion. Peak and mean gradients through the valve are 27 ang 17 mm Hg  consistent with mild AS.Marland Kitchen The aortic valve is tricuspid. Aortic valve  regurgitation is trivial. Mild aortic  valve stenosis.   Nuclear stress 07/18/18:  The left ventricular ejection fraction is normal (55-65%).  Nuclear stress EF: 64%.  There was no ST segment deviation noted during stress.  The study is normal.   1. EF 64%, normal wall motion.  2. No evidence for ischemia or infarction by perfusion defects.   Normal study.    [facility]/Anesthesiology Phone [phone] 10/31/2019 8:53 AM

## 2019-11-03 ENCOUNTER — Inpatient Hospital Stay (HOSPITAL_COMMUNITY): Payer: PPO

## 2019-11-03 ENCOUNTER — Inpatient Hospital Stay (HOSPITAL_COMMUNITY): Payer: PPO | Admitting: Physician Assistant

## 2019-11-03 ENCOUNTER — Encounter (HOSPITAL_COMMUNITY)
Admission: RE | Disposition: A | Discharge status: Home Health | Payer: Self-pay | Source: Home / Self Care | Attending: Neurosurgery

## 2019-11-03 ENCOUNTER — Inpatient Hospital Stay (HOSPITAL_COMMUNITY)
Admission: RE | Admit: 2019-11-03 | Discharge: 2019-11-04 | DRG: 455 | Disposition: A | Discharge status: Home Health | Payer: PPO | Attending: Neurosurgery | Admitting: Neurosurgery

## 2019-11-03 ENCOUNTER — Encounter (HOSPITAL_COMMUNITY): Payer: Self-pay | Admitting: Neurosurgery

## 2019-11-03 ENCOUNTER — Other Ambulatory Visit: Payer: Self-pay

## 2019-11-03 ENCOUNTER — Inpatient Hospital Stay (HOSPITAL_COMMUNITY): Payer: PPO | Admitting: Anesthesiology

## 2019-11-03 DIAGNOSIS — Z833 Family history of diabetes mellitus: Secondary | ICD-10-CM

## 2019-11-03 DIAGNOSIS — M4726 Other spondylosis with radiculopathy, lumbar region: Secondary | ICD-10-CM | POA: Diagnosis present

## 2019-11-03 DIAGNOSIS — G2581 Restless legs syndrome: Secondary | ICD-10-CM | POA: Diagnosis not present

## 2019-11-03 DIAGNOSIS — Z85820 Personal history of malignant melanoma of skin: Secondary | ICD-10-CM

## 2019-11-03 DIAGNOSIS — Z419 Encounter for procedure for purposes other than remedying health state, unspecified: Secondary | ICD-10-CM

## 2019-11-03 DIAGNOSIS — M5146 Schmorl's nodes, lumbar region: Secondary | ICD-10-CM | POA: Diagnosis not present

## 2019-11-03 DIAGNOSIS — Z90710 Acquired absence of both cervix and uterus: Secondary | ICD-10-CM | POA: Diagnosis not present

## 2019-11-03 DIAGNOSIS — M5116 Intervertebral disc disorders with radiculopathy, lumbar region: Secondary | ICD-10-CM | POA: Diagnosis present

## 2019-11-03 DIAGNOSIS — M4316 Spondylolisthesis, lumbar region: Secondary | ICD-10-CM | POA: Diagnosis present

## 2019-11-03 DIAGNOSIS — M48062 Spinal stenosis, lumbar region with neurogenic claudication: Secondary | ICD-10-CM | POA: Diagnosis present

## 2019-11-03 DIAGNOSIS — E785 Hyperlipidemia, unspecified: Secondary | ICD-10-CM | POA: Diagnosis not present

## 2019-11-03 DIAGNOSIS — M4326 Fusion of spine, lumbar region: Secondary | ICD-10-CM | POA: Diagnosis not present

## 2019-11-03 DIAGNOSIS — Z8249 Family history of ischemic heart disease and other diseases of the circulatory system: Secondary | ICD-10-CM | POA: Diagnosis not present

## 2019-11-03 DIAGNOSIS — Z981 Arthrodesis status: Secondary | ICD-10-CM | POA: Diagnosis not present

## 2019-11-03 SURGERY — POSTERIOR LUMBAR FUSION 1 LEVEL
Anesthesia: General | Site: Back

## 2019-11-03 MED ORDER — DIPHENHYDRAMINE HCL 50 MG/ML IJ SOLN
INTRAMUSCULAR | Status: AC
  Filled 2019-11-03: qty 1

## 2019-11-03 MED ORDER — OXYCODONE HCL 5 MG PO TABS: 5.0000 mg | ORAL_TABLET | ORAL | Status: DC | PRN

## 2019-11-03 MED ORDER — FENTANYL CITRATE (PF) 100 MCG/2ML IJ SOLN
25.0000 ug | INTRAMUSCULAR | Status: DC | PRN
  Administered 2019-11-03: 16:00:00 25 ug via INTRAVENOUS
  Administered 2019-11-03: 50 ug via INTRAVENOUS

## 2019-11-03 MED ORDER — PHENOL 1.4 % MT LIQD: 1.0000 | OROMUCOSAL | Status: DC | PRN

## 2019-11-03 MED ORDER — FENTANYL CITRATE (PF) 250 MCG/5ML IJ SOLN
INTRAMUSCULAR | Status: AC
  Filled 2019-11-03: qty 5

## 2019-11-03 MED ORDER — CEFAZOLIN SODIUM-DEXTROSE 2-4 GM/100ML-% IV SOLN
2.0000 g | INTRAVENOUS | Status: AC
  Administered 2019-11-03: 2 g via INTRAVENOUS

## 2019-11-03 MED ORDER — OXYCODONE HCL 5 MG PO TABS
10.0000 mg | ORAL_TABLET | ORAL | Status: DC | PRN
  Administered 2019-11-03 – 2019-11-04 (×4): 10 mg via ORAL
  Filled 2019-11-03 (×3): qty 2

## 2019-11-03 MED ORDER — THROMBIN 5000 UNITS EX SOLR
CUTANEOUS | Status: AC
  Filled 2019-11-03: qty 5000

## 2019-11-03 MED ORDER — ACETAMINOPHEN 500 MG PO TABS
ORAL_TABLET | ORAL | Status: AC
  Administered 2019-11-03: 10:00:00 1000 mg via ORAL
  Filled 2019-11-03: qty 2

## 2019-11-03 MED ORDER — THROMBIN 5000 UNITS EX SOLR: OROMUCOSAL | Status: DC | PRN

## 2019-11-03 MED ORDER — CEFAZOLIN SODIUM-DEXTROSE 2-4 GM/100ML-% IV SOLN
INTRAVENOUS | Status: AC
  Filled 2019-11-03: qty 100

## 2019-11-03 MED ORDER — DOCUSATE SODIUM 100 MG PO CAPS
100.0000 mg | ORAL_CAPSULE | Freq: Two times a day (BID) | ORAL | Status: DC
  Administered 2019-11-03 – 2019-11-04 (×2): 100 mg via ORAL
  Filled 2019-11-03 (×2): qty 1

## 2019-11-03 MED ORDER — MENTHOL 3 MG MT LOZG: 1.0000 | LOZENGE | OROMUCOSAL | Status: DC | PRN

## 2019-11-03 MED ORDER — ACETAMINOPHEN 325 MG PO TABS: 650.0000 mg | ORAL_TABLET | ORAL | Status: DC | PRN

## 2019-11-03 MED ORDER — SODIUM CHLORIDE 0.9% FLUSH: 3.0000 mL | Freq: Two times a day (BID) | INTRAVENOUS | Status: DC

## 2019-11-03 MED ORDER — ONDANSETRON HCL 4 MG/2ML IJ SOLN
INTRAMUSCULAR | Status: AC
  Filled 2019-11-03: qty 2

## 2019-11-03 MED ORDER — MORPHINE SULFATE (PF) 4 MG/ML IV SOLN: 4.0000 mg | INTRAVENOUS | Status: DC | PRN

## 2019-11-03 MED ORDER — EPHEDRINE SULFATE 50 MG/ML IJ SOLN
INTRAMUSCULAR | Status: DC | PRN
  Administered 2019-11-03 (×8): 5 mg via INTRAVENOUS

## 2019-11-03 MED ORDER — ROCURONIUM BROMIDE 10 MG/ML (PF) SYRINGE
PREFILLED_SYRINGE | INTRAVENOUS | Status: DC | PRN
  Administered 2019-11-03: 10 mg via INTRAVENOUS
  Administered 2019-11-03: 20 mg via INTRAVENOUS
  Administered 2019-11-03: 50 mg via INTRAVENOUS

## 2019-11-03 MED ORDER — LIDOCAINE-EPINEPHRINE 1 %-1:100000 IJ SOLN
INTRAMUSCULAR | Status: DC | PRN
  Administered 2019-11-03: 10 mL

## 2019-11-03 MED ORDER — ROCURONIUM BROMIDE 10 MG/ML (PF) SYRINGE
PREFILLED_SYRINGE | INTRAVENOUS | Status: AC
  Filled 2019-11-03: qty 10

## 2019-11-03 MED ORDER — ONDANSETRON HCL 4 MG/2ML IJ SOLN: 4.0000 mg | Freq: Four times a day (QID) | INTRAMUSCULAR | Status: DC | PRN

## 2019-11-03 MED ORDER — ONDANSETRON HCL 4 MG PO TABS: 4.0000 mg | ORAL_TABLET | Freq: Four times a day (QID) | ORAL | Status: DC | PRN

## 2019-11-03 MED ORDER — ONDANSETRON HCL 4 MG/2ML IJ SOLN
INTRAMUSCULAR | Status: DC | PRN
  Administered 2019-11-03: 4 mg via INTRAVENOUS

## 2019-11-03 MED ORDER — DEXAMETHASONE SODIUM PHOSPHATE 10 MG/ML IJ SOLN
INTRAMUSCULAR | Status: AC
  Filled 2019-11-03: qty 2

## 2019-11-03 MED ORDER — THROMBIN 20000 UNITS EX SOLR: CUTANEOUS | Status: DC | PRN

## 2019-11-03 MED ORDER — BACITRACIN ZINC 500 UNIT/GM EX OINT
TOPICAL_OINTMENT | CUTANEOUS | Status: AC
  Filled 2019-11-03: qty 28.35

## 2019-11-03 MED ORDER — OXYCODONE HCL 5 MG PO TABS
ORAL_TABLET | ORAL | Status: AC
  Filled 2019-11-03: qty 2

## 2019-11-03 MED ORDER — SODIUM CHLORIDE 0.9% FLUSH: 3.0000 mL | INTRAVENOUS | Status: DC | PRN

## 2019-11-03 MED ORDER — CHLORHEXIDINE GLUCONATE CLOTH 2 % EX PADS: 6.0000 | MEDICATED_PAD | Freq: Once | CUTANEOUS | Status: DC

## 2019-11-03 MED ORDER — LIDOCAINE-EPINEPHRINE 1 %-1:100000 IJ SOLN
INTRAMUSCULAR | Status: AC
  Filled 2019-11-03: qty 1

## 2019-11-03 MED ORDER — THROMBIN 20000 UNITS EX SOLR
CUTANEOUS | Status: AC
  Filled 2019-11-03: qty 20000

## 2019-11-03 MED ORDER — BACITRACIN ZINC 500 UNIT/GM EX OINT
TOPICAL_OINTMENT | CUTANEOUS | Status: DC | PRN
  Administered 2019-11-03: 1 via TOPICAL

## 2019-11-03 MED ORDER — PROMETHAZINE HCL 25 MG/ML IJ SOLN: 6.2500 mg | INTRAMUSCULAR | Status: DC | PRN

## 2019-11-03 MED ORDER — ZOLPIDEM TARTRATE 5 MG PO TABS: 5.0000 mg | ORAL_TABLET | Freq: Every evening | ORAL | Status: DC | PRN

## 2019-11-03 MED ORDER — FENTANYL CITRATE (PF) 100 MCG/2ML IJ SOLN
INTRAMUSCULAR | Status: AC
  Filled 2019-11-03: qty 2

## 2019-11-03 MED ORDER — DIPHENHYDRAMINE HCL 50 MG/ML IJ SOLN
INTRAMUSCULAR | Status: DC | PRN
  Administered 2019-11-03: 6.25 mg via INTRAVENOUS

## 2019-11-03 MED ORDER — LACTATED RINGERS IV SOLN: INTRAVENOUS | Status: DC

## 2019-11-03 MED ORDER — PROPOFOL 10 MG/ML IV BOLUS
INTRAVENOUS | Status: AC
  Filled 2019-11-03: qty 40

## 2019-11-03 MED ORDER — PRAMIPEXOLE DIHYDROCHLORIDE 0.25 MG PO TABS
0.2500 mg | ORAL_TABLET | Freq: Two times a day (BID) | ORAL | Status: DC
  Administered 2019-11-03 – 2019-11-04 (×2): 0.25 mg via ORAL
  Filled 2019-11-03 (×2): qty 1

## 2019-11-03 MED ORDER — ACETAMINOPHEN 500 MG PO TABS: 1000.0000 mg | ORAL_TABLET | Freq: Once | ORAL | Status: AC

## 2019-11-03 MED ORDER — SUGAMMADEX SODIUM 200 MG/2ML IV SOLN
INTRAVENOUS | Status: DC | PRN
  Administered 2019-11-03: 200 mg via INTRAVENOUS

## 2019-11-03 MED ORDER — DEXAMETHASONE SODIUM PHOSPHATE 10 MG/ML IJ SOLN
INTRAMUSCULAR | Status: DC | PRN
  Administered 2019-11-03: 10 mg via INTRAVENOUS

## 2019-11-03 MED ORDER — MIDAZOLAM HCL 2 MG/2ML IJ SOLN
INTRAMUSCULAR | Status: AC
  Filled 2019-11-03: qty 2

## 2019-11-03 MED ORDER — BISACODYL 10 MG RE SUPP: 10.0000 mg | Freq: Every day | RECTAL | Status: DC | PRN

## 2019-11-03 MED ORDER — SODIUM CHLORIDE 0.9 % IV SOLN: 250.0000 mL | INTRAVENOUS | Status: DC

## 2019-11-03 MED ORDER — ACETAMINOPHEN 650 MG RE SUPP: 650.0000 mg | RECTAL | Status: DC | PRN

## 2019-11-03 MED ORDER — LIDOCAINE 2% (20 MG/ML) 5 ML SYRINGE
INTRAMUSCULAR | Status: DC | PRN
  Administered 2019-11-03: 60 mg via INTRAVENOUS

## 2019-11-03 MED ORDER — ACETAMINOPHEN 500 MG PO TABS
1000.0000 mg | ORAL_TABLET | Freq: Four times a day (QID) | ORAL | Status: DC
  Administered 2019-11-03 (×2): 1000 mg via ORAL
  Filled 2019-11-03 (×2): qty 2

## 2019-11-03 MED ORDER — FENTANYL CITRATE (PF) 250 MCG/5ML IJ SOLN
INTRAMUSCULAR | Status: DC | PRN
  Administered 2019-11-03: 50 ug via INTRAVENOUS
  Administered 2019-11-03: 25 ug via INTRAVENOUS
  Administered 2019-11-03: 50 ug via INTRAVENOUS
  Administered 2019-11-03: 25 ug via INTRAVENOUS
  Administered 2019-11-03: 50 ug via INTRAVENOUS

## 2019-11-03 MED ORDER — HYDROCODONE-ACETAMINOPHEN 5-325 MG PO TABS: 1.0000 | ORAL_TABLET | ORAL | Status: DC | PRN

## 2019-11-03 MED ORDER — PROPOFOL 10 MG/ML IV BOLUS
INTRAVENOUS | Status: DC | PRN
  Administered 2019-11-03: 80 mg via INTRAVENOUS

## 2019-11-03 MED ORDER — BUPIVACAINE LIPOSOME 1.3 % IJ SUSP
INTRAMUSCULAR | Status: DC | PRN
  Administered 2019-11-03: 20 mL

## 2019-11-03 MED ORDER — 0.9 % SODIUM CHLORIDE (POUR BTL) OPTIME
TOPICAL | Status: DC | PRN
  Administered 2019-11-03: 13:00:00 1000 mL

## 2019-11-03 MED ORDER — SODIUM CHLORIDE 0.9 % IV SOLN: INTRAVENOUS | Status: DC | PRN

## 2019-11-03 MED ORDER — TIZANIDINE HCL 4 MG PO TABS
4.0000 mg | ORAL_TABLET | Freq: Four times a day (QID) | ORAL | Status: DC | PRN
  Administered 2019-11-03 – 2019-11-04 (×2): 4 mg via ORAL
  Filled 2019-11-03 (×2): qty 1

## 2019-11-03 MED ORDER — ALBUMIN HUMAN 5 % IV SOLN: INTRAVENOUS | Status: DC | PRN

## 2019-11-03 MED ORDER — CEFAZOLIN SODIUM-DEXTROSE 2-4 GM/100ML-% IV SOLN
2.0000 g | Freq: Three times a day (TID) | INTRAVENOUS | Status: AC
  Administered 2019-11-03 – 2019-11-04 (×2): 2 g via INTRAVENOUS
  Filled 2019-11-03 (×2): qty 100

## 2019-11-03 SURGICAL SUPPLY — 74 items
BAG DECANTER FOR FLEXI CONT (MISCELLANEOUS) ×3 IMPLANT
BASKET BONE COLLECTION (BASKET) ×3 IMPLANT
BENZOIN TINCTURE PRP APPL 2/3 (GAUZE/BANDAGES/DRESSINGS) ×3 IMPLANT
BLADE CLIPPER SURG (BLADE) IMPLANT
BUR MATCHSTICK NEURO 3.0 LAGG (BURR) ×3 IMPLANT
BUR PRECISION FLUTE 6.0 (BURR) ×3 IMPLANT
CANISTER SUCT 3000ML PPV (MISCELLANEOUS) ×3 IMPLANT
CAP LOCK DLX THRD (Cap) ×12 IMPLANT
CARTRIDGE OIL MAESTRO DRILL (MISCELLANEOUS) ×1 IMPLANT
CLOSURE WOUND 1/2 X4 (GAUZE/BANDAGES/DRESSINGS) ×1
CNTNR URN SCR LID CUP LEK RST (MISCELLANEOUS) ×1 IMPLANT
CONT SPEC 4OZ STRL OR WHT (MISCELLANEOUS) ×3
COVER BACK TABLE 60X90IN (DRAPES) ×3 IMPLANT
COVER WAND RF STERILE (DRAPES) IMPLANT
DECANTER SPIKE VIAL GLASS SM (MISCELLANEOUS) ×3 IMPLANT
DIFFUSER DRILL AIR PNEUMATIC (MISCELLANEOUS) ×3 IMPLANT
DRAPE C-ARM 42X72 X-RAY (DRAPES) ×6 IMPLANT
DRAPE HALF SHEET 40X57 (DRAPES) ×3 IMPLANT
DRAPE LAPAROTOMY 100X72X124 (DRAPES) ×3 IMPLANT
DRAPE SURG 17X23 STRL (DRAPES) ×12 IMPLANT
DRSG OPSITE POSTOP 4X6 (GAUZE/BANDAGES/DRESSINGS) ×3 IMPLANT
ELECT BLADE 4.0 EZ CLEAN MEGAD (MISCELLANEOUS) ×6
ELECT REM PT RETURN 9FT ADLT (ELECTROSURGICAL) ×3
ELECTRODE BLDE 4.0 EZ CLN MEGD (MISCELLANEOUS) ×2 IMPLANT
ELECTRODE REM PT RTRN 9FT ADLT (ELECTROSURGICAL) ×1 IMPLANT
EVACUATOR 1/8 PVC DRAIN (DRAIN) IMPLANT
GAUZE 4X4 16PLY RFD (DISPOSABLE) IMPLANT
GAUZE SPONGE 4X4 12PLY STRL (GAUZE/BANDAGES/DRESSINGS) IMPLANT
GLOVE BIO SURGEON STRL SZ 6.5 (GLOVE) ×2 IMPLANT
GLOVE BIO SURGEON STRL SZ8 (GLOVE) ×6 IMPLANT
GLOVE BIO SURGEON STRL SZ8.5 (GLOVE) ×6 IMPLANT
GLOVE BIO SURGEONS STRL SZ 6.5 (GLOVE) ×1
GLOVE BIOGEL PI IND STRL 6.5 (GLOVE) ×1 IMPLANT
GLOVE BIOGEL PI IND STRL 7.5 (GLOVE) ×3 IMPLANT
GLOVE BIOGEL PI IND STRL 8 (GLOVE) ×2 IMPLANT
GLOVE BIOGEL PI INDICATOR 6.5 (GLOVE) ×2
GLOVE BIOGEL PI INDICATOR 7.5 (GLOVE) ×6
GLOVE BIOGEL PI INDICATOR 8 (GLOVE) ×4
GLOVE ECLIPSE 7.5 STRL STRAW (GLOVE) ×15 IMPLANT
GLOVE EXAM NITRILE XL STR (GLOVE) IMPLANT
GLOVE SURG SS PI 7.0 STRL IVOR (GLOVE) ×3 IMPLANT
GOWN STRL REUS W/ TWL LRG LVL3 (GOWN DISPOSABLE) ×1 IMPLANT
GOWN STRL REUS W/ TWL XL LVL3 (GOWN DISPOSABLE) ×3 IMPLANT
GOWN STRL REUS W/TWL 2XL LVL3 (GOWN DISPOSABLE) ×3 IMPLANT
GOWN STRL REUS W/TWL LRG LVL3 (GOWN DISPOSABLE) ×3
GOWN STRL REUS W/TWL XL LVL3 (GOWN DISPOSABLE) ×9
HEMOSTAT POWDER KIT SURGIFOAM (HEMOSTASIS) ×3 IMPLANT
KIT BASIN OR (CUSTOM PROCEDURE TRAY) ×3 IMPLANT
KIT TURNOVER KIT B (KITS) ×3 IMPLANT
MILL MEDIUM DISP (BLADE) IMPLANT
NEEDLE HYPO 21X1.5 SAFETY (NEEDLE) ×3 IMPLANT
NEEDLE HYPO 22GX1.5 SAFETY (NEEDLE) ×3 IMPLANT
NS IRRIG 1000ML POUR BTL (IV SOLUTION) ×3 IMPLANT
OIL CARTRIDGE MAESTRO DRILL (MISCELLANEOUS) ×3
PACK LAMINECTOMY NEURO (CUSTOM PROCEDURE TRAY) ×3 IMPLANT
PAD ARMBOARD 7.5X6 YLW CONV (MISCELLANEOUS) ×12 IMPLANT
PATTIES SURGICAL .5 X1 (DISPOSABLE) IMPLANT
PUTTY DBM 10CC CALC GRAN (Putty) ×3 IMPLANT
ROD CREO DLX CVD 6.35X40 (Rod) ×2 IMPLANT
ROD CURVED TI 6.35X40 (Rod) ×6 IMPLANT
SCREW PA DLX CREO 7.5X50 (Screw) ×12 IMPLANT
SPACER ALTERA 10X36 12-16 15D (Spacer) ×3 IMPLANT
SPONGE LAP 4X18 RFD (DISPOSABLE) IMPLANT
SPONGE NEURO XRAY DETECT 1X3 (DISPOSABLE) IMPLANT
SPONGE SURGIFOAM ABS GEL 100 (HEMOSTASIS) ×3 IMPLANT
STRIP CLOSURE SKIN 1/2X4 (GAUZE/BANDAGES/DRESSINGS) ×2 IMPLANT
SUT VIC AB 1 CT1 18XBRD ANBCTR (SUTURE) ×2 IMPLANT
SUT VIC AB 1 CT1 8-18 (SUTURE) ×6
SUT VIC AB 2-0 CP2 18 (SUTURE) ×6 IMPLANT
SYR 20ML LL LF (SYRINGE) ×3 IMPLANT
TOWEL GREEN STERILE (TOWEL DISPOSABLE) ×3 IMPLANT
TOWEL GREEN STERILE FF (TOWEL DISPOSABLE) ×3 IMPLANT
TRAY FOLEY MTR SLVR 16FR STAT (SET/KITS/TRAYS/PACK) ×3 IMPLANT
WATER STERILE IRR 1000ML POUR (IV SOLUTION) ×3 IMPLANT

## 2019-11-03 NOTE — Op Note (Signed)
Brief history: The patient is a 79 year old white female who has complained of back and right greater left leg pain consistent with neurogenic claudication.  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  I discussed the various treatment options with her.  She has decided to proceed with surgery after weighing the risks, benefits and alternatives.  Preoperative diagnosis: L4-5 spondylolisthesis, degenerative disc disease, spinal stenosis compressing both the L4 and the L5 nerve roots; lumbago; lumbar radiculopathy; neurogenic claudication; Schmorl's node  Postoperative diagnosis: The same  Procedure: Bilateral L4-5 laminotomy/foraminotomies/medial facetectomy to decompress the bilateral L4 and L5 nerve roots(the work required to do this was in addition to the work required to do the posterior lumbar interbody fusion because of the patient's spinal stenosis, facet arthropathy. Etc. requiring a wide decompression of the nerve roots.);  L4-5 transforaminal lumbar interbody fusion with local morselized autograft bone and Zimmer DBM; insertion of interbody prosthesis at L4-5 (globus peek expandable interbody prosthesis); posterior nonsegmental instrumentation from L4 to L5 with globus titanium pedicle screws and rods; posterior lateral arthrodesis at L4-5 with local morselized autograft bone and Zimmer DBM.  Surgeon: Dr. Earle Gell  Asst.: Arnetha Massy, NP  Anesthesia: Gen. endotracheal  Estimated blood loss: 250 cc  Drains: None  Complications: None  Description of procedure: The patient was brought to the operating room by the anesthesia team. General endotracheal anesthesia was induced. The patient was turned to the prone position on the Wilson frame. The patient's lumbosacral region was then prepared with Betadine scrub and Betadine solution. Sterile drapes were applied.  I then  injected the area to be incised with Marcaine with epinephrine solution. I then used the scalpel to make a linear midline incision over the L4-5 interspace. I then used electrocautery to perform a bilateral subperiosteal dissection exposing the spinous process and lamina of L4 and L5. We then obtained intraoperative radiograph to confirm our location. We then inserted the Verstrac retractor to provide exposure.  I began the decompression by using the high speed drill to perform laminotomies at L4-5 bilaterally. We then used the Kerrison punches to widen the laminotomy and removed the ligamentum flavum at L4-5 bilaterally. We used the Kerrison punches to remove the medial facets at L4-5 bilaterally.  We remove the right L4-5 facet.. We performed wide foraminotomies about the bilateral L4 and L5 nerve roots completing the decompression.  We now turned our attention to the posterior lumbar interbody fusion. I used a scalpel to incise the intervertebral disc at L4-5 bilaterally. I then performed a partial intervertebral discectomy at L4-5 bilaterally using the pituitary forceps. We prepared the vertebral endplates at L4 and L5 for the fusion by removing the soft tissues with the curettes. We then used the trial spacers to pick the appropriate sized interbody prosthesis. We prefilled his prosthesis with a combination of local morselized autograft bone that we obtained during the decompression as well as Zimmer DBM. We inserted the prefilled prosthesis into the interspace at L4-5 from the right, we then turned and expanded the prosthesis. There was a good snug fit of the prosthesis in the interspace. We then filled and the remainder of the intervertebral disc space with local morselized autograft bone and Zimmer DBM. This completed the posterior lumbar interbody arthrodesis.  During the decompression and insertion of the prosthesis the assistant protected the thecal sac and nerve roots with the D'Errico  retractor.  We now turned attention  to the instrumentation. Under fluoroscopic guidance we cannulated the bilateral L4 and L5 pedicles with the bone probe. We then removed the bone probe. We then tapped the pedicle with a 6.5 millimeter tap. We then removed the tap. We probed inside the tapped pedicle with a ball probe to rule out cortical breaches. We then inserted a 7.5 x 50 millimeter pedicle screw into the L4 and L5 pedicles bilaterally under fluoroscopic guidance. We then palpated along the medial aspect of the pedicles to rule out cortical breaches.  The medial border of the right L5 pedicle fractured so we removed it.  There were none. The nerve roots were not injured. We then connected the unilateral pedicle screws with a lordotic rod. We compressed the construct and secured the rod in place with the caps. We then tightened the caps appropriately. This completed the instrumentation from L4-5 bilaterally.  We now turned our attention to the posterior lateral arthrodesis at L4-5 bilaterally. We used the high-speed drill to decorticate the remainder of the facets, pars, transverse process at L4-5 bilaterally. We then applied a combination of local morselized autograft bone and Zimmer DBM over these decorticated posterior lateral structures. This completed the posterior lateral arthrodesis.  We then obtained hemostasis using bipolar electrocautery. We irrigated the wound out with bacitracin solution. We inspected the thecal sac and nerve roots and noted they were well decompressed. We then removed the retractor.  We injected Exparel . We reapproximated patient's thoracolumbar fascia with interrupted #1 Vicryl suture. We reapproximated patient's subcutaneous tissue with interrupted 2-0 Vicryl suture. The reapproximated patient's skin with Steri-Strips and benzoin. The wound was then coated with bacitracin ointment. A sterile dressing was applied. The drapes were removed. The patient was subsequently  returned to the supine position where they were extubated by the anesthesia team. He was then transported to the post anesthesia care unit in stable condition. All sponge instrument and needle counts were reportedly correct at the end of this case.

## 2019-11-03 NOTE — Transfer of Care (Signed)
Immediate Anesthesia Transfer of Care Note  Patient: Tiffany Washington  Procedure(s) Performed: POSTERIOR LUMBAR INTERBODY FUSION,INTERBODY PROSTHESIS, POSTERIOR INSTRUMENTATION LUMBAR FOUR - LUMBAR FIVE (N/A Back)  Patient Location: PACU  Anesthesia Type:General  Level of Consciousness: awake, alert , oriented and patient cooperative  Airway & Oxygen Therapy: Patient Spontanous Breathing and Patient connected to face mask oxygen  Post-op Assessment: Report given to RN and Post -op Vital signs reviewed and stable 122/84 100% spo2 HR 69  Post vital signs: Reviewed and stable  Last Vitals:  Vitals Value Taken Time  BP 122/58 11/03/19 1540  Temp    Pulse 63 11/03/19 1540  Resp 17 11/03/19 1540  SpO2 100 % 11/03/19 1540  Vitals shown include unvalidated device data.  Last Pain:  Vitals:   11/03/19 1011  TempSrc:   PainSc: 3       Patients Stated Pain Goal: 0 ([id number])  Complications: No apparent anesthesia complications

## 2019-11-03 NOTE — Progress Notes (Signed)
Subjective: The patient is somnolent but easily arousable.  She looks well.  She is in no apparent distress.  Objective: Vital signs in last 24 hours: Temp:  [97.6 F (36.4 C)-97.9 F (36.6 C)] 97.6 F (36.4 C) (04/05 1540) Pulse Rate:  [58-70] 70 (04/05 1555) Resp:  [15-18] 16 (04/05 1555) BP: (117-155)/(55-58) 117/55 (04/05 1555) SpO2:  [99 %-100 %] 100 % (04/05 1555) Weight:  [77.1 kg] 77.1 kg (04/05 0959) Estimated body mass index is 29.18 kg/m as calculated from the following:   Height as of this encounter: 5\' 4"  (1.626 m).   Weight as of this encounter: 77.1 kg.   Intake/Output from previous day: No intake/output data recorded. Intake/Output this shift: Total I/O In: 1450 [I.V.:1200; IV Piggyback:250] Out: 620 [Urine:220; Blood:400]  Physical exam the patient is somnolent but arousable.  She is moving her lower extremities well.  Lab Results: No results for input(s): WBC, HGB, HCT, PLT in the last 72 hours. BMET No results for input(s): NA, K, CL, CO2, GLUCOSE, BUN, CREATININE, CALCIUM in the last 72 hours.  Studies/Results: DG Lumbar Spine 2-3 Views  Result Date: 11/03/2019 CLINICAL DATA:  Surgical posterior fusion of L4-5. EXAM: LUMBAR SPINE - 2-3 VIEW; DG C-ARM 1-60 MIN FLUOROSCOPY TIME:  28 seconds. COMPARISON:  None. FINDINGS: Two intraoperative fluoroscopic images were obtained of the lower lumbar spine. These images demonstrate the patient be status post surgical posterior fusion of L4-5 with bilateral intrapedicular screw placement and interbody fusion. Good alignment of vertebral bodies is noted. IMPRESSION: Status post surgical posterior fusion of L4-5. Electronically Signed   By: Marijo Conception M.D.   On: 11/03/2019 15:30   DG Lumbar Spine 1 View  Result Date: 11/03/2019 CLINICAL DATA:  Localization L4-5 EXAM: LUMBAR SPINE - 1 VIEW COMPARISON:  October 21, 2019 FINDINGS: Overlying metallic marker is seen at the L4-L5 level based numbering from prior radiograph  of October 21, 2019. Disc height loss and facet arthropathy most notable in the lower lumbar spine. Scattered vascular calcifications are noted. IMPRESSION: Metallic marker at the XX123456 level Electronically Signed   By: Prudencio Pair M.D.   On: 11/03/2019 12:55   DG C-Arm 1-60 Min  Result Date: 11/03/2019 CLINICAL DATA:  Surgical posterior fusion of L4-5. EXAM: LUMBAR SPINE - 2-3 VIEW; DG C-ARM 1-60 MIN FLUOROSCOPY TIME:  28 seconds. COMPARISON:  None. FINDINGS: Two intraoperative fluoroscopic images were obtained of the lower lumbar spine. These images demonstrate the patient be status post surgical posterior fusion of L4-5 with bilateral intrapedicular screw placement and interbody fusion. Good alignment of vertebral bodies is noted. IMPRESSION: Status post surgical posterior fusion of L4-5. Electronically Signed   By: Marijo Conception M.D.   On: 11/03/2019 15:30    Assessment/Plan: The patient is doing well.  I spoke with her husband.  LOS: 0 days     Ophelia Charter 11/03/2019, 4:02 PM

## 2019-11-03 NOTE — H&P (Signed)
Subjective: The patient is a 79 year old white female who has complained of back and right greater left leg pain consistent with neurogenic claudication.  She has failed medical management and was worked up with lumbar x-rays and lumbar MRI which demonstrated an L4-5 spondylolisthesis and spinal stenosis.  I discussed the various treatment options with her.  She has decided to proceed with surgery.  Past Medical History:  Diagnosis Date  . Aortic stenosis    Mild  . Arthritis   . GERD (gastroesophageal reflux disease)    mild  . Heart murmur   . HOH (hard of hearing)   . 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 Restless leg syndrome   . Spondylolisthesis of lumbar region   . Wears dentures    upper  . Wears glasses     Past Surgical History:  Procedure Laterality Date  . CESAREAN SECTION    . COLONOSCOPY    . COLONOSCOPY W/ BIOPSIES AND POLYPECTOMY    . KNEE SURGERY Bilateral    Bilateral  . MULTIPLE TOOTH EXTRACTIONS    . POLYPECTOMY    . UPPER GASTROINTESTINAL ENDOSCOPY    . VAGINAL HYSTERECTOMY      Allergies  Allergen Reactions  . Carbamazepine Other (See Comments)    Doesn't remember   . Gabapentin Other (See Comments)    Cant sleep   . Indocin [Indomethacin] Other (See Comments)    Doesn't remember   . Meloxicam     Unknown reaction  . Pamelor [Nortriptyline Hcl]     insomnia  . Ropinirole     Speed the legs up.  . Sulfa Antibiotics Diarrhea and Nausea And Vomiting  . Voltaren [Diclofenac Sodium]     Unknown reaction    Social History   Tobacco Use  . Smoking status: Never Smoker  . Smokeless tobacco: Never Used  Substance Use Topics  . Alcohol use: No    Family History  Problem Relation Age of Onset  . Hypertension Mother   . Congestive Heart Failure Mother        Died, 10  . Heart failure Mother   . Pulmonary embolism Father        Died, 81  . CAD Brother 77       CABG  . Diabetes Brother   . Diabetes Sister   . Gout Son     Prior to Admission medications   Medication Sig Start Date End Date Taking? Authorizing Provider  acetaminophen (TYLENOL) 325 MG tablet Take 650 mg by mouth every 6 (six) hours as needed for moderate pain or headache.   Yes [provider]  Ascorbic Acid (VITAMIN C) 1000 MG tablet Take 1,000 mg by mouth daily.   Yes [provider]  aspirin EC 81 MG tablet Take 81 mg by mouth at bedtime.   Yes [provider]  B Complex Vitamins (B COMPLEX 100 PO) Take 1 tablet by mouth daily.   Yes [provider]  CRANBERRY PO Take 1 capsule by mouth daily.   Yes [provider]  HYDROcodone-acetaminophen (NORCO/VICODIN) 5-325 MG tablet Take 1 tablet by mouth every 4 (four) hours as needed for pain. 08/05/19  Yes [provider]  ibuprofen (ADVIL) 200 MG tablet Take 400 mg by mouth every 8 (eight) hours as needed for headache or moderate pain.   Yes [provider]  Multiple Vitamins-Minerals (MULTIVITAMIN WITH MINERALS) tablet Take 1 tablet by mouth daily.    Yes [provider]  pramipexole (MIRAPEX) 0.25 MG tablet Take 0.25 mg by mouth 2 (two) times daily.    Yes [provider]  Zinc 30 MG CAPS Take 30 mg by mouth daily.   Yes [provider]     Review of Systems  Positive ROS: As above  All other systems have been reviewed and were otherwise negative with the exception of those mentioned in the HPI and as above.  Objective: Vital signs in last 24 hours: Temp:  [97.9 F (36.6 C)] 97.9 F (36.6 C) (04/05 0959) Pulse Rate:  [58] 58 (04/05 0959) Resp:  [18] 18 (04/05 0959) BP: (155)/(57) 155/57 (04/05 0959) SpO2:  [100 %] 100 % (04/05 0959) Weight:  [77.1 kg] 77.1 kg (04/05 0959) Estimated body mass index is 29.18 kg/m as calculated from the following:   Height as of this encounter: 5\' 4"  (1.626 m).   Weight as of this encounter: 77.1 kg.   General Appearance: Alert Head: Normocephalic, without obvious  abnormality, atraumatic Eyes: PERRL, conjunctiva/corneas clear, EOM's intact,    Ears: Normal  Throat: Normal  Neck: Supple, Back: unremarkable Lungs: Clear to auscultation bilaterally, respirations unlabored Heart: Regular rate and rhythm, no murmur, rub or gallop Abdomen: Soft, non-tender Extremities: Extremities normal, atraumatic, no cyanosis or edema Skin: unremarkable  NEUROLOGIC:   Mental status: alert and oriented,Motor Exam - grossly normal Sensory Exam - grossly normal Reflexes:  Coordination - grossly normal Gait - grossly normal Balance - grossly normal Cranial Nerves: I: smell Not tested  II: visual acuity  OS: Normal  OD: Normal   II: visual fields Full to confrontation  II: pupils Equal, round, reactive to light  III,VII: ptosis None  III,IV,VI: extraocular muscles  Full ROM  V: mastication Normal  V: facial light touch sensation  Normal  V,VII: corneal reflex  Present  VII: facial muscle function - upper  Normal  VII: facial muscle function - lower Normal  VIII: hearing Not tested  IX: soft palate elevation  Normal  IX,X: gag reflex Present  XI: trapezius strength  5/5  XI: sternocleidomastoid strength 5/5  XI: neck flexion strength  5/5  XII: tongue strength  Normal    Data Review Lab Results  Component Value Date   WBC 6.1 10/30/2019   HGB 14.0 10/30/2019   HCT 42.6 10/30/2019   MCV 97.7 10/30/2019   PLT 296 10/30/2019   Lab Results  Component Value Date   NA 141 10/30/2019   K 3.8 10/30/2019   CL 104 10/30/2019   CO2 27 10/30/2019   BUN 14 10/30/2019   CREATININE 0.72 10/30/2019   GLUCOSE 94 10/30/2019   No results found for: INR, PROTIME  Assessment/Plan: L4-5 spondylolisthesis, facet arthropathy, spinal stenosis, lumbago, lumbar radiculopathy, neurogenic claudication: I have discussed the situation with the patient.  I have reviewed her imaging studies with her and pointed out the abnormalities.  We have discussed the various  treatment options including surgery.  I have described the surgical treatment option of an L4-5 decompression, instrumentation and fusion.  I have shown her surgical models.  I have given her a surgical pamphlet.  We have discussed the risks, benefits, alternatives, expected postoperative course, and likelihood of achieving our goals with surgery.  I have answered all her questions.  She has decided proceed with surgery.   Ophelia Charter 11/03/2019 11:28 AM

## 2019-11-03 NOTE — Anesthesia Procedure Notes (Signed)
Procedure Name: Intubation Date/Time: 11/03/2019 11:49 AM Performed by: Verdie Drown, CRNA Pre-anesthesia Checklist: Patient identified, Emergency Drugs available, Suction available and Patient being monitored Patient Re-evaluated:Patient Re-evaluated prior to induction Oxygen Delivery Method: Circle System Utilized Preoxygenation: Pre-oxygenation with 100% oxygen Induction Type: IV induction Ventilation: Mask ventilation without difficulty Laryngoscope Size: Mac and 3 Grade View: Grade II Tube type: Oral Tube size: 7.5 mm Number of attempts: 1 Airway Equipment and Method: Stylet and Oral airway Placement Confirmation: ETT inserted through vocal cords under direct vision,  positive ETCO2 and breath sounds checked- equal and bilateral Secured at: 21 cm Tube secured with: Tape Dental Injury: Teeth and Oropharynx as per pre-operative assessment  Comments: [place] performed

## 2019-11-03 NOTE — Progress Notes (Signed)
Orthopedic Tech Progress Note Patient Details:  [name] 1941-07-10 [id number] Patient has brace Patient ID: Tiffany Washington, female   DOB: Jul 09, 1941, 79 y.o.   MRN: [id number]   Tiffany Washington 11/03/2019, 5:30 PM

## 2019-11-03 NOTE — Anesthesia Postprocedure Evaluation (Signed)
Anesthesia Post Note  Patient: Tiffany Washington  Procedure(s) Performed: POSTERIOR LUMBAR INTERBODY FUSION,INTERBODY PROSTHESIS, POSTERIOR INSTRUMENTATION LUMBAR FOUR - LUMBAR FIVE (N/A Back)     Patient location during evaluation: PACU Anesthesia Type: General Level of consciousness: sedated Pain management: pain level controlled Vital Signs Assessment: post-procedure vital signs reviewed and stable Respiratory status: spontaneous breathing and respiratory function stable Cardiovascular status: stable Postop Assessment: no apparent nausea or vomiting Anesthetic complications: no    Last Vitals:  Vitals:   11/03/19 1620 11/03/19 1647  BP:  137/74  Pulse:  72  Resp:  20  Temp:  36.4 C  SpO2: 100% 98%    Last Pain:  Vitals:   11/03/19 1647  TempSrc: Oral  PainSc:                  Dwon Sky DANIEL

## 2019-11-04 LAB — BASIC METABOLIC PANEL
Anion gap: 11 (ref 5–15)
BUN: 12 mg/dL (ref 8–23)
CO2: 24 mmol/L (ref 22–32)
Calcium: 9 mg/dL (ref 8.9–10.3)
Chloride: 104 mmol/L (ref 98–111)
Creatinine, Ser: 0.65 mg/dL (ref 0.44–1.00)
GFR calc Af Amer: >60 mL/min (ref >60)
GFR calc non Af Amer: >60 mL/min (ref >60)
Glucose, Bld: 142 mg/dL — ABNORMAL HIGH (ref 70–99)
Potassium: 3.9 mmol/L (ref 3.5–5.1)
Sodium: 139 mmol/L (ref 135–145)

## 2019-11-04 LAB — CBC
HCT: 32.5 % — ABNORMAL LOW (ref 36.0–46.0)
Hemoglobin: 10.8 g/dL — ABNORMAL LOW (ref 12.0–15.0)
MCH: 32.2 pg (ref 26.0–34.0)
MCHC: 33.2 g/dL (ref 30.0–36.0)
MCV: 97 fL (ref 80.0–100.0)
Platelets: 235 10*3/uL (ref 150–400)
RBC: 3.35 MIL/uL — ABNORMAL LOW (ref 3.87–5.11)
RDW: 13.6 % (ref 11.5–15.5)
WBC: 15.3 10*3/uL — ABNORMAL HIGH (ref 4.0–10.5)
nRBC: 0 % (ref 0.0–0.2)

## 2019-11-04 MED ORDER — TIZANIDINE HCL 4 MG PO TABS: 4.0000 mg | ORAL_TABLET | Freq: Four times a day (QID) | ORAL | 0 refills | Status: DC | PRN

## 2019-11-04 MED ORDER — OXYCODONE HCL 10 MG PO TABS: 10.0000 mg | ORAL_TABLET | ORAL | 0 refills | Status: DC | PRN

## 2019-11-04 MED ORDER — DOCUSATE SODIUM 100 MG PO CAPS: 100.0000 mg | ORAL_CAPSULE | Freq: Two times a day (BID) | ORAL | 0 refills | Status: DC

## 2019-11-04 NOTE — Discharge Instructions (Addendum)
Wound Care Keep incision covered and dry for two days.    Do not put any creams, lotions, or ointments on incision. Leave steri-strips on back.  They will fall off by themselves. Activity Walk each and every day, increasing distance each day. No lifting greater than 5 lbs.  Avoid excessive neck motion. No driving for 2 weeks; may ride as a passenger locally.  Diet Resume your normal diet.  Return to Work Will be discussed at your follow up appointment. Call Your Doctor If Any of These Occur Redness, drainage, or swelling at the wound.  Temperature greater than 101 degrees. Severe pain not relieved by pain medication. Incision starts to come apart. Follow Up Appt Call today for appointment in 3 weeks [phone]) or for problems.  If you have any hardware placed in your spine, you will need an x-ray before your appointment.

## 2019-11-04 NOTE — TOC Transition Note (Signed)
Transition of Care [facility]) - CM/SW Discharge Note   Patient Details  Name: Tiffany Washington MRN: [id number] Date of Birth: May 08, 1941  [facility] CM/SW Contact:  Angelita Ingles, RN Phone Number: 11/04/2019, 11:21 AM   Clinical Narrative:    Patient discharging home with husband. HH PT has been set up through [employer]. CM spoke with Malachy Mood , info was provided. Agency will be in contact with patient. Patient has been made aware that agency will contact her. DME equipment has been previously setup and at bedside. Patient to discharge via private vehicle with husband. No further needs identified at this time.   Final next level of care: Home/Self Care Barriers to Discharge: No Barriers Identified   Patient Goals and CMS Choice   CMS Medicare.gov Compare Post Acute Care list provided to:: Patient Choice offered to / list presented to : Patient  Discharge Placement                       Discharge Plan and Services                          HH Arranged: PT [facility] Agency: [place] Date [place]: 11/04/19 Time [place]: 1120 Representative spoke with at [place]: [place] Determinants of Health ([place]) Interventions     Readmission Risk Interventions No flowsheet data found.

## 2019-11-04 NOTE — Discharge Summary (Signed)
Physician Discharge Summary     Providing Compassionate, Quality Care - Together   Patient ID: Tiffany Washington MRN: [id number] DOB/AGE: 1941/06/03 79 y.o.  Admit date: 11/03/2019 Discharge date: 11/04/2019  Admission Diagnoses: Spondylolisthesis of lumbar region  Discharge Diagnoses:  Active Problems:   Spondylolisthesis of lumbar region   Discharged Condition: good  Hospital Course: Patient was admitted to [place] following an L4-5 posterior fusion by Dr. Arnoldo Morale on 11/03/2019. Her postoperative course was uncomplicated. She has ambulated in the hall. Her pain is well-controlled with oral analgesics. She is ready for discharge home.  Consults: None  Significant Diagnostic Studies: DG Lumbar Spine 2-3 Views  Result Date: 11/03/2019 CLINICAL DATA:  Surgical posterior fusion of L4-5. EXAM: LUMBAR SPINE - 2-3 VIEW; DG C-ARM 1-60 MIN FLUOROSCOPY TIME:  28 seconds. COMPARISON:  None. FINDINGS: Two intraoperative fluoroscopic images were obtained of the lower lumbar spine. These images demonstrate the patient be status post surgical posterior fusion of L4-5 with bilateral intrapedicular screw placement and interbody fusion. Good alignment of vertebral bodies is noted. IMPRESSION: Status post surgical posterior fusion of L4-5. Electronically Signed   By: Marijo Conception M.D.   On: 11/03/2019 15:30   DG Lumbar Spine 1 View  Result Date: 11/03/2019 CLINICAL DATA:  Localization L4-5 EXAM: LUMBAR SPINE - 1 VIEW COMPARISON:  October 21, 2019 FINDINGS: Overlying metallic marker is seen at the L4-L5 level based numbering from prior radiograph of October 21, 2019. Disc height loss and facet arthropathy most notable in the lower lumbar spine. Scattered vascular calcifications are noted. IMPRESSION: Metallic marker at the XX123456 level Electronically Signed   By: Prudencio Pair M.D.   On: 11/03/2019 12:55   DG C-Arm 1-60 Min  Result Date: 11/03/2019 CLINICAL DATA:  Surgical posterior fusion of L4-5. EXAM: LUMBAR  SPINE - 2-3 VIEW; DG C-ARM 1-60 MIN FLUOROSCOPY TIME:  28 seconds. COMPARISON:  None. FINDINGS: Two intraoperative fluoroscopic images were obtained of the lower lumbar spine. These images demonstrate the patient be status post surgical posterior fusion of L4-5 with bilateral intrapedicular screw placement and interbody fusion. Good alignment of vertebral bodies is noted. IMPRESSION: Status post surgical posterior fusion of L4-5. Electronically Signed   By: Marijo Conception M.D.   On: 11/03/2019 15:30    Treatments: Surgery: Bilateral L4-5 laminotomy/foraminotomies/medial facetectomy to decompress the bilateral L4 and L5 nerve roots(the work required to do this was in addition to the work required to do the posterior lumbar interbody fusion because of the patient's spinal stenosis, facet arthropathy. Etc. requiring a wide decompression of the nerve roots.);  L4-5 transforaminal lumbar interbody fusion with local morselized autograft bone and Zimmer DBM; insertion of interbody prosthesis at L4-5 (globus peek expandable interbody prosthesis); posterior nonsegmental instrumentation from L4 to L5 with globus titanium pedicle screws and rods; posterior lateral arthrodesis at L4-5 with local morselized autograft bone and Zimmer DBM.   Discharge Exam: Blood pressure (!) 111/45, pulse 60, temperature 98.1 F (36.7 C), temperature source Oral, resp. rate 18, height 5\' 4"  (1.626 m), weight 77.1 kg, SpO2 98 %.   Alert and oriented x 4 PERRLA CN II-XII grossly intact MAE, Strength and sensation intact Incision is covered with Honeycomb dressing and Steri Strips; Dressing is clean, dry, and intact   Disposition: Discharge disposition: 01-Home or Self Care        Allergies as of 11/04/2019      Reactions   Carbamazepine Other (See Comments)   Doesn't remember    Indocin [indomethacin]  Other (See Comments)   Doesn't remember    Meloxicam    Unknown reaction   Voltaren [diclofenac Sodium]    Unknown  reaction   Gabapentin Other (See Comments)   Cant sleep    Pamelor [nortriptyline Hcl] Other (See Comments)   insomnia   Ropinirole    Speed the legs up.   Sulfa Antibiotics Diarrhea, Nausea And Vomiting      Medication List    STOP taking these medications   HYDROcodone-acetaminophen 5-325 MG tablet Commonly known as: NORCO/VICODIN   ibuprofen 200 MG tablet Commonly known as: ADVIL     TAKE these medications   acetaminophen 325 MG tablet Commonly known as: TYLENOL Take 650 mg by mouth every 6 (six) hours as needed for moderate pain or headache.   aspirin EC 81 MG tablet Take 81 mg by mouth at bedtime.   B COMPLEX 100 PO Take 1 tablet by mouth daily.   CRANBERRY PO Take 1 capsule by mouth daily.   docusate sodium 100 MG capsule Commonly known as: COLACE Take 1 capsule (100 mg total) by mouth 2 (two) times daily.   Mirapex 0.25 MG tablet Generic drug: pramipexole Take 0.25 mg by mouth 2 (two) times daily.   multivitamin with minerals tablet Take 1 tablet by mouth daily.   Oxycodone HCl 10 MG Tabs Take 1 tablet (10 mg total) by mouth every 4 (four) hours as needed for severe pain ((score 7 to 10)).   tiZANidine 4 MG tablet Commonly known as: ZANAFLEX Take 1 tablet (4 mg total) by mouth every 6 (six) hours as needed for muscle spasms.   vitamin C 1000 MG tablet Take 1,000 mg by mouth daily.   Zinc 30 MG Caps Take 30 mg by mouth daily.      Follow-up Information    Newman Pies, MD. Schedule an appointment as soon as possible for a visit in 2 week(s).   Specialty: Neurosurgery Contact information: [address] [place] [address] [phone]           Signed: Patricia Nettle 11/04/2019, 8:34 AM

## 2019-11-04 NOTE — Progress Notes (Signed)
Occupational Therapy Evaluation Patient Details Name: Tiffany Washington MRN: [id number] DOB: [birth year] Today's Date: 11/04/2019    History of Present Illness Patient is a 79 y/o female who presents s/p L4-5 PLIF 11/03/19. PMH includes aortic stenosis and restless leg syndrome.   Clinical Impression   Completed education regarding back precautions for ADL and functional mobility for ADL. Pt has difficulty with recalling precautions and demonstrating appropriate techniques. Pt states he husband can assist after DC. Recommend follow up with HHOT to facilitate return to PLOF.     Follow Up Recommendations  Home health OT;Supervision/Assistance - 24 hour    Equipment Recommendations  3 in 1 bedside commode    Recommendations for Other Services       Precautions / Restrictions Precautions Precautions: Fall;Back Precaution Booklet Issued: Yes (comment) Precaution Comments: Reviewed handout and back precautions      Mobility Bed Mobility Overal bed mobility: Needs Assistance Bed Mobility: Sidelying to Sit;Sit to Sidelying   Sidelying to sit: Min guard     Sit to sidelying: Min assist General bed mobility comments: vc for correct rolling to adhere to precautions  Transfers Overall transfer level: Needs assistance Equipment used: Rolling walker (2 wheeled) Transfers: Sit to/from [employer] Sit to Stand: Min assist Stand pivot transfers: Min guard       General transfer comment: Difficulty wiht achieveing sit - stand at [employer]; VC for correct technique    Balance Overall balance assessment: Needs assistance   Sitting balance-Leahy Scale: Good       Standing balance-Leahy Scale: Poor Standing balance comment: reliant on external support                           ADL either performed or assessed with clinical judgement   ADL Overall ADL's : Needs assistance/impaired     Grooming: Set up;Supervision/safety;Standing   Upper Body Bathing: Set  up;Supervision/ safety;Sitting   Lower Body Bathing: Moderate assistance;Sit to/from stand   Upper Body Dressing : Set up;Supervision/safety;Sitting   Lower Body Dressing: Moderate assistance;Sit to/from stand   Toilet Transfer: Minimal assistance;Ambulation   Toileting- Clothing Manipulation and Hygiene: Moderate assistance;Sit to/from stand       Functional mobility during ADLs: Minimal assistance;Rolling walker;Cueing for safety;Cueing for sequencing General ADL Comments: Pt's husband has been assisting her with LB ADL; pt would benefit form AE for LB ADL however has difficulty with problem solving how to use the AE; would benefit form further educaiton; Educated on corect donning/doffing of back kbrace - pt requires mod vc to correctly [employer]     Praxis      Pertinent Vitals/Pain Pain Assessment: Faces Faces Pain Scale: Hurts little more Pain Location: BACK Pain Descriptors / Indicators: Aching;Discomfort Pain Intervention(s): Limited activity within patient's tolerance     Hand Dominance Right   Extremity/Trunk Assessment Upper Extremity Assessment Upper Extremity Assessment: Overall WFL for tasks assessed   Lower Extremity Assessment Lower Extremity Assessment: Defer to PT evaluation   Cervical / Trunk Assessment Cervical / Trunk Assessment: Other exceptions Cervical / Trunk Exceptions: s/p back surgery   Communication Communication Communication: HOH   Cognition Arousal/Alertness: Awake/alert Behavior During Therapy: WFL for tasks assessed/performed Overall Cognitive Status: No family/caregiver present to determine baseline cognitive functioning Area of Impairment: Attention;Memory;Awareness                   Current Attention  Level: Selective Memory: Decreased recall of precautions;Decreased short-term memory     Awareness: Emergent   General Comments: dIFFICULTY RECALLING BACK PRECAUTIONS and difficulty  remembering how to donn brace correctly; pt did not sleep last night    General Comments       Exercises     Shoulder Instructions      Home Living Family/patient expects to be discharged to:: Private residence Living Arrangements: Spouse/significant other Available Help at Discharge: Family;Available 24 hours/day Type of Home: House Home Access: Stairs to enter [employer] of Steps: 1 small step   Home Layout: One level     Bathroom Shower/Tub: Teacher, early years/pre: Standard(has one of each) Bathroom Accessibility: Yes How Accessible: Accessible via walker Home Equipment: Cane - quad;Walker - 2 wheels;Grab bars - tub/shower          Prior Functioning/Environment Level of Independence: Needs assistance  Gait / Transfers Assistance Needed: Uses 2 quad canes for ambulation; reports no falls. Needs help getting up from the toilet some times. Loves to get in her garden but has but unable to do this. Reports doing some cleaning. Spouse does the vacuuming. ADL's / Homemaking Assistance Needed: needs helps washing her back. Communication / Swallowing Assistance Needed: hoh          OT Problem List: Decreased strength;Decreased activity tolerance;Impaired balance (sitting and/or standing);Decreased safety awareness;Decreased knowledge of use of DME or AE;Decreased knowledge of precautions;Pain      OT Treatment/Interventions:      OT Goals(Current goals can be found in the care plan section) Acute Rehab OT Goals Patient Stated Goal: to get back to gardening OT Goal Formulation: With patient Time For Goal Achievement: 11/18/19 Potential to Achieve Goals: Good  OT Frequency:     Barriers to D/C:            Co-evaluation              AM-PAC OT "6 Clicks" Daily Activity     Outcome Measure Help from another person eating meals?: None Help from another person taking care of personal grooming?: A Little Help from another person toileting,  which includes using toliet, bedpan, or urinal?: A Lot Help from another person bathing (including washing, rinsing, drying)?: A Lot Help from another person to put on and taking off regular upper body clothing?: A Little Help from another person to put on and taking off regular lower body clothing?: A Lot 6 Click Score: 16   End of Session Equipment Utilized During Treatment: Rolling walker;Back brace Nurse Communication: Mobility status;Other (comment)(review information with husband regarding precautions/brace)  Activity Tolerance: Patient tolerated treatment well Patient left: in bed;with call bell/phone within reach  OT Visit Diagnosis: Unsteadiness on feet (R26.81);Muscle weakness (generalized) (M62.81);Pain Pain - part of body: (back)                Time: [id number] OT Time Calculation (min): 35 min Charges:  OT General Charges $OT Visit: 1 Visit OT Evaluation $OT Eval Low Complexity: 1 Low OT Treatments $Self Care/Home Management : 8-22 mins  Maurie Boettcher, OT/L   Acute OT Clinical Specialist Acute Rehabilitation Services Pager [phone] Office [phone]   [facility] 11/04/2019, 2:02 PM

## 2019-11-04 NOTE — Plan of Care (Signed)
Patient alert and oriented, mae's well, voiding adequate amount of urine, swallowing without difficulty, no c/o pain at time of discharge. Patient discharged home with family. Script and discharged instructions given to patient. Patient and family stated understanding of instructions given. Patient has an appointment with Dr. Jenkins   

## 2019-11-04 NOTE — Evaluation (Signed)
Physical Therapy Evaluation Patient Details Name: Tiffany Washington MRN: [id number] DOB: July 07, 1941 Today's Date: 11/04/2019   History of Present Illness  Patient is a 79 y/o female who presents s/p L4-5 PLIF 11/03/19. PMH includes aortic stenosis and restless leg syndrome.  Clinical Impression  Patient presents with generalized weakness (RLE>LLE), impaired balance and impaired mobility s/p above surgery. PTA, pt Mod I with 2 quad canes, drives and does most of her ADLs. Reports no falls. Today, pt requires Min guard for bed mobility, Min A for standing and min guard-Min A for gait training with use of RW. Encouraged use of RW for home and to have spouse close by during mobility for the first few days for safety. Education re: back precautions, log roll technique, positioning, walking program, brace etc. Will follow acutely to maximize independence and mobility prior to return home.    Follow Up Recommendations Home health PT;Supervision for mobility/OOB    Equipment Recommendations  3in1 (PT)    Recommendations for Other Services       Precautions / Restrictions Precautions Precautions: Fall;Back Precaution Booklet Issued: Yes (comment) Precaution Comments: Reviewed handout and back precautions Required Braces or Orthoses: Spinal Brace Spinal Brace: Lumbar corset;Applied in sitting position Restrictions Weight Bearing Restrictions: No      Mobility  Bed Mobility Overal bed mobility: Needs Assistance Bed Mobility: Sidelying to Sit   Sidelying to sit: Min guard;HOB elevated       General bed mobility comments: Pt demonstrating how she gets out of bed at home, brings LEs to EOB and reaches Uses behind her to elevate trunk. Emphasized and instructed pt in log roll technique.  Transfers Overall transfer level: Needs assistance Equipment used: Rolling walker (2 wheeled) Transfers: Sit to/from Stand Sit to Stand: Min assist         General transfer comment: Assist to power to  standing with cues for hand placement. Stood from [employer].  Ambulation/Gait Ambulation/Gait assistance: Min assist;Min guard Gait Distance (Feet): 150 Feet Assistive device: Rolling walker (2 wheeled) Gait Pattern/deviations: Step-through pattern;Decreased stance time - right;Decreased stride length Gait velocity: decreased   General Gait Details: Slow, guarded gait with right knee instability during stance but no overt buckling or LOB. Cues for upright.  Stairs            Wheelchair Mobility    Modified Rankin (Stroke Patients Only)       Balance Overall balance assessment: Needs assistance Sitting-balance support: Feet supported;No upper extremity supported Sitting balance-Leahy Scale: Good Sitting balance - Comments: Assist to setup donning brace   Standing balance support: During functional activity Standing balance-Leahy Scale: Poor Standing balance comment: Requires UE support in standing and Min guard-Min A for walking.                             Pertinent Vitals/Pain Pain Assessment: No/denies pain    Home Living Family/patient expects to be discharged to:: Private residence Living Arrangements: Spouse/significant other Available Help at Discharge: Family;Available 24 hours/day Type of Home: House Home Access: Stairs to enter   [employer] of Steps: 1 small step Home Layout: One level Home Equipment: Cane - quad;Walker - 2 wheels;Grab bars - tub/shower      Prior Function Level of Independence: Needs assistance   Gait / Transfers Assistance Needed: Uses 2 quad canes for ambulation; reports no falls. Needs help getting up from the toilet some times. Loves to get in her garden but  has but unable to do this. Reports doing some cleaning. Spouse does the vacuuming.  ADL's / Homemaking Assistance Needed: needs helps washing her back.        Hand Dominance        Extremity/Trunk Assessment   Upper Extremity Assessment Upper  Extremity Assessment: Defer to OT evaluation    Lower Extremity Assessment Lower Extremity Assessment: RLE deficits/detail RLE Deficits / Details: Grossly ~4/5 throughout; decreased functional strength RLE Sensation: WNL RLE Coordination: WNL    Cervical / Trunk Assessment Cervical / Trunk Assessment: Other exceptions Cervical / Trunk Exceptions: s/p back surgery  Communication   Communication: HOH  Cognition Arousal/Alertness: Awake/alert Behavior During Therapy: WFL for tasks assessed/performed Overall Cognitive Status: No family/caregiver present to determine baseline cognitive functioning Area of Impairment: Memory                     Memory: Decreased short-term memory;Decreased recall of precautions         General Comments: for basic mobility tasks. Not able to recall back precautions from PA.      General Comments General comments (skin integrity, edema, etc.): Incision with mild weeping.    Exercises     Assessment/Plan    PT Assessment Patient needs continued PT services  PT Problem List Decreased strength;Decreased mobility;Decreased balance;Decreased skin integrity;Decreased cognition       PT Treatment Interventions Therapeutic activities;Gait training;Therapeutic exercise;Patient/family education;Balance training;Functional mobility training;Stair training    PT Goals (Current goals can be found in the Care Plan section)  Acute Rehab PT Goals Patient Stated Goal: to get back to gardening PT Goal Formulation: With patient Time For Goal Achievement: 11/18/19 Potential to Achieve Goals: Good    Frequency Min 5X/week   Barriers to discharge        Co-evaluation               AM-PAC PT "6 Clicks" Mobility  Outcome Measure Help needed turning from your back to your side while in a flat bed without using bedrails?: None Help needed moving from lying on your back to sitting on the side of a flat bed without using bedrails?: A  Little Help needed moving to and from a bed to a chair (including a wheelchair)?: A Little Help needed standing up from a chair using your arms (e.g., wheelchair or bedside chair)?: A Little Help needed to walk in hospital room?: A Little Help needed climbing 3-5 steps with a railing? : A Lot 6 Click Score: 18    End of Session Equipment Utilized During Treatment: Back brace;Gait belt Activity Tolerance: Patient tolerated treatment well Patient left: in bed;with call bell/phone within reach(sitting EOB awaiting OT) Nurse Communication: Mobility status PT Visit Diagnosis: Muscle weakness (generalized) (M62.81);Difficulty in walking, not elsewhere classified (R26.2)    Time: [id number] PT Time Calculation (min) (ACUTE ONLY): 22 min   Charges:   PT Evaluation $PT Eval Moderate Complexity: 1 Mod          Marisa Severin, PT, DPT Acute Rehabilitation Services Pager [phone] Office [phone]      Marguarite Arbour A Sabra Heck 11/04/2019, 9:27 AM

## 2019-11-06 MED FILL — Heparin Sodium (Porcine) Inj 1000 Unit/ML: INTRAMUSCULAR | Qty: 30 | Status: AC

## 2019-11-06 MED FILL — Sodium Chloride IV Soln 0.9%: INTRAVENOUS | Qty: 1000 | Status: AC

## 2019-11-11 DIAGNOSIS — M545 Low back pain: Secondary | ICD-10-CM | POA: Diagnosis not present

## 2019-11-11 DIAGNOSIS — R300 Dysuria: Secondary | ICD-10-CM | POA: Diagnosis not present

## 2019-11-11 DIAGNOSIS — R600 Localized edema: Secondary | ICD-10-CM | POA: Diagnosis not present

## 2019-11-24 DIAGNOSIS — M4316 Spondylolisthesis, lumbar region: Secondary | ICD-10-CM | POA: Diagnosis not present

## 2019-12-31 DIAGNOSIS — E785 Hyperlipidemia, unspecified: Secondary | ICD-10-CM | POA: Diagnosis not present

## 2019-12-31 DIAGNOSIS — E559 Vitamin D deficiency, unspecified: Secondary | ICD-10-CM | POA: Diagnosis not present

## 2019-12-31 DIAGNOSIS — I2510 Atherosclerotic heart disease of native coronary artery without angina pectoris: Secondary | ICD-10-CM | POA: Diagnosis not present

## 2019-12-31 DIAGNOSIS — M545 Low back pain: Secondary | ICD-10-CM | POA: Diagnosis not present

## 2020-01-08 ENCOUNTER — Other Ambulatory Visit: Payer: Self-pay | Admitting: Student

## 2020-01-08 DIAGNOSIS — M4316 Spondylolisthesis, lumbar region: Secondary | ICD-10-CM | POA: Diagnosis not present

## 2020-01-08 DIAGNOSIS — Z981 Arthrodesis status: Secondary | ICD-10-CM

## 2020-01-19 DIAGNOSIS — G5602 Carpal tunnel syndrome, left upper limb: Secondary | ICD-10-CM | POA: Diagnosis not present

## 2020-01-19 DIAGNOSIS — M79642 Pain in left hand: Secondary | ICD-10-CM | POA: Diagnosis not present

## 2020-01-26 ENCOUNTER — Ambulatory Visit
Admission: RE | Admit: 2020-01-26 | Discharge: 2020-01-26 | Disposition: A | Discharge status: Home / Self Care | Payer: PPO | Source: Ambulatory Visit | Attending: Student | Admitting: Student

## 2020-01-26 DIAGNOSIS — S32009A Unspecified fracture of unspecified lumbar vertebra, initial encounter for closed fracture: Secondary | ICD-10-CM | POA: Diagnosis not present

## 2020-01-26 DIAGNOSIS — Z981 Arthrodesis status: Secondary | ICD-10-CM

## 2020-02-10 DIAGNOSIS — Z6833 Body mass index (BMI) 33.0-33.9, adult: Secondary | ICD-10-CM | POA: Diagnosis not present

## 2020-02-10 DIAGNOSIS — Z1231 Encounter for screening mammogram for malignant neoplasm of breast: Secondary | ICD-10-CM | POA: Diagnosis not present

## 2020-02-10 DIAGNOSIS — Z01419 Encounter for gynecological examination (general) (routine) without abnormal findings: Secondary | ICD-10-CM | POA: Diagnosis not present

## 2020-02-12 DIAGNOSIS — S32020A Wedge compression fracture of second lumbar vertebra, initial encounter for closed fracture: Secondary | ICD-10-CM | POA: Diagnosis not present

## 2020-02-19 ENCOUNTER — Other Ambulatory Visit: Payer: Self-pay | Admitting: Neurosurgery

## 2020-02-19 DIAGNOSIS — S32020A Wedge compression fracture of second lumbar vertebra, initial encounter for closed fracture: Secondary | ICD-10-CM

## 2020-02-24 DIAGNOSIS — R21 Rash and other nonspecific skin eruption: Secondary | ICD-10-CM | POA: Diagnosis not present

## 2020-02-24 DIAGNOSIS — B029 Zoster without complications: Secondary | ICD-10-CM | POA: Diagnosis not present

## 2020-03-09 DIAGNOSIS — Z20818 Contact with and (suspected) exposure to other bacterial communicable diseases: Secondary | ICD-10-CM | POA: Diagnosis not present

## 2020-03-09 DIAGNOSIS — M545 Low back pain: Secondary | ICD-10-CM | POA: Diagnosis not present

## 2020-03-09 DIAGNOSIS — J069 Acute upper respiratory infection, unspecified: Secondary | ICD-10-CM | POA: Diagnosis not present

## 2020-03-09 DIAGNOSIS — R05 Cough: Secondary | ICD-10-CM | POA: Diagnosis not present

## 2020-03-11 DIAGNOSIS — M545 Low back pain: Secondary | ICD-10-CM | POA: Diagnosis not present

## 2020-03-11 DIAGNOSIS — M4326 Fusion of spine, lumbar region: Secondary | ICD-10-CM | POA: Diagnosis not present

## 2020-03-12 DIAGNOSIS — H2513 Age-related nuclear cataract, bilateral: Secondary | ICD-10-CM | POA: Diagnosis not present

## 2020-03-12 DIAGNOSIS — H5203 Hypermetropia, bilateral: Secondary | ICD-10-CM | POA: Diagnosis not present

## 2020-03-15 DIAGNOSIS — M545 Low back pain: Secondary | ICD-10-CM | POA: Diagnosis not present

## 2020-03-15 DIAGNOSIS — M4326 Fusion of spine, lumbar region: Secondary | ICD-10-CM | POA: Diagnosis not present

## 2020-03-19 DIAGNOSIS — M545 Low back pain: Secondary | ICD-10-CM | POA: Diagnosis not present

## 2020-03-19 DIAGNOSIS — M4326 Fusion of spine, lumbar region: Secondary | ICD-10-CM | POA: Diagnosis not present

## 2020-03-22 DIAGNOSIS — M545 Low back pain: Secondary | ICD-10-CM | POA: Diagnosis not present

## 2020-03-22 DIAGNOSIS — M4326 Fusion of spine, lumbar region: Secondary | ICD-10-CM | POA: Diagnosis not present

## 2020-05-14 ENCOUNTER — Other Ambulatory Visit: Payer: Self-pay

## 2020-05-14 ENCOUNTER — Ambulatory Visit
Admission: RE | Admit: 2020-05-14 | Discharge: 2020-05-14 | Disposition: A | Discharge status: Home / Self Care | Payer: PPO | Source: Ambulatory Visit | Attending: Neurosurgery | Admitting: Neurosurgery

## 2020-05-14 DIAGNOSIS — M8588 Other specified disorders of bone density and structure, other site: Secondary | ICD-10-CM | POA: Diagnosis not present

## 2020-05-14 DIAGNOSIS — S32020A Wedge compression fracture of second lumbar vertebra, initial encounter for closed fracture: Secondary | ICD-10-CM

## 2020-05-14 DIAGNOSIS — M810 Age-related osteoporosis without current pathological fracture: Secondary | ICD-10-CM | POA: Diagnosis not present

## 2020-05-14 DIAGNOSIS — Z780 Asymptomatic menopausal state: Secondary | ICD-10-CM | POA: Diagnosis not present

## 2020-06-07 DIAGNOSIS — M533 Sacrococcygeal disorders, not elsewhere classified: Secondary | ICD-10-CM | POA: Diagnosis not present

## 2020-06-07 DIAGNOSIS — Z981 Arthrodesis status: Secondary | ICD-10-CM | POA: Diagnosis not present

## 2020-06-30 DIAGNOSIS — E785 Hyperlipidemia, unspecified: Secondary | ICD-10-CM | POA: Diagnosis not present

## 2020-06-30 DIAGNOSIS — E559 Vitamin D deficiency, unspecified: Secondary | ICD-10-CM | POA: Diagnosis not present

## 2020-07-05 DIAGNOSIS — M461 Sacroiliitis, not elsewhere classified: Secondary | ICD-10-CM | POA: Diagnosis not present

## 2020-07-07 DIAGNOSIS — M545 Low back pain, unspecified: Secondary | ICD-10-CM | POA: Diagnosis not present

## 2020-07-07 DIAGNOSIS — R82998 Other abnormal findings in urine: Secondary | ICD-10-CM | POA: Diagnosis not present

## 2020-07-07 DIAGNOSIS — L309 Dermatitis, unspecified: Secondary | ICD-10-CM | POA: Diagnosis not present

## 2020-07-07 DIAGNOSIS — E785 Hyperlipidemia, unspecified: Secondary | ICD-10-CM | POA: Diagnosis not present

## 2020-07-07 DIAGNOSIS — Z1212 Encounter for screening for malignant neoplasm of rectum: Secondary | ICD-10-CM | POA: Diagnosis not present

## 2020-07-07 DIAGNOSIS — I350 Nonrheumatic aortic (valve) stenosis: Secondary | ICD-10-CM | POA: Diagnosis not present

## 2020-07-07 DIAGNOSIS — I2510 Atherosclerotic heart disease of native coronary artery without angina pectoris: Secondary | ICD-10-CM | POA: Diagnosis not present

## 2020-07-07 DIAGNOSIS — Z0000 Encounter for general adult medical examination without abnormal findings: Secondary | ICD-10-CM | POA: Diagnosis not present

## 2020-07-07 DIAGNOSIS — I10 Essential (primary) hypertension: Secondary | ICD-10-CM | POA: Diagnosis not present

## 2020-07-07 DIAGNOSIS — G5603 Carpal tunnel syndrome, bilateral upper limbs: Secondary | ICD-10-CM | POA: Diagnosis not present

## 2020-07-26 DIAGNOSIS — Z981 Arthrodesis status: Secondary | ICD-10-CM | POA: Diagnosis not present

## 2020-07-26 DIAGNOSIS — M461 Sacroiliitis, not elsewhere classified: Secondary | ICD-10-CM | POA: Diagnosis not present

## 2020-07-27 DIAGNOSIS — R262 Difficulty in walking, not elsewhere classified: Secondary | ICD-10-CM | POA: Diagnosis not present

## 2020-07-27 DIAGNOSIS — M461 Sacroiliitis, not elsewhere classified: Secondary | ICD-10-CM | POA: Diagnosis not present

## 2020-07-27 DIAGNOSIS — M25551 Pain in right hip: Secondary | ICD-10-CM | POA: Diagnosis not present

## 2020-07-31 DIAGNOSIS — I639 Cerebral infarction, unspecified: Secondary | ICD-10-CM

## 2020-07-31 HISTORY — DX: Cerebral infarction, unspecified: I63.9

## 2020-08-18 DIAGNOSIS — M461 Sacroiliitis, not elsewhere classified: Secondary | ICD-10-CM | POA: Diagnosis not present

## 2020-08-18 DIAGNOSIS — R262 Difficulty in walking, not elsewhere classified: Secondary | ICD-10-CM | POA: Diagnosis not present

## 2020-08-18 DIAGNOSIS — M25551 Pain in right hip: Secondary | ICD-10-CM | POA: Diagnosis not present

## 2020-08-23 DIAGNOSIS — M25551 Pain in right hip: Secondary | ICD-10-CM | POA: Diagnosis not present

## 2020-08-23 DIAGNOSIS — M461 Sacroiliitis, not elsewhere classified: Secondary | ICD-10-CM | POA: Diagnosis not present

## 2020-08-23 DIAGNOSIS — R262 Difficulty in walking, not elsewhere classified: Secondary | ICD-10-CM | POA: Diagnosis not present

## 2020-08-25 DIAGNOSIS — R262 Difficulty in walking, not elsewhere classified: Secondary | ICD-10-CM | POA: Diagnosis not present

## 2020-08-25 DIAGNOSIS — M25551 Pain in right hip: Secondary | ICD-10-CM | POA: Diagnosis not present

## 2020-08-25 DIAGNOSIS — M461 Sacroiliitis, not elsewhere classified: Secondary | ICD-10-CM | POA: Diagnosis not present

## 2020-08-26 DIAGNOSIS — H25813 Combined forms of age-related cataract, bilateral: Secondary | ICD-10-CM | POA: Diagnosis not present

## 2020-08-26 DIAGNOSIS — H353112 Nonexudative age-related macular degeneration, right eye, intermediate dry stage: Secondary | ICD-10-CM | POA: Diagnosis not present

## 2020-08-26 DIAGNOSIS — H5203 Hypermetropia, bilateral: Secondary | ICD-10-CM | POA: Diagnosis not present

## 2020-08-26 DIAGNOSIS — H43813 Vitreous degeneration, bilateral: Secondary | ICD-10-CM | POA: Diagnosis not present

## 2020-08-31 DIAGNOSIS — H25812 Combined forms of age-related cataract, left eye: Secondary | ICD-10-CM | POA: Diagnosis not present

## 2020-08-31 DIAGNOSIS — H268 Other specified cataract: Secondary | ICD-10-CM | POA: Diagnosis not present

## 2020-09-03 DIAGNOSIS — R262 Difficulty in walking, not elsewhere classified: Secondary | ICD-10-CM | POA: Diagnosis not present

## 2020-09-03 DIAGNOSIS — M461 Sacroiliitis, not elsewhere classified: Secondary | ICD-10-CM | POA: Diagnosis not present

## 2020-09-03 DIAGNOSIS — M25551 Pain in right hip: Secondary | ICD-10-CM | POA: Diagnosis not present

## 2020-09-07 DIAGNOSIS — H268 Other specified cataract: Secondary | ICD-10-CM | POA: Diagnosis not present

## 2020-09-07 DIAGNOSIS — H25811 Combined forms of age-related cataract, right eye: Secondary | ICD-10-CM | POA: Diagnosis not present

## 2020-09-09 DIAGNOSIS — M461 Sacroiliitis, not elsewhere classified: Secondary | ICD-10-CM | POA: Diagnosis not present

## 2020-09-09 DIAGNOSIS — R262 Difficulty in walking, not elsewhere classified: Secondary | ICD-10-CM | POA: Diagnosis not present

## 2020-09-09 DIAGNOSIS — M25551 Pain in right hip: Secondary | ICD-10-CM | POA: Diagnosis not present

## 2020-09-14 DIAGNOSIS — M25551 Pain in right hip: Secondary | ICD-10-CM | POA: Diagnosis not present

## 2020-09-14 DIAGNOSIS — M461 Sacroiliitis, not elsewhere classified: Secondary | ICD-10-CM | POA: Diagnosis not present

## 2020-09-14 DIAGNOSIS — R262 Difficulty in walking, not elsewhere classified: Secondary | ICD-10-CM | POA: Diagnosis not present

## 2020-09-16 ENCOUNTER — Ambulatory Visit: Payer: PPO | Admitting: Cardiology

## 2020-09-21 DIAGNOSIS — M25551 Pain in right hip: Secondary | ICD-10-CM | POA: Diagnosis not present

## 2020-09-21 DIAGNOSIS — R262 Difficulty in walking, not elsewhere classified: Secondary | ICD-10-CM | POA: Diagnosis not present

## 2020-09-21 DIAGNOSIS — M461 Sacroiliitis, not elsewhere classified: Secondary | ICD-10-CM | POA: Diagnosis not present

## 2020-09-23 NOTE — Progress Notes (Unsigned)
Cardiology Office Note   Date:  09/24/2020   ID:  Tiffany Washington, DOB 1941-03-02, MRN [id number]  PCP:  Tiffany Hatchet, MD  Cardiologist:   No primary care provider on file. Referring:  Tiffany Hatchet, MD  Chief Complaint  Patient presents with  . Aortic Stenosis           History of Present Illness: Tiffany Washington is a 80 y.o. female who presents for evaluation of coronary calcium.  This was noted to have coronary calcium on a CT .  This was in the LM and was three vessel.  The CT was done to evaluate abdominal pain.    She did have an echo last in March of last year.  Her gradient mean was 17 there was mild AS.  This was done because of a murmur.   She had a negative Lexiscan Myoview in 2019.  Since I saw her she had back surgery.  She is done rehab and she is walking gingerly with a cane.  Her most physical activity is walking to the mailbox which is a few 100 feet.  She denies any cardiovascular symptoms. The patient denies any new symptoms such as chest discomfort, neck or arm discomfort. There has been no new shortness of breath, PND or orthopnea. There have been no reported palpitations, presyncope or syncope.  Past Medical History:  Diagnosis Date  . Aortic stenosis    Mild  . Arthritis   . GERD (gastroesophageal reflux disease)    mild  . Heart murmur   . HOH (hard of hearing)   . 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 Restless leg syndrome   . Spondylolisthesis of lumbar region   . Wears dentures    upper  . Wears glasses     Past Surgical History:  Procedure Laterality Date  . CESAREAN SECTION    . COLONOSCOPY    . COLONOSCOPY W/ BIOPSIES AND POLYPECTOMY    . KNEE SURGERY Bilateral    Bilateral  . MULTIPLE TOOTH EXTRACTIONS    . POLYPECTOMY    . UPPER GASTROINTESTINAL ENDOSCOPY    . VAGINAL HYSTERECTOMY       Current Outpatient Medications  Medication Sig Dispense Refill  . acetaminophen (TYLENOL) 325 MG tablet Take 650 mg by mouth  every 6 (six) hours as needed for moderate pain or headache.    . Ascorbic Acid (VITAMIN C) 1000 MG tablet Take 1,000 mg by mouth daily.    Marland Kitchen aspirin EC 81 MG tablet Take 81 mg by mouth at bedtime.    . B Complex Vitamins (B COMPLEX 100 PO) Take 1 tablet by mouth daily.    Marland Kitchen CRANBERRY PO Take 1 capsule by mouth daily.    . Multiple Vitamins-Minerals (MULTIVITAMIN WITH MINERALS) tablet Take 1 tablet by mouth daily.     . pramipexole (MIRAPEX) 0.25 MG tablet Take 0.25 mg by mouth 2 (two) times daily.     . prednisoLONE acetate (PRED FORTE) 1 % ophthalmic suspension Place 1 drop into the left eye 4 (four) times daily.    Marland Kitchen PROLENSA 0.07 % SOLN Place 1 drop into the right eye daily.    . Zinc 30 MG CAPS Take 30 mg by mouth daily.    Marland Kitchen moxifloxacin (VIGAMOX) 0.5 % ophthalmic solution Place 1 drop into the right eye 4 (four) times daily.    . potassium chloride (KLOR-CON) 10 MEQ tablet potassium chloride ER 10 mEq tablet,extended release  Take 1 tablet by mouth once daily for 3 days initially with furosemide     No current facility-administered medications for this visit.    Allergies:   Carbamazepine, Indocin [indomethacin], Meloxicam, Voltaren [diclofenac sodium], Gabapentin, Pamelor [nortriptyline hcl], Ropinirole, and Sulfa antibiotics    ROS:  Please see the history of present illness.   Otherwise, review of systems are positive for none.   All other systems are reviewed and negative.    PHYSICAL EXAM: VS:  BP 126/82 (BP Location: Left Arm, Patient Position: Sitting)   Pulse 70   Ht 5\' 4"  (1.626 m)   Wt 185 lb (83.9 kg)   SpO2 93%   BMI 31.76 kg/m  , BMI Body mass index is 31.76 kg/m. GENERAL:  Well appearing NECK:  No jugular venous distention, waveform within normal limits, carotid upstroke brisk and symmetric, no bruits, no thyromegaly LUNGS:  Clear to auscultation bilaterally CHEST:  Unremarkable HEART:  PMI not displaced or sustained,S1 and S2 within normal limits, no S3, no  S4, no clicks, no rubs, 3 out of 6 mid peaking systolic murmur radiating at aortic outflow tract and into the carotids no diastolic murmurs ABD:  Flat, positive bowel sounds normal in frequency in pitch, no bruits, no rebound, no guarding, no midline pulsatile mass, no hepatomegaly, no splenomegaly EXT:  2 plus pulses throughout, no edema, no cyanosis no clubbing   EKG:  EKG is  ordered today. The ekg ordered today demonstrates sinus rhythm, rate 70, axis within normal limits, intervals within normal limits, no acute ST-T wave changes.   Recent Labs: 11/04/2019: BUN 12; Creatinine, Ser 0.65; Hemoglobin 10.8; Platelets 235; Potassium 3.9; Sodium 139    Lipid Panel No results found for: CHOL, TRIG, HDL, CHOLHDL, VLDL, LDLCALC, LDLDIRECT    Wt Readings from Last 3 Encounters:  09/24/20 185 lb (83.9 kg)  11/03/19 170 lb (77.1 kg)  10/30/19 172 lb 4.8 oz (78.2 kg)      Other studies Reviewed: Additional studies/ records that were reviewed today include :  Labs   Review of the above records demonstrates: See elsewhere    ASSESSMENT AND PLAN:  CORONARY ARTERY CALCIFICATION:     She had a negative Lexiscan Myoview in 2019 and no symptoms since then.  We discussed primary risk reduction as below.   AS:  This was mild in March of last year.  However, it sounds later peaking and I would like to follow-up with an echocardiogram this year to make sure she has not had rapid advancement in her calcification and stenosis.   DYSLIPIDEMIA:   Her LDL was 145 in December but she does not want to take a statin at all we discussed the risks of this given her coronary calcium.   HTN:   Her  BP is at target  Current medicines are reviewed at length with the patient today.  The patient does not have concerns regarding medicines.  The following changes have been made:  None  Labs/ tests ordered today include:   Orders Placed This Encounter  Procedures  . EKG 12-Lead  . ECHOCARDIOGRAM COMPLETE      Disposition:   FU with me 12 months.    Signed, Minus Breeding, MD  09/24/2020 10:39 AM    [facility]

## 2020-09-24 ENCOUNTER — Encounter: Payer: Self-pay | Admitting: Cardiology

## 2020-09-24 ENCOUNTER — Other Ambulatory Visit: Payer: Self-pay

## 2020-09-24 ENCOUNTER — Ambulatory Visit: Payer: PPO | Admitting: Cardiology

## 2020-09-24 VITALS — BP 126/82 | HR 70 | Ht 64.0 in | Wt 185.0 lb

## 2020-09-24 DIAGNOSIS — I350 Nonrheumatic aortic (valve) stenosis: Secondary | ICD-10-CM

## 2020-09-24 DIAGNOSIS — I10 Essential (primary) hypertension: Secondary | ICD-10-CM | POA: Diagnosis not present

## 2020-09-24 DIAGNOSIS — R931 Abnormal findings on diagnostic imaging of heart and coronary circulation: Secondary | ICD-10-CM

## 2020-09-24 DIAGNOSIS — E785 Hyperlipidemia, unspecified: Secondary | ICD-10-CM

## 2020-09-24 NOTE — Patient Instructions (Signed)
Medication Instructions:  No changes *If you need a refill on your cardiac medications before your next appointment, please call your pharmacy*  Testing/Procedures: Your physician has requested that you have an echocardiogram in March. Echocardiography is a painless test that uses sound waves to create images of your heart. It provides your doctor with information about the size and shape of your heart and how well your heart's chambers and valves are working. This procedure takes approximately one hour. There are no restrictions for this procedure. [address]  Follow-Up: At [facility], you and your health needs are our priority.  As part of our continuing mission to provide you with exceptional heart care, we have created designated Provider Care Teams.  These Care Teams include your primary Cardiologist (physician) and Advanced Practice Providers (APPs -  Physician Assistants and Nurse Practitioners) who all work together to provide you with the care you need, when you need it.  We recommend signing up for the patient portal called "MyChart".  Sign up information is provided on this After Visit Summary.  MyChart is used to connect with patients for Virtual Visits (Telemedicine).  Patients are able to view lab/test results, encounter notes, upcoming appointments, etc.  Non-urgent messages can be sent to your provider as well.   To learn more about what you can do with MyChart, go to [email or web address].    Your next appointment:   12 month(s) You will receive a reminder letter in the mail two months in advance. If you don't receive a letter, please call our office to schedule the follow-up appointment.  The format for your next appointment:   In Person  Provider:   Minus Breeding, MD

## 2020-10-20 ENCOUNTER — Ambulatory Visit (HOSPITAL_COMMUNITY): Payer: PPO | Attending: Internal Medicine

## 2020-10-20 ENCOUNTER — Other Ambulatory Visit: Payer: Self-pay

## 2020-10-20 DIAGNOSIS — I350 Nonrheumatic aortic (valve) stenosis: Secondary | ICD-10-CM | POA: Insufficient documentation

## 2020-10-20 LAB — ECHOCARDIOGRAM COMPLETE
AR max vel: 0.98 cm2
AV Area VTI: 0.97 cm2
AV Area mean vel: 0.97 cm2
AV Mean grad: 25 mmHg
AV Peak grad: 42.8 mmHg
Ao pk vel: 3.27 m/s
Area-P 1/2: 2.13 cm2
P 1/2 time: 656 msec
S' Lateral: 2.5 cm

## 2020-10-26 ENCOUNTER — Telehealth: Payer: Self-pay | Admitting: Cardiology

## 2020-10-26 NOTE — Telephone Encounter (Signed)
-----   Message from Minus Breeding, MD sent at 10/23/2020 10:45 AM EDT ----- Her AS has progressed.  I would like to repeat the echocardiogram in six months and then see her afterward.  She does not need intervention on this right now but she needs closer follow up.   Call Ms. Renbarger with the results and send results to Velna Hatchet, MD

## 2020-10-26 NOTE — Telephone Encounter (Signed)
Called patient back after she returned call, no answer

## 2020-10-26 NOTE — Telephone Encounter (Signed)
Patient was calling back to get results.

## 2020-10-27 ENCOUNTER — Encounter: Payer: Self-pay | Admitting: Cardiology

## 2020-10-27 NOTE — Telephone Encounter (Signed)
Patient called and wants someone to call her to receive her results

## 2020-10-27 NOTE — Telephone Encounter (Signed)
Left message to call back  

## 2020-10-29 NOTE — Telephone Encounter (Signed)
This encounter was created in error - please disregard.

## 2020-11-01 ENCOUNTER — Telehealth: Payer: Self-pay | Admitting: Cardiology

## 2020-11-01 DIAGNOSIS — I350 Nonrheumatic aortic (valve) stenosis: Secondary | ICD-10-CM

## 2020-11-01 NOTE — Telephone Encounter (Signed)
Spoke with pt, echo results discussed in detail. Follow up echo scheduled for October and recall placed for follow up with dr hochrein once echo complete. Patient aware to call back to schedule follow up appointment.

## 2020-11-01 NOTE — Telephone Encounter (Signed)
Tiffany Washington is calling stating she would like her Echo results mailed to her home. She states the current address listed is correct. She also states she would like one more attempt at receiving them over the phone. She reports she will be home for an hour this morning if you can callback within that time frame. If an answer is not received she would like the results left on her VM, but still wants a paper copy sent in the mail. Please advise.

## 2020-11-02 NOTE — Telephone Encounter (Signed)
Cristopher Estimable, RN at 11/01/2020 8:42 AM  Status: Signed    Spoke with pt, echo results discussed in detail. Follow up echo scheduled for October and recall placed for follow up with dr hochrein once echo complete. Patient aware to call back to schedule follow up appointment.

## 2021-01-05 ENCOUNTER — Inpatient Hospital Stay (HOSPITAL_COMMUNITY): Payer: PPO

## 2021-01-05 ENCOUNTER — Other Ambulatory Visit: Payer: Self-pay

## 2021-01-05 ENCOUNTER — Emergency Department (HOSPITAL_COMMUNITY): Payer: PPO

## 2021-01-05 ENCOUNTER — Inpatient Hospital Stay (HOSPITAL_COMMUNITY)
Admission: EM | Admit: 2021-01-05 | Discharge: 2021-01-07 | DRG: 064 | Disposition: A | Discharge status: Home / Self Care | Payer: PPO | Attending: Neurology | Admitting: Neurology

## 2021-01-05 ENCOUNTER — Encounter (HOSPITAL_COMMUNITY): Payer: Self-pay

## 2021-01-05 DIAGNOSIS — G2581 Restless legs syndrome: Secondary | ICD-10-CM | POA: Diagnosis not present

## 2021-01-05 DIAGNOSIS — G936 Cerebral edema: Secondary | ICD-10-CM | POA: Diagnosis present

## 2021-01-05 DIAGNOSIS — M4316 Spondylolisthesis, lumbar region: Secondary | ICD-10-CM | POA: Diagnosis not present

## 2021-01-05 DIAGNOSIS — H9190 Unspecified hearing loss, unspecified ear: Secondary | ICD-10-CM | POA: Diagnosis not present

## 2021-01-05 DIAGNOSIS — I618 Other nontraumatic intracerebral hemorrhage: Secondary | ICD-10-CM | POA: Diagnosis not present

## 2021-01-05 DIAGNOSIS — Z20822 Contact with and (suspected) exposure to covid-19: Secondary | ICD-10-CM | POA: Diagnosis present

## 2021-01-05 DIAGNOSIS — I352 Nonrheumatic aortic (valve) stenosis with insufficiency: Secondary | ICD-10-CM | POA: Diagnosis not present

## 2021-01-05 DIAGNOSIS — K219 Gastro-esophageal reflux disease without esophagitis: Secondary | ICD-10-CM | POA: Diagnosis not present

## 2021-01-05 DIAGNOSIS — R29702 NIHSS score 2: Secondary | ICD-10-CM | POA: Diagnosis present

## 2021-01-05 DIAGNOSIS — I619 Nontraumatic intracerebral hemorrhage, unspecified: Secondary | ICD-10-CM | POA: Diagnosis present

## 2021-01-05 DIAGNOSIS — I6782 Cerebral ischemia: Secondary | ICD-10-CM | POA: Diagnosis not present

## 2021-01-05 DIAGNOSIS — I10 Essential (primary) hypertension: Secondary | ICD-10-CM | POA: Diagnosis present

## 2021-01-05 DIAGNOSIS — I161 Hypertensive emergency: Secondary | ICD-10-CM | POA: Diagnosis not present

## 2021-01-05 DIAGNOSIS — Z888 Allergy status to other drugs, medicaments and biological substances status: Secondary | ICD-10-CM

## 2021-01-05 DIAGNOSIS — G8191 Hemiplegia, unspecified affecting right dominant side: Secondary | ICD-10-CM | POA: Diagnosis present

## 2021-01-05 DIAGNOSIS — Z85820 Personal history of malignant melanoma of skin: Secondary | ICD-10-CM

## 2021-01-05 DIAGNOSIS — M1990 Unspecified osteoarthritis, unspecified site: Secondary | ICD-10-CM | POA: Diagnosis present

## 2021-01-05 DIAGNOSIS — Z882 Allergy status to sulfonamides status: Secondary | ICD-10-CM | POA: Diagnosis not present

## 2021-01-05 DIAGNOSIS — Z8249 Family history of ischemic heart disease and other diseases of the circulatory system: Secondary | ICD-10-CM | POA: Diagnosis not present

## 2021-01-05 DIAGNOSIS — Z833 Family history of diabetes mellitus: Secondary | ICD-10-CM

## 2021-01-05 DIAGNOSIS — I672 Cerebral atherosclerosis: Secondary | ICD-10-CM | POA: Diagnosis not present

## 2021-01-05 DIAGNOSIS — R200 Anesthesia of skin: Secondary | ICD-10-CM | POA: Diagnosis not present

## 2021-01-05 DIAGNOSIS — E119 Type 2 diabetes mellitus without complications: Secondary | ICD-10-CM | POA: Diagnosis present

## 2021-01-05 DIAGNOSIS — E785 Hyperlipidemia, unspecified: Secondary | ICD-10-CM | POA: Diagnosis not present

## 2021-01-05 DIAGNOSIS — I610 Nontraumatic intracerebral hemorrhage in hemisphere, subcortical: Secondary | ICD-10-CM | POA: Diagnosis not present

## 2021-01-05 DIAGNOSIS — R29818 Other symptoms and signs involving the nervous system: Secondary | ICD-10-CM | POA: Diagnosis not present

## 2021-01-05 DIAGNOSIS — J323 Chronic sphenoidal sinusitis: Secondary | ICD-10-CM | POA: Diagnosis not present

## 2021-01-05 LAB — URINALYSIS, ROUTINE W REFLEX MICROSCOPIC
Bacteria, UA: NONE SEEN
Bilirubin Urine: NEGATIVE
Glucose, UA: NEGATIVE mg/dL
Hgb urine dipstick: NEGATIVE
Ketones, ur: NEGATIVE mg/dL
Nitrite: NEGATIVE
Protein, ur: NEGATIVE mg/dL
Specific Gravity, Urine: 1.012 (ref 1.005–1.030)
pH: 6 (ref 5.0–8.0)

## 2021-01-05 LAB — COMPREHENSIVE METABOLIC PANEL
ALT: 33 U/L (ref 0–44)
AST: 32 U/L (ref 15–41)
Albumin: 3.9 g/dL (ref 3.5–5.0)
Alkaline Phosphatase: 73 U/L (ref 38–126)
Anion gap: 10 (ref 5–15)
BUN: 13 mg/dL (ref 8–23)
CO2: 26 mmol/L (ref 22–32)
Calcium: 9.6 mg/dL (ref 8.9–10.3)
Chloride: 102 mmol/L (ref 98–111)
Creatinine, Ser: 0.7 mg/dL (ref 0.44–1.00)
GFR, Estimated: >60 mL/min (ref >60)
Glucose, Bld: 94 mg/dL (ref 70–99)
Potassium: 4 mmol/L (ref 3.5–5.1)
Sodium: 138 mmol/L (ref 135–145)
Total Bilirubin: 1 mg/dL (ref 0.3–1.2)
Total Protein: 7.1 g/dL (ref 6.5–8.1)

## 2021-01-05 LAB — DIFFERENTIAL
Abs Immature Granulocytes: 0.01 10*3/uL (ref 0.00–0.07)
Basophils Absolute: 0.1 10*3/uL (ref 0.0–0.1)
Basophils Relative: 1 %
Eosinophils Absolute: 0.2 10*3/uL (ref 0.0–0.5)
Eosinophils Relative: 4 %
Immature Granulocytes: 0 %
Lymphocytes Relative: 41 %
Lymphs Abs: 2.7 10*3/uL (ref 0.7–4.0)
Monocytes Absolute: 0.6 10*3/uL (ref 0.1–1.0)
Monocytes Relative: 9 %
Neutro Abs: 3 10*3/uL (ref 1.7–7.7)
Neutrophils Relative %: 45 %

## 2021-01-05 LAB — CBC
HCT: 41.6 % (ref 36.0–46.0)
Hemoglobin: 13.9 g/dL (ref 12.0–15.0)
MCH: 32 pg (ref 26.0–34.0)
MCHC: 33.4 g/dL (ref 30.0–36.0)
MCV: 95.9 fL (ref 80.0–100.0)
Platelets: 248 10*3/uL (ref 150–400)
RBC: 4.34 MIL/uL (ref 3.87–5.11)
RDW: 12.4 % (ref 11.5–15.5)
WBC: 6.7 10*3/uL (ref 4.0–10.5)
nRBC: 0 % (ref 0.0–0.2)

## 2021-01-05 LAB — MRSA PCR SCREENING: MRSA by PCR: NEGATIVE

## 2021-01-05 LAB — APTT: aPTT: 32 seconds (ref 24–36)

## 2021-01-05 LAB — RESP PANEL BY RT-PCR (FLU A&B, COVID) ARPGX2
Influenza A by PCR: NEGATIVE
Influenza B by PCR: NEGATIVE
SARS Coronavirus 2 by RT PCR: NEGATIVE

## 2021-01-05 LAB — PROTIME-INR
INR: 1 (ref 0.8–1.2)
Prothrombin Time: 13.2 seconds (ref 11.4–15.2)

## 2021-01-05 LAB — ETHANOL: Alcohol, Ethyl (B): <10 mg/dL (ref <10)

## 2021-01-05 MED ORDER — STROKE: EARLY STAGES OF RECOVERY BOOK: Freq: Once | Status: DC

## 2021-01-05 MED ORDER — GADOBUTROL 1 MMOL/ML IV SOLN
7.5000 mL | Freq: Once | INTRAVENOUS | Status: AC | PRN
  Administered 2021-01-05: 7.5 mL via INTRAVENOUS

## 2021-01-05 MED ORDER — PANTOPRAZOLE SODIUM 40 MG IV SOLR
40.0000 mg | Freq: Every day | INTRAVENOUS | Status: DC
  Administered 2021-01-05 – 2021-01-06 (×2): 40 mg via INTRAVENOUS
  Filled 2021-01-05 (×2): qty 40

## 2021-01-05 MED ORDER — CLEVIDIPINE BUTYRATE 0.5 MG/ML IV EMUL
0.0000 mg/h | INTRAVENOUS | Status: DC
  Administered 2021-01-05: 1 mg/h via INTRAVENOUS
  Administered 2021-01-05: 4 mg/h via INTRAVENOUS
  Filled 2021-01-05: qty 50

## 2021-01-05 MED ORDER — SENNOSIDES-DOCUSATE SODIUM 8.6-50 MG PO TABS
1.0000 | ORAL_TABLET | Freq: Two times a day (BID) | ORAL | Status: DC
  Administered 2021-01-05 – 2021-01-07 (×3): 1 via ORAL
  Filled 2021-01-05 (×4): qty 1

## 2021-01-05 MED ORDER — STROKE: EARLY STAGES OF RECOVERY BOOK
Freq: Once | Status: AC
  Filled 2021-01-05: qty 1

## 2021-01-05 NOTE — ED Notes (Signed)
Patient sent to MRI

## 2021-01-05 NOTE — ED Provider Notes (Signed)
Emergency Medicine Provider Triage Evaluation Note  KAHLIA Washington , a 81 y.o. female  was evaluated in triage.  Pt complains of stroke like symptoms. Pt noted right sided weakness upon waking this am at 630 am. She had no sxs when going to bed last night. Denies vision changes  Review of Systems  Positive: Right sided weakness Negative: Vision changes, headache  Physical Exam  BP (!) 173/91 (BP Location: Left Arm)   Pulse 68   Resp 15   SpO2 100%  Gen:   Awake, no distress   Resp:  Normal effort  MSK:   Moves extremities without difficulty  Other:  Right eye ptosis, 4/5 strength to the rue with + pronator drift. ble strength appears intact  Medical Decision Making  Medically screening exam initiated at 12:58 PM.  Appropriate orders placed.  Tiffany Washington was informed that the remainder of the evaluation will be completed by another provider, this initial triage assessment does not replace that evaluation, and the importance of remaining in the ED until their evaluation is complete.  Stroke w/u initiated. Pt out of window for tpa and is van negative.   Rodney Booze, PA-C 01/05/21 1300    Lajean Saver, MD 01/07/21 [phone]

## 2021-01-05 NOTE — ED Provider Notes (Signed)
[facility] EMERGENCY DEPARTMENT Provider Note   CSN: [id number] Arrival date & time: 01/05/21  1242     History Chief Complaint  Patient presents with  . Extremity Weakness  . Numbness    Tiffany Washington is a 80 y.o. female.  Tiffany Washington is a 80 y.o. female with a history of GERD, hypertension, dyslipidemia, aortic stenosis, who presents to the emergency department for evaluation of weakness and numbness.  Patient reports that when she woke up this morning around 6:30 AM she noticed numbness and decreased sensation to her right upper and lower extremity.  She reports that yesterday she just did not feel right but was not sure what was wrong, but did not have numbness when she went to bed.  She does report feeling weak yesterday, and in particular noticed that she was dropping things with her right hand.  She denies any changes in vision, no changes in speech.  Has not had any headache.  Denies any nausea or vomiting.  No seizure-like activity, lightheadedness or syncope.  The history is provided by the patient.       Past Medical History:  Diagnosis Date  . Aortic stenosis    Mild  . Arthritis   . GERD (gastroesophageal reflux disease)    mild  . Heart murmur   . HOH (hard of hearing)   . 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 Restless leg syndrome   . Spondylolisthesis of lumbar region   . Wears dentures    upper  . Wears glasses     Patient Active Problem List   Diagnosis Date Noted  . Spondylolisthesis of lumbar region 11/03/2019  . Educated about COVID-19 virus infection 07/17/2019  . Elevated coronary artery calcium score 07/11/2018  . Medication management 07/11/2018  . Nonrheumatic aortic valve stenosis 07/11/2018  . Dyslipidemia 07/11/2018  . Small fiber neuropathy 06/03/2013    Past Surgical History:  Procedure Laterality Date  . CESAREAN SECTION    . COLONOSCOPY    . COLONOSCOPY W/ BIOPSIES AND POLYPECTOMY    .  KNEE SURGERY Bilateral    Bilateral  . MULTIPLE TOOTH EXTRACTIONS    . POLYPECTOMY    . UPPER GASTROINTESTINAL ENDOSCOPY    . VAGINAL HYSTERECTOMY       OB History   No obstetric history on file.     Family History  Problem Relation Age of Onset  . Hypertension Mother   . Congestive Heart Failure Mother        Died, 9  . Heart failure Mother   . Pulmonary embolism Father        Died, 81  . CAD Brother 36       CABG  . Diabetes Brother   . Diabetes Sister   . Gout Son     Social History   Tobacco Use  . Smoking status: Never Smoker  . Smokeless tobacco: Never Used  Vaping Use  . Vaping Use: Never used  Substance Use Topics  . Alcohol use: No  . Drug use: No    Home Medications Prior to Admission medications   Medication Sig Start Date End Date Taking? Authorizing Provider  acetaminophen (TYLENOL) 325 MG tablet Take 650 mg by mouth every 6 (six) hours as needed for moderate pain or headache.    [provider]  Ascorbic Acid (VITAMIN C) 1000 MG tablet Take 1,000 mg by mouth daily.    [provider]  aspirin EC 81 MG tablet Take 81 mg by mouth at bedtime.    [provider]  B Complex Vitamins (B COMPLEX 100 PO) Take 1 tablet by mouth daily.    [provider]  CRANBERRY PO Take 1 capsule by mouth daily.    [provider]  moxifloxacin (VIGAMOX) 0.5 % ophthalmic solution Place 1 drop into the right eye 4 (four) times daily. 09/01/20   [provider]  Multiple Vitamins-Minerals (MULTIVITAMIN WITH MINERALS) tablet Take 1 tablet by mouth daily.     [provider]  potassium chloride (KLOR-CON) 10 MEQ tablet potassium chloride ER 10 mEq tablet,extended release  Take 1 tablet by mouth once daily for 3 days initially with furosemide    [provider]  pramipexole (MIRAPEX) 0.25 MG tablet Take 0.25 mg by mouth 2 (two) times daily.     [provider]  prednisoLONE acetate (PRED FORTE) 1 %  ophthalmic suspension Place 1 drop into the left eye 4 (four) times daily. 08/27/20   [provider]  PROLENSA 0.07 % SOLN Place 1 drop into the right eye daily. 09/01/20   [provider]  Zinc 30 MG CAPS Take 30 mg by mouth daily.    [provider]    Allergies    Carbamazepine, Indocin [indomethacin], Meloxicam, Voltaren [diclofenac sodium], Gabapentin, Pamelor [nortriptyline hcl], Ropinirole, and Sulfa antibiotics  Review of Systems   Review of Systems  Constitutional: Negative for chills and fever.  HENT: Negative.   Eyes: Negative for visual disturbance.  Respiratory: Negative for cough and shortness of breath.   Cardiovascular: Negative for chest pain.  Gastrointestinal: Negative for abdominal pain, nausea and vomiting.  Musculoskeletal: Negative for arthralgias and myalgias.  Skin: Negative for color change and rash.  Neurological: Positive for weakness and numbness. Negative for dizziness, seizures, syncope, facial asymmetry, speech difficulty, light-headedness and headaches.  All other systems reviewed and are negative.   Physical Exam Updated Vital Signs BP (!) 173/91 (BP Location: Left Arm)   Pulse 68   Temp 98.3 F (36.8 C) (Oral)   Resp 15   SpO2 100%   Physical Exam Vitals and nursing note reviewed.  Constitutional:      General: She is not in acute distress.    Appearance: She is well-developed. She is not diaphoretic.     Comments: Alert and in no acute distress  HENT:     Head: Normocephalic and atraumatic.     Mouth/Throat:     Mouth: Mucous membranes are moist.     Pharynx: Oropharynx is clear.  Eyes:     General:        Right eye: No discharge.        Left eye: No discharge.     Extraocular Movements: Extraocular movements intact.     Pupils: Pupils are equal, round, and reactive to light.  Cardiovascular:     Rate and Rhythm: Normal rate and regular rhythm.     Pulses: Normal pulses.     Heart sounds: Normal heart  sounds. No murmur heard. No friction rub. No gallop.   Pulmonary:     Effort: Pulmonary effort is normal. No respiratory distress.     Breath sounds: Normal breath sounds. No wheezing or rales.     Comments: Respirations equal and unlabored, patient able to speak in full sentences, lungs clear to auscultation bilaterally  Abdominal:     General: Bowel sounds are normal. There is no distension.  Palpations: Abdomen is soft. There is no mass.     Tenderness: There is no abdominal tenderness. There is no guarding.     Comments: Abdomen soft, nondistended, nontender to palpation in all quadrants without guarding or peritoneal signs   Musculoskeletal:        General: No deformity.     Cervical back: Neck supple.     Right lower leg: No edema.     Left lower leg: No edema.  Skin:    General: Skin is warm and dry.     Capillary Refill: Capillary refill takes less than 2 seconds.  Neurological:     Mental Status: She is alert.     Coordination: Coordination normal.     Comments: Speech is clear, able to follow commands, patient is alert, some responses to questions slightly slowed CN III-XII intact 4/5 strength in right lower extremity, 5/5 strength in the left 5/5/ strength in bilateral upper extremities Sensation decreased in right upper and lower extremity when compared to left Moves extremities without ataxia, coordination intact No pronator drift   Psychiatric:        Mood and Affect: Mood normal.        Behavior: Behavior normal.     ED Results / Procedures / Treatments   Labs (all labs ordered are listed, but only abnormal results are displayed) Labs Reviewed  URINALYSIS, ROUTINE W REFLEX MICROSCOPIC - Abnormal; Notable for the following components:      Result Value   Leukocytes,Ua TRACE (*)    All other components within normal limits  RESP PANEL BY RT-PCR (FLU A&B, COVID) ARPGX2  ETHANOL  PROTIME-INR  APTT  CBC  DIFFERENTIAL  COMPREHENSIVE METABOLIC PANEL   RAPID URINE DRUG SCREEN, HOSP PERFORMED    EKG None  Radiology CT HEAD WO CONTRAST  Result Date: 01/05/2021 CLINICAL DATA:  Neurologic deficit.  Acute stroke suspected. EXAM: CT HEAD WITHOUT CONTRAST TECHNIQUE: Contiguous axial images were obtained from the base of the skull through the vertex without intravenous contrast. COMPARISON:  None. FINDINGS: Brain: There is a left-sided acute intracerebral hematoma with epicenter in the basal ganglia/external capsule. This measures 1.7 x 1.6 by 2.1 cm. There is mild peripheral area of low attenuation reflecting edema. No significant midline shift identified. No signs of intraventricular hemorrhage, and there is no mass effect upon the ventricular system. There is mild diffuse low-attenuation within the subcortical and periventricular white matter compatible with chronic microvascular disease. Vascular: Atherosclerotic calcifications noted within the carotid siphon and the left vertebral artery. Skull: Normal. Negative for fracture or focal lesion. Sinuses/Orbits: No acute finding. Other: None. IMPRESSION: 1. Examination is positive for acute left-sided intracerebral hematoma centered around the left basal ganglia. Mild surrounding low attenuation edema noted. No significant midline shift. 2. Chronic small vessel ischemic disease and brain atrophy. 3. Critical Value/emergent results were called by telephone at the time of interpretation on 01/05/2021 at 2:00 pm to provider Dr. Pattricia Boss, who verbally acknowledged these results. Electronically Signed   By: Kerby Moors M.D.   On: 01/05/2021 14:01    Procedures .Critical Care Performed by: Jacqlyn Larsen, PA-C Authorized by: Jacqlyn Larsen, PA-C   Critical care provider statement:    Critical care time (minutes):  45   Critical care was necessary to treat or prevent imminent or life-threatening deterioration of the following conditions:  CNS failure or compromise   Critical care was time spent  personally by me on the following activities:  Discussions with consultants,  evaluation of patient's response to treatment, examination of patient, ordering and performing treatments and interventions, ordering and review of laboratory studies, ordering and review of radiographic studies, pulse oximetry, re-evaluation of patient's condition, obtaining history from patient or surrogate and review of old charts     Medications Ordered in ED Medications  clevidipine (CLEVIPREX) infusion 0.5 mg/mL (1 mg/hr Intravenous New Bag/Given 01/05/21 1404)   stroke: mapping our early stages of recovery book (has no administration in time range)    ED Course  I have reviewed the triage vital signs and the nursing notes.  Pertinent labs & imaging results that were available during my care of the patient were reviewed by me and considered in my medical decision making (see chart for details).    MDM Rules/Calculators/A&P                         80 year old female arrives with right-sided numbness in the upper and lower extremity and some right lower extremity weakness.  Numbness began when she woke up at 6:30 but she reports not feeling well yesterday either.  She is outside of the window for code stroke, VAN negative on exam.  CT and lab work initiated from triage provider.  CT reviewed by myself with evidence of left-sided intraparenchymal hemorrhage.  Despite head bleed noted on exam patient is alert and well-appearing, no headache or nausea but does have noted weakness in the right lower leg and decreased sensation on the right side.  Radiology called and confirmed left-sided intracerebral hematoma centered around the left basal ganglia with mild surrounding edema, no significant shift.  Fortunately patient is not on anticoagulation, does take daily 81 mg aspirin.  Lab work is unremarkable, COVID screening is negative.  Blood pressure in the 170s-180s on arrival, started on Cleviprex drip for blood  pressure control.  Discussed with Dr. Zada Finders with neurosurgery, no surgical intervention at this time, defer to neurology.  Case discussed with Dr. Su Monks with neurology, she will see and admit patient to neuro ICU.  Final Clinical Impression(s) / ED Diagnoses Final diagnoses:  Intraparenchymal hemorrhage of brain [facility])    Rx / DC Orders ED Discharge Orders    None       Jacqlyn Larsen, [place] 01/05/21 1531    Pattricia Boss, MD 01/05/21 1534

## 2021-01-05 NOTE — ED Triage Notes (Signed)
Patient complains of right hand and foot numbness since am. States that she hasnt felt well x 2 days, describes as malaise. Alert and oriented, no acute neuro deficits

## 2021-01-05 NOTE — Consult Note (Signed)
NEUROLOGY CONSULTATION NOTE   Date of service: January 05, 2021 Patient Name: Tiffany Washington MRN:  [id number] DOB:  May 31, 1941 Reason for consult: R arm and leg numbness since this AM, L BG ICH on CT _ _ _   _ __   _ __ _ _  __ __   _ __   __ _  History of Present Illness   Tiffany Washington is a 80 y.o. 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 who presents to ED with R arm and leg numbness since this AM. She had mild nausea this AM and has overall been feeling tired for a few days. Denies focal weakness, on exam only focal weakness is 4+/5 R grip no drift in any extremity.  BP up to 185/65 in triage now down to SBP 134 on clevidipine. No prior known hx HTN. No recent trauma. Not on AC. On daily ASA 81mg  daily only.   ROS   10 point review of systems was performed and was negative except as described in HPI.  Past History   Past Medical History:  Diagnosis Date  . Aortic stenosis    Mild  . Arthritis   . GERD (gastroesophageal reflux disease)    mild  . Heart murmur   . HOH (hard of hearing)   . 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 Restless leg syndrome   . Spondylolisthesis of lumbar region   . Wears dentures    upper  . Wears glasses    Past Surgical History:  Procedure Laterality Date  . CESAREAN SECTION    . COLONOSCOPY    . COLONOSCOPY W/ BIOPSIES AND POLYPECTOMY    . KNEE SURGERY Bilateral    Bilateral  . MULTIPLE TOOTH EXTRACTIONS    . POLYPECTOMY    . UPPER GASTROINTESTINAL ENDOSCOPY    . VAGINAL HYSTERECTOMY     Family History  Problem Relation Age of Onset  . Hypertension Mother   . Congestive Heart Failure Mother        Died, 41  . Heart failure Mother   . Pulmonary embolism Father         Died, 81  . CAD Brother 36       CABG  . Diabetes Brother   . Diabetes Sister   . Gout Son    Social History   Socioeconomic History  . Marital status: Married    Spouse name: Not on file  . Number of children: Not on file  . Years of education: Not on file  . Highest education level: Not on file  Occupational History  . Not on file  Tobacco Use  . Smoking status: Never Smoker  . Smokeless tobacco: Never Used  Vaping Use  . Vaping Use: Never used  Substance and Sexual Activity  . Alcohol use: No  . Drug use: No  . Sexual activity: Not on file  Other Topics Concern  . 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   She is married for 52 years and they have two children.   Social Determinants of Health   Financial Resource Strain: Not on file  Food Insecurity: Not  on file  Transportation Needs: Not on file  Physical Activity: Not on file  Stress: Not on file  Social Connections: Not on file   Allergies  Allergen Reactions  . Carbamazepine Other (See Comments)    Doesn't remember   . Indocin [Indomethacin] Other (See Comments)    Doesn't remember   . Meloxicam     Unknown reaction  . Voltaren [Diclofenac Sodium]     Unknown reaction  . Gabapentin Other (See Comments)    Cant sleep   . Pamelor [Nortriptyline Hcl] Other (See Comments)    insomnia  . Ropinirole     Speed the legs up.  . Sulfa Antibiotics Diarrhea and Nausea And Vomiting    Medications   (Not in a hospital admission)    Vitals   Vitals:   01/05/21 1400 01/05/21 1403 01/05/21 1406 01/05/21 1409  BP: (!) 175/91 (!) 185/65 (!) 142/65 (!) 163/85  Pulse: 71 71 75 81  Resp: 19 (!) 22 20 (!) 24  Temp:      TempSrc:      SpO2: 100% 97% 100% 100%     There is no height or weight on file to calculate BMI.  Physical Exam   Physical Exam Gen: A&O x4, NAD HEENT: Atraumatic, normocephalic;mucous membranes moist;  oropharynx clear, tongue without atrophy or fasciculations. Neck: Supple, trachea midline. Resp: CTAB, no w/r/r CV: RRR, no m/g/r; nml S1 and S2. 2+ symmetric peripheral pulses. Abd: soft/NT/ND; nabs x 4 quad Extrem: Nml bulk; no cyanosis, clubbing, or edema.  Neuro: *MS: A&O x4. Follows multi-step commands.  *Speech: fluid, nondysarthric, able to name and repeat *CN:    I: Deferred   II,III: PERRLA, VFF by confrontation, optic discs sharp   III,IV,VI: EOMI w/o nystagmus, no ptosis   V: Sensation intact from V1 to V3 to LT   VII: Eyelid closure was full.  Smile symmetric.   VIII: Hearing intact to voice   IX,X: Voice normal, palate elevates symmetrically    XI: SCM/trap 5/5 bilat   XII: Tongue protrudes midline, no atrophy or fasciculations  *Motor:   Normal bulk.  No tremor, rigidity or bradykinesia. No pronator drift.    Strength: Dlt Bic Tri WrE WrF FgS Gr HF KnF KnE PlF DoF    Left 5 5 5 5 5 5 5 5 5 5 5 5     Right 5 5 5 5 5 5  4+ 5 5 5 5 5     *Sensory: Minimal impairment to LT R arm only. Symmetric. Propioception intact bilat.  Extinction to DSS RUE only.  *Coordination:  Finger-to-nose, heel-to-shin, rapid alternating motions were intact. *Reflexes:  2+ and symmetric throughout without clonus; toes down-going bilat *Gait: deferred  NIHSS = 2 for sensory impairment and extinction  Premorbid mRS = 1  ICH score = 0    Labs   CBC:  Recent Labs  Lab 01/05/21 1310  WBC 6.7  NEUTROABS 3.0  HGB 13.9  HCT 41.6  MCV 95.9  PLT 245    Basic Metabolic Panel:  Lab Results  Component Value Date   NA 138 01/05/2021   K 4.0 01/05/2021   CO2 26 01/05/2021   GLUCOSE 94 01/05/2021   BUN 13 01/05/2021   CREATININE 0.70 01/05/2021   CALCIUM 9.6 01/05/2021   GFRNONAA >60 01/05/2021   GFRAA >60 11/04/2019   Lipid Panel: No results found for: LDLCALC HgbA1c: No results found for: HGBA1C Urine Drug Screen: No results found for: LABOPIA, COCAINSCRNUR, LABBENZ, AMPHETMU,  THCU, LABBARB  Alcohol Level     Component Value Date/Time   ETH <10 01/05/2021 1256    Impression   80 yo woman p/w R arm and leg numbness and found to have L BG IPH  Recommendations   - Admit to ICU under Dr. Quinn Axe - Clevidipine for goal SBP <160 - SCDs for DVT prophylaxis - Hold antiplatelet - Head CT q 6 hrs assess stability of ICH - HOB elevated 30 degrees - No indication to consult NSU at this time   Stroke team will assume care in AM.    This patient is critically ill and at significant risk of neurological worsening, death and care requires constant monitoring of vital signs, hemodynamics,respiratory and cardiac monitoring, neurological assessment, discussion with family, other specialists and medical decision making of high complexity. I spent 60 minutes of neurocritical care time  in the care of  this patient. This was time spent independent of any time provided by nurse practitioner or PA.   Thank you for the opportunity to take part in the care of this patient. If you have any further questions, please contact the neurology consultation attending.  Signed,  Su Monks, MD [facility] [phone] If 7pm- 7am, please page neurology on call as listed in [place].

## 2021-01-05 NOTE — H&P (Addendum)
NEUROLOGY H&P   Date of service: January 05, 2021 Patient Name: Tiffany Washington MRN:  [id number] DOB:  01/23/1941 Reason for consult: R arm and leg numbness since this AM, L BG ICH on CT _ _ _   _ __   _ __ _ _  __ __   _ __   __ _  History of Present Illness   Tiffany Washington is a 80 y.o. 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 who presents to ED with R arm and leg numbness since this AM. She had mild nausea this AM and has overall been feeling tired for a few days. Denies focal weakness, on exam only focal weakness is 4+/5 R grip no drift in any extremity.  BP up to 185/65 in triage now down to SBP 134 on clevidipine. No prior known hx HTN. No recent trauma. Not on AC. On daily ASA 81mg  daily only.   ROS   10 point review of systems was performed and was negative except as described in HPI.  Past History   Past Medical History:  Diagnosis Date  . Aortic stenosis    Mild  . Arthritis   . GERD (gastroesophageal reflux disease)    mild  . Heart murmur   . HOH (hard of hearing)   . 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 Restless leg syndrome   . Spondylolisthesis of lumbar region   . Wears dentures    upper  . Wears glasses    Past Surgical History:  Procedure Laterality Date  . CESAREAN SECTION    . COLONOSCOPY    . COLONOSCOPY W/ BIOPSIES AND POLYPECTOMY    . KNEE SURGERY Bilateral    Bilateral  . MULTIPLE TOOTH EXTRACTIONS    . POLYPECTOMY    . UPPER GASTROINTESTINAL ENDOSCOPY    . VAGINAL HYSTERECTOMY     Family History  Problem Relation Age of Onset  . Hypertension Mother   . Congestive Heart Failure Mother        Died, 64  . Heart failure Mother   . Pulmonary embolism Father        Died,  81  . CAD Brother 61       CABG  . Diabetes Brother   . Diabetes Sister   . Gout Son    Social History   Socioeconomic History  . Marital status: Married    Spouse name: Not on file  . Number of children: Not on file  . Years of education: Not on file  . Highest education level: Not on file  Occupational History  . Not on file  Tobacco Use  . Smoking status: Never Smoker  . Smokeless tobacco: Never Used  Vaping Use  . Vaping Use: Never used  Substance and Sexual Activity  . Alcohol use: No  . Drug use: No  . Sexual activity: Not on file  Other Topics Concern  . 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   She is married for 52 years and they have two children.   Social Determinants of Health   Financial Resource Strain: Not on file  Food Insecurity: Not on  file  Transportation Needs: Not on file  Physical Activity: Not on file  Stress: Not on file  Social Connections: Not on file   Allergies  Allergen Reactions  . Carbamazepine Other (See Comments)    Doesn't remember   . Indocin [Indomethacin] Other (See Comments)    Doesn't remember   . Meloxicam     Unknown reaction  . Voltaren [Diclofenac Sodium]     Unknown reaction  . Gabapentin Other (See Comments)    Cant sleep   . Pamelor [Nortriptyline Hcl] Other (See Comments)    insomnia  . Ropinirole     Speed the legs up.  . Sulfa Antibiotics Diarrhea and Nausea And Vomiting    Medications   (Not in a hospital admission)    Vitals   Vitals:   01/05/21 1400 01/05/21 1403 01/05/21 1406 01/05/21 1409  BP: (!) 175/91 (!) 185/65 (!) 142/65 (!) 163/85  Pulse: 71 71 75 81  Resp: 19 (!) 22 20 (!) 24  Temp:      TempSrc:      SpO2: 100% 97% 100% 100%     There is no height or Washington on file to calculate BMI.  Physical Exam   Physical Exam Gen: A&O x4, NAD HEENT: Atraumatic, normocephalic;mucous membranes moist; oropharynx clear,  tongue without atrophy or fasciculations. Neck: Supple, trachea midline. Resp: CTAB, no w/r/r CV: RRR, no m/g/r; nml S1 and S2. 2+ symmetric peripheral pulses. Abd: soft/NT/ND; nabs x 4 quad Extrem: Nml bulk; no cyanosis, clubbing, or edema.  Neuro: *MS: A&O x4. Follows multi-step commands.  *Speech: fluid, nondysarthric, able to name and repeat *CN:    I: Deferred   II,III: PERRLA, VFF by confrontation, optic discs sharp   III,IV,VI: EOMI w/o nystagmus, no ptosis   V: Sensation intact from V1 to V3 to LT   VII: Eyelid closure was full.  Smile symmetric.   VIII: Hearing intact to voice   IX,X: Voice normal, palate elevates symmetrically    XI: SCM/trap 5/5 bilat   XII: Tongue protrudes midline, no atrophy or fasciculations  *Motor:   Normal bulk.  No tremor, rigidity or bradykinesia. No pronator drift.    Strength: Dlt Bic Tri WrE WrF FgS Gr HF KnF KnE PlF DoF    Left 5 5 5 5 5 5 5 5 5 5 5 5     Right 5 5 5 5 5 5  4+ 5 5 5 5 5     *Sensory: Minimal impairment to LT R arm only. Symmetric. Propioception intact bilat.  Extinction to DSS RUE only.  *Coordination:  Finger-to-nose, heel-to-shin, rapid alternating motions were intact. *Reflexes:  2+ and symmetric throughout without clonus; toes down-going bilat *Gait: deferred  NIHSS = 2 for sensory impairment and extinction  Premorbid mRS = 1  ICH score = 0    Labs   CBC:  Recent Labs  Lab 01/05/21 1310  WBC 6.7  NEUTROABS 3.0  HGB 13.9  HCT 41.6  MCV 95.9  PLT 938    Basic Metabolic Panel:  Lab Results  Component Value Date   NA 138 01/05/2021   K 4.0 01/05/2021   CO2 26 01/05/2021   GLUCOSE 94 01/05/2021   BUN 13 01/05/2021   CREATININE 0.70 01/05/2021   CALCIUM 9.6 01/05/2021   GFRNONAA >60 01/05/2021   GFRAA >60 11/04/2019   Lipid Panel: No results found for: LDLCALC HgbA1c: No results found for: HGBA1C Urine Drug Screen: No results found for: LABOPIA, [place], [place], AMPHETMU, THCU, LABBARB  Alcohol Level     Component Value Date/Time   ETH <10 01/05/2021 1256    Impression   80 yo woman p/w R arm and leg numbness and found to have L BG IPH  Recommendations   - Admit to ICU under Dr. Quinn Axe - Clevidipine for goal SBP <160 - SCDs for DVT prophylaxis - Hold antiplatelet - Head CT q 6 hrs assess stability of ICH - HOB elevated 30 degrees - No indication to consult NSU at this time   Stroke team will assume care in AM.    This patient is critically ill and at significant risk of neurological worsening, death and care requires constant monitoring of vital signs, hemodynamics,respiratory and cardiac monitoring, neurological assessment, discussion with family, other specialists and medical decision making of high complexity. I spent 60 minutes of neurocritical care time  in the care of  this patient. This was time spent independent of any time provided by nurse practitioner or PA.   Thank you for the opportunity to take part in the care of this patient. If you have any further questions, please contact the neurology consultation attending.  Signed,  Su Monks, MD [facility] [phone] If 7pm- 7am, please page neurology on call as listed in [place].

## 2021-01-06 ENCOUNTER — Inpatient Hospital Stay (HOSPITAL_COMMUNITY): Payer: PPO

## 2021-01-06 ENCOUNTER — Encounter (HOSPITAL_COMMUNITY): Payer: PPO

## 2021-01-06 ENCOUNTER — Encounter (HOSPITAL_COMMUNITY): Payer: Self-pay | Admitting: Neurology

## 2021-01-06 DIAGNOSIS — I610 Nontraumatic intracerebral hemorrhage in hemisphere, subcortical: Principal | ICD-10-CM

## 2021-01-06 MED ORDER — LABETALOL HCL 5 MG/ML IV SOLN: 20.0000 mg | INTRAVENOUS | Status: DC | PRN

## 2021-01-06 MED ORDER — LISINOPRIL 10 MG PO TABS
10.0000 mg | ORAL_TABLET | Freq: Every day | ORAL | Status: DC
  Administered 2021-01-06 – 2021-01-07 (×2): 10 mg via ORAL
  Filled 2021-01-06 (×2): qty 1

## 2021-01-06 MED ORDER — IOHEXOL 350 MG/ML SOLN
50.0000 mL | Freq: Once | INTRAVENOUS | Status: AC
  Administered 2021-01-06: 50 mL via INTRAVENOUS

## 2021-01-06 MED ORDER — HYDRALAZINE HCL 20 MG/ML IJ SOLN: 20.0000 mg | INTRAMUSCULAR | Status: DC | PRN

## 2021-01-06 MED ORDER — CHLORHEXIDINE GLUCONATE CLOTH 2 % EX PADS
6.0000 | MEDICATED_PAD | Freq: Every day | CUTANEOUS | Status: DC
  Administered 2021-01-06 – 2021-01-07 (×2): 6 via TOPICAL

## 2021-01-06 NOTE — Evaluation (Signed)
Occupational Therapy Evaluation Patient Details Name: Tiffany Washington MRN: [id number] DOB: January 13, 1941 Today's Date: 01/06/2021    History of Present Illness Pt is a 80 y.o. female who presented 6/8 with R-sided weakness and numbness. Imaging revealed an intraparenchymal hematoma within the L basal ganglia. PMH: aortic stenosis, arthritis, GERD, heart murmur, HOH, melanoma in situ of L UE, restless leg syndrome, and spondylolisthesis of lumbar region.   Clinical Impression   Pt admitted with above.  She scored 8/28 on the Short Blessed Test which is indicative of mild cognitive deficit, however, I question if hearing loss contributed to score.  She also demonstrates balance deficits which she reports are baseline for her, and she demonstrates impaired stereognosis resulting in decreased [facility].  Discussed follow up OT at discharge, but pt does not want therapy at this time, but verbalizes the process for obtaining an MD order and setting up an appointment if needed. She reports her spouse will provide supervision initially at discharge.  No further OT needs identified.     Follow Up Recommendations  No OT follow up    Equipment Recommendations  None recommended by OT    Recommendations for Other Services       Precautions / Restrictions Precautions Precautions: Fall Precaution Comments: SBP goal < 140 Restrictions Weight Bearing Restrictions: No      Mobility Bed Mobility               General bed mobility comments: Pt sitting up in recliner upon arrival.    Transfers Overall transfer level: Needs assistance Equipment used: None Transfers: Sit to/from Stand;Stand Pivot Transfers Sit to Stand: Min guard Stand pivot transfers: Min guard       General transfer comment: min guard for safety    Balance Overall balance assessment: Needs assistance Sitting-balance support: No upper extremity supported;Feet supported Sitting balance-Leahy Scale: Fair     Standing balance  support: Single extremity supported;Bilateral upper extremity supported;During functional activity;No upper extremity supported Standing balance-Leahy Scale: Fair Standing balance comment: Able to reach min off BOS without UE support when standing statically but prefers 1-2 UE support for mobility.                           ADL either performed or assessed with clinical judgement   ADL Overall ADL's : Needs assistance/impaired Eating/Feeding: Independent   Grooming: Wash/dry hands;Oral care;Wash/dry face;Brushing hair;Min guard;Standing   Upper Body Bathing: Set up;Sitting   Lower Body Bathing: Min guard;Sit to/from stand   Upper Body Dressing : Set up;Sitting   Lower Body Dressing: Min guard;Sit to/from stand   Toilet Transfer: Min guard;Ambulation;Comfort height toilet   Toileting- Clothing Manipulation and Hygiene: Min guard;Sit to/from stand       Functional mobility during ADLs: Min guard General ADL Comments: Pt reports she is at baseline and requires min guard assist due to antalgic gait due to chronic knee pain     Vision Baseline Vision/History: No visual deficits Patient Visual Report: No change from baseline Vision Assessment?: Yes Eye Alignment: Within Functional Limits Ocular Range of Motion: Within Functional Limits Alignment/Gaze Preference: Within Defined Limits Tracking/Visual Pursuits: Able to track stimulus in all quads without difficulty Convergence: Within functional limits Visual Fields: No apparent deficits     Agricultural engineer Tested?: Yes   Praxis Praxis Praxis tested?: Within functional limits    Pertinent Vitals/Pain Pain Assessment: No/denies pain     Hand Dominance Right   Extremity/Trunk Assessment  Upper Extremity Assessment Upper Extremity Assessment: RUE deficits/detail RUE Deficits / Details: Strength WFL.  She demonstrates full AROM, but does drop items - impairment with stereognosis RUE  Coordination: decreased fine motor   Lower Extremity Assessment Lower Extremity Assessment: Defer to PT evaluation RLE Deficits / Details: Slightly decreased sensation to light touch throughout R foot; intact and fairly symmetrical bil strength (MMT 4+ to 5 bil grossly), coordination, and dynamic proprioception RLE Sensation: decreased light touch (at foot) RLE Coordination: WNL   Cervical / Trunk Assessment Cervical / Trunk Assessment: Kyphotic   Communication Communication Communication: HOH   Cognition Arousal/Alertness: Awake/alert Behavior During Therapy: WFL for tasks assessed/performed Overall Cognitive Status: Within Functional Limits for tasks assessed                                 General Comments: Pt scored 8/28 on the Short BLessed Test which is indicative of mild cognitive deficit, however, unsure how much [facility] interferes with results as she demonstrated good safety awareness, and problem solving skills.  She was able to complete clock drawing task without difficulty   General Comments  SBP 161 - RN present and aware.  Recommend pt not drive initially to ensure she is back to baseline - pt agreeable.  Pt wishes to defer OPOT for [facility] at this time.  She wishes to wait to see how her hand progresses, and knows the process for setting up OPOT    Exercises     Shoulder Instructions      Home Living Family/patient expects to be discharged to:: Private residence Living Arrangements: Spouse/significant other Available Help at Discharge: Family;Available 24 hours/day Type of Home: House Home Access: Stairs to enter [employer] of Steps: 1 Entrance Stairs-Rails: None Home Layout: One level     Bathroom Shower/Tub: Teacher, early years/pre: Handicapped height Bathroom Accessibility: Yes   Home Equipment: Grab bars - tub/shower;Walker - 2 wheels;Walker - 4 wheels;Cane - quad;Cane - single point;Bedside commode          Prior  Functioning/Environment Level of Independence: Independent with assistive device(s)        Comments: uses SPC and intermittently RW for mobility. Pt drives. No longer works. Independent with finances, med management, household chores, drives        OT Problem List: Impaired balance (sitting and/or standing);Decreased cognition      OT Treatment/Interventions:      OT Goals(Current goals can be found in the care plan section) Acute Rehab OT Goals Patient Stated Goal: to go home OT Goal Formulation: All assessment and education complete, DC therapy  OT Frequency:     Barriers to D/C:            Co-evaluation              AM-PAC OT "6 Clicks" Daily Activity     Outcome Measure Help from another person eating meals?: None Help from another person taking care of personal grooming?: A Little Help from another person toileting, which includes using toliet, bedpan, or urinal?: A Little Help from another person bathing (including washing, rinsing, drying)?: A Little Help from another person to put on and taking off regular upper body clothing?: A Little Help from another person to put on and taking off regular lower body clothing?: A Little 6 Click Score: 19   End of Session Nurse Communication: Mobility status  Activity Tolerance: Patient tolerated treatment well  Patient left: in chair;with call bell/phone within reach;with chair alarm set  OT Visit Diagnosis: Unsteadiness on feet (R26.81)                Time: 8206-0156 OT Time Calculation (min): 29 min Charges:  OT General Charges $OT Visit: 1 Visit OT Evaluation $OT Eval Moderate Complexity: 1 Mod OT Treatments $Self Care/Home Management : 8-22 mins  Nilsa Nutting., OTR/L Acute Rehabilitation Services Pager [phone] Office [place], [employer] 01/06/2021, 3:01 PM

## 2021-01-06 NOTE — Progress Notes (Addendum)
STROKE TEAM PROGRESS NOTE    Interval History   No acute events overnight, patient up sitting in the chair at the side of her bed. CTA Head ordered as a part of vessel imaging given her BG bleed.   She states that at home her husband has checked her blood pressure and it has been WNL. She has not been diagnosed with HTN on an outpatient basis. Blood pressure this hospitalization notably elevated in the 130-170 range.   Pertinent Lab Work and Imaging    01/05/21 CT Head WO IV Contrast 1. Examination is positive for acute left-sided intracerebral hematoma centered around the left basal ganglia. Mild surrounding low attenuation edema noted. No significant midline shift. 2. Chronic small vessel ischemic disease and brain atrophy.  01/06/21 CT Head WO IV Contrast Stable intraparenchymal hematoma within the left basal ganglia with no significant associated mass effect. No interval hemorrhage.  01/06/21 CT Angio Head and Neck W WO IV Contrast No abnormal vascularity in the region of basal ganglia hemorrhage.  01/06/21 VAS US Carotid  Pending   10/20/20 Echocardiogram Complete   1. Left ventricular ejection fraction, by estimation, is 65 to 70%. The  left ventricle has normal function. The left ventricle has no regional  wall motion abnormalities. There is moderate left ventricular hypertrophy.  Left ventricular diastolic  parameters are consistent with Grade II diastolic dysfunction  (pseudonormalization). Elevated left ventricular end-diastolic pressure.  The E/e' is 15.   2. Right ventricular systolic function is normal. The right ventricular  size is normal. There is normal pulmonary artery systolic pressure.   3. Left atrial size was mildly dilated.   4. The mitral valve is abnormal. Trivial mitral valve regurgitation.  Moderate mitral annular calcification. Borderline mild MS - mean gradient  4.1 mmHg at 62 bpm.   5. The aortic valve is tricuspid. Aortic valve regurgitation is trivial.   Moderate to severe aortic valve stenosis. Aortic regurgitation PHT  measures 656 msec. Aortic valve area, by VTI measures 0.97 cm. Aortic  valve mean gradient measures 25.0 mmHg.  Aortic valve Vmax measures 3.27 m/s. DI is 0.34.   6. The inferior vena cava is normal in size with greater than 50%  respiratory variability, suggesting right atrial pressure of 3 mmHg.   Physical Examination   Constitutional: Calm, appropriate for condition  Cardiovascular: Normal RR Respiratory: No increased WOB   Mental status: AAOX4 Speech: Fluent with repetition and naming intact  Cranial nerves: EOMI, VFF, Face symmetric, Tongue midline, Shoulder shrug intact  Motor: Normal bulk and tone. No drift.   Dlt Bic Tri FgS Grp HF  KnF KnE PIF DoF  R 5 5 5 5 5 5 5 5 5 5   L 5 5 5 5 5 5 5 5 5 5   Sensory: Intact to light touch throughout  Coordination: Intact FNF  Reflexes: Deferred Gait: Deferred   NIHSS: 0   Assessment and Plan   Ms. NAHOMI HEGNER is a 80 y.o. 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   NEURO #L BG Bleed  Patient presented with the symptoms described above. CTH notable for L BG bleed which has been stable on subsequent imaging. BP in triage elevated at 185/65 requiring a Clevidipine gtt. CTA Head was negative for vascular  malformation that could have contributed to her bleed. Pending further vessel imaging with CUS. Echo deferred given she had one in March of this year. Lipid panel and Hemoglobin A1C ordered for tomorrow AM for accuracy. More than likely bleed is hypertensive in etiology given location, noted elevated blood pressures and lack of vascular malformation on CTA Head.  - Follow up remaining work up; stroke labs, CUS  -  SBP goal < 160  - No antiplatelets given bleed, home ASA stopped  -At discharge will place ambulatory referral to neurology for stroke follow up   CARDS #Hypertension Newly diagnosed with HTN, per chart review and patient she was not previously diagnosed. Was not taking anti hts at home. Currently on Clevidipine gtt, will wean off and start anti hypertensive medications.  - Start Lisinopril 10 mg and titrate Clev gtt off  - SBP goal < 160   #Hyperlipidemia From a stroke prevention stand point, the LDL goal is < 70. LDL pending.   RESP Oxygenating well on RA. No acute respiratory issues at this time  RENAL No electrolyte or renal abnormalities   GI  Passed bedside swallow eval, on heart healthy diet   ENDO #DMII  Hemoglobin A1C this admission pending   HEME Hemoglobin hematocrit and platelet count stable   ID Afebrile. No infectious processes at this time.   Hospital day # 1  [name], NP  [facility] Nurse Practitioner Patient seen and discussed with attending physician Dr. Leonie Man  I have personally obtained history,examined this patient, reviewed notes, independently viewed imaging studies, participated in medical decision making and plan of care.ROS completed by me personally and pertinent positives fully documented  I have made any additions or clarifications directly to the above note. Agree with note above.  Patient has presented with a small left basal ganglia hemorrhage likely of hypertensive etiology.  Follow-up MRI and CT scan shows stable appearance.  Continue strict blood pressure control with systolic goal below 771 for first 24 hours and then below 160 and close neurological monitoring.  Mobilize out of bed.  Therapy consults.  Check CT angiogram of the brain, echocardiogram, lipid profile we will A1c.  Long discussion with the patient and answered questions. This patient is critically ill and at significant risk of neurological worsening, death and  care requires constant monitoring of vital signs, hemodynamics,respiratory and cardiac monitoring, extensive review of multiple databases, frequent neurological assessment, discussion with family, other specialists and medical decision making of high complexity.I have made any additions or clarifications directly to the above note.This critical care time does not reflect procedure time, or teaching time or supervisory time of PA/NP/Med Resident etc but could involve care discussion time.  I spent 30 minutes of neurocritical care time  in the care of  this patient.      Antony Contras, MD Medical Director [place] Pager: [phone] 01/06/2021 5:20 PM   To contact Stroke Continuity provider, please refer to [email or web address] After hours, contact General Neurology

## 2021-01-06 NOTE — Evaluation (Signed)
Physical Therapy Evaluation & Discharge Patient Details Name: Tiffany Washington MRN: [id number] DOB: 12-Jun-1941 Today's Date: 01/06/2021   History of Present Illness  Pt is a 80 y.o. female who presented 6/8 with R-sided weakness and numbness. Imaging revealed an intraparenchymal hematoma within the L basal ganglia. PMH: aortic stenosis, arthritis, GERD, heart murmur, HOH, melanoma in situ of L UE, restless leg syndrome, and spondylolisthesis of lumbar region.   Clinical Impression  Pt presents with condition above. PTA, pt was mod I with all ADL's and functional mobility utilizing her RW or SPC. Pt appears and reports to be at her baseline. Pt reports a chronic limp on her L leg due to chronic L knee pain, which was present during this session, resulting in her needing 1-2 UE support for mobility stability. However, there were no significant apparent differences between her legs during MMT or testing of her coordination or dynamic proprioception. She did have slight numbness throughout her R foot though compared to her L. Pt performed all functional mobility at a min guard-supervision level this date. As pt is functioning at her baseline and will have the necessary level of assistance going home and all education has been completed with questions answered, PT will sign off at this time.     Follow Up Recommendations No PT follow up;Supervision for mobility/OOB    Equipment Recommendations  None recommended by PT    Recommendations for Other Services       Precautions / Restrictions Precautions Precautions: Fall Precaution Comments: SBP goal < 140 Restrictions Weight Bearing Restrictions: No      Mobility  Bed Mobility               General bed mobility comments: Pt sitting up in recliner upon arrival.    Transfers Overall transfer level: Needs assistance Equipment used: Rolling walker (2 wheeled) Transfers: Sit to/from Stand Sit to Stand: Supervision         General  transfer comment: Pt able to transfer to stand with supervision without LOB, pulling up on RW despite cues to push up from recliner instead.  Ambulation/Gait Ambulation/Gait assistance: Min Gaffer (Feet): 250 Feet Assistive device: Rolling walker (2 wheeled);1 person hand held assist Gait Pattern/deviations: Step-through pattern;Trendelenburg;Decreased weight shift to left;Decreased stance time - left Gait velocity: at her reported baseline Gait velocity interpretation: >2.62 ft/sec, indicative of community ambulatory General Gait Details: Pt with trendelenburg gait pattern on L with decreased L stance time/weight shift with pt reporting chronic L knee pain and limp secondary to this. Pt able to ambulate with RW safely without LOB with min guard-supervision and also when holding x1 hand instead of using RW. Pt reports this is her baseline.  Stairs            Wheelchair Mobility    Modified Rankin (Stroke Patients Only) Modified Rankin (Stroke Patients Only) Pre-Morbid Rankin Score: No significant disability Modified Rankin: No significant disability     Balance Overall balance assessment: Needs assistance Sitting-balance support: No upper extremity supported;Feet supported Sitting balance-Leahy Scale: Fair     Standing balance support: Single extremity supported;Bilateral upper extremity supported;During functional activity;No upper extremity supported Standing balance-Leahy Scale: Fair Standing balance comment: Able to reach min off BOS without UE support when standing statically but prefers 1-2 UE support for mobility.                             Pertinent Vitals/Pain Pain Assessment:  No/denies pain    Home Living Family/patient expects to be discharged to:: Private residence Living Arrangements: Spouse/significant other Available Help at Discharge: Family;Available 24 hours/day Type of Home: House Home Access: Stairs to  enter Entrance Stairs-Rails: None Entrance Stairs-Number of Steps: 1 Home Layout: One level Home Equipment: Grab bars - tub/shower;Walker - 2 wheels;Walker - 4 wheels;Cane - quad;Cane - single point;Bedside commode      Prior Function Level of Independence: Independent with assistive device(s)         Comments: uses SPC and intermittently RW for mobility. Pt drives. No longer works. Independent with finances, med management, household chores etc.     Hand Dominance   Dominant Hand: Right    Extremity/Trunk Assessment   Upper Extremity Assessment Upper Extremity Assessment: Defer to OT evaluation    Lower Extremity Assessment Lower Extremity Assessment: RLE deficits/detail RLE Deficits / Details: Slightly decreased sensation to light touch throughout R foot; intact and fairly symmetrical bil strength (MMT 4+ to 5 bil grossly), coordination, and dynamic proprioception RLE Sensation: decreased light touch (at foot) RLE Coordination: WNL    Cervical / Trunk Assessment Cervical / Trunk Assessment: Kyphotic  Communication   Communication: HOH  Cognition Arousal/Alertness: Awake/alert Behavior During Therapy: WFL for tasks assessed/performed Overall Cognitive Status: Within Functional Limits for tasks assessed                                 General Comments: A&Ox4.      General Comments General comments (skin integrity, edema, etc.): VSS on RA, SBP in 140s start of session    Exercises     Assessment/Plan    PT Assessment Patent does not need any further PT services  PT Problem List         PT Treatment Interventions      PT Goals (Current goals can be found in the Care Plan section)  Acute Rehab PT Goals Patient Stated Goal: to go home PT Goal Formulation: With patient/family Time For Goal Achievement: 01/07/21 Potential to Achieve Goals: Good    Frequency     Barriers to discharge        Co-evaluation               AM-PAC PT  "6 Clicks" Mobility  Outcome Measure Help needed turning from your back to your side while in a flat bed without using bedrails?: None Help needed moving from lying on your back to sitting on the side of a flat bed without using bedrails?: None Help needed moving to and from a bed to a chair (including a wheelchair)?: A Little Help needed standing up from a chair using your arms (e.g., wheelchair or bedside chair)?: A Little Help needed to walk in hospital room?: A Little Help needed climbing 3-5 steps with a railing? : A Little 6 Click Score: 20    End of Session Equipment Utilized During Treatment: Gait belt Activity Tolerance: Patient tolerated treatment well Patient left: in chair;with call bell/phone within reach;with family/visitor present   PT Visit Diagnosis: Other abnormalities of gait and mobility (R26.89);Unsteadiness on feet (R26.81)    Time: 1610-9604 PT Time Calculation (min) (ACUTE ONLY): 28 min   Charges:   PT Evaluation $PT Eval Low Complexity: 1 Low PT Treatments $Gait Training: 8-22 mins        Moishe Spice, PT, DPT Acute Rehabilitation Services  Pager: [phone] Office: [phone]   Orvan Falconer 01/06/2021,  1:19 PM

## 2021-01-07 ENCOUNTER — Other Ambulatory Visit (HOSPITAL_COMMUNITY): Payer: PPO

## 2021-01-07 ENCOUNTER — Inpatient Hospital Stay (HOSPITAL_COMMUNITY): Payer: PPO

## 2021-01-07 DIAGNOSIS — I610 Nontraumatic intracerebral hemorrhage in hemisphere, subcortical: Secondary | ICD-10-CM

## 2021-01-07 LAB — LIPID PANEL
Cholesterol: 250 mg/dL — ABNORMAL HIGH (ref 0–200)
HDL: 41 mg/dL (ref >40)
LDL Cholesterol: 159 mg/dL — ABNORMAL HIGH (ref 0–99)
Total CHOL/HDL Ratio: 6.1 RATIO
Triglycerides: 252 mg/dL — ABNORMAL HIGH (ref <150)
VLDL: 50 mg/dL — ABNORMAL HIGH (ref 0–40)

## 2021-01-07 LAB — BASIC METABOLIC PANEL
Anion gap: 9 (ref 5–15)
BUN: 17 mg/dL (ref 8–23)
CO2: 26 mmol/L (ref 22–32)
Calcium: 9.5 mg/dL (ref 8.9–10.3)
Chloride: 102 mmol/L (ref 98–111)
Creatinine, Ser: 0.88 mg/dL (ref 0.44–1.00)
GFR, Estimated: >60 mL/min (ref >60)
Glucose, Bld: 114 mg/dL — ABNORMAL HIGH (ref 70–99)
Potassium: 3.8 mmol/L (ref 3.5–5.1)
Sodium: 137 mmol/L (ref 135–145)

## 2021-01-07 LAB — HEMOGLOBIN A1C
Hgb A1c MFr Bld: 6 % — ABNORMAL HIGH (ref 4.8–5.6)
Mean Plasma Glucose: 126 mg/dL

## 2021-01-07 MED ORDER — ATORVASTATIN CALCIUM 80 MG PO TABS: 80.0000 mg | ORAL_TABLET | Freq: Every day | ORAL | Status: DC

## 2021-01-07 MED ORDER — ROSUVASTATIN CALCIUM 20 MG PO TABS: 20.0000 mg | ORAL_TABLET | Freq: Every day | ORAL | 3 refills | Status: AC

## 2021-01-07 MED ORDER — LISINOPRIL 10 MG PO TABS: 10.0000 mg | ORAL_TABLET | Freq: Every day | ORAL | 3 refills | Status: DC

## 2021-01-07 NOTE — Discharge Instructions (Addendum)
Mora,   You have been hospitalized due to a bleed that occurred to the left side of your brain, affecting the right side. We believe that high blood pressure or hypertension caused your bleed.   You have been started on a medication for your blood pressure called Lisinopril. Please pick this medication up from your pharmacy and start taking it daily starting tomorrow.  Your cholesterol was noted to be high so you were also started on a medication called Crestor or Rosuvastatin. Please pick up this medication as well and take it daily starting tomorrow.   STOP taking Aspirin because you have had a bleed within the brain.   Please see your primary care physician Dr. Ardeth Perfect since you have been hospitalized. You are to make this appointment.   You are to follow up with the stroke doctor after you discharge. Please expect a call regarding scheduling this appointment.   It was a pleasure caring for you. Your medical team wishes you the best.   Sincerely   [facility]

## 2021-01-07 NOTE — Discharge Summary (Addendum)
Stroke Discharge Summary  Patient ID: Tiffany Washington       MRN: [id number]      DOB: April 28, 1941  Date of Admission: 01/05/2021 Date of Discharge: 01/07/2021  Attending Physician: Dr. 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 hypertensive emergency   Allergies as of 01/07/2021       Reactions   Carbamazepine Other (See Comments)   Doesn't remember reaction   Ciprofloxacin Other (See Comments)   Patient stated she "cannot take this"   Diclofenac Other (See Comments)   Reaction not recalled   Indomethacin Other (See Comments)   Reaction not recalled   Meloxicam Other (See Comments)   Reaction not recalled   Nortriptyline Other (See Comments)   Reaction not recalled   Voltaren [diclofenac Sodium] Other (See Comments)   Reaction not recalled   Gabapentin Other (See Comments)   Makes the patient unable to sleep   Pamelor [nortriptyline Hcl] Other (See Comments)   insomnia   Ropinirole Other (See Comments)   Speed up the restless legs   Sulfa Antibiotics Diarrhea, Nausea And Vomiting        Medication List     STOP taking these medications    aspirin EC 81 MG tablet   CRANBERRY PO   moxifloxacin 0.5 % ophthalmic solution Commonly known as: VIGAMOX   potassium chloride 10 MEQ tablet Commonly known as: KLOR-CON   POTASSIUM PO   prednisoLONE acetate 1 % ophthalmic suspension Commonly known as: PRED FORTE   Prolensa 0.07 % Soln Generic drug: Bromfenac Sodium       TAKE these medications    acetaminophen 325 MG tablet Commonly known as: TYLENOL Take 650 mg by mouth every 6 (six) hours as needed for moderate pain or headache.   B COMPLEX 100 PO Take 1 tablet by mouth daily.   lisinopril 10 MG tablet Commonly known as: ZESTRIL Take 1 tablet (10 mg total) by mouth daily. Start taking on: January 08, 2021   multivitamin with  minerals tablet Take 1 tablet by mouth daily.   pramipexole 0.25 MG tablet Commonly known as: MIRAPEX Take 0.25 mg by mouth 2 (two) times daily.   rosuvastatin 20 MG tablet Commonly known as: CRESTOR Take 1 tablet (20 mg total) by mouth daily.   vitamin C 1000 MG tablet Take 1,000 mg by mouth daily.   Zinc 30 MG Caps Take 30 mg by mouth daily.         HISTORY OF PRESENT ILLNESS  Tiffany Washington is a 80 y.o. 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 who presents to ED with R arm and leg numbness since this AM. She had mild nausea this AM and has overall been feeling tired for a few days. Denies focal weakness, on exam only focal weakness is 4+/5 R grip no drift in any extremity.  BP up to 185/65 in triage now down to SBP 134 on clevidipine. No prior known hx HTN. No recent trauma. Not on AC. On daily ASA 81mg  daily only.  DISCHARGE EXAM Blood pressure 113/60, pulse 60, temperature 97.7 F (36.5 C), temperature source Oral, resp. rate 19, height 5\' 3"  (1.6 m), weight  84 kg, SpO2 97 %.  Constitutional: Calm, appropriate for condition Cardiovascular: Normal RR Respiratory: No increased WOB   Mental status: AAOX4 Speech: Fluent with repetition and naming intact  Cranial nerves: EOMI, VFF, Face symmetric, Tongue midline, Shoulder shrug intact  Motor: Normal bulk and tone. No drift.   Dlt Bic Tri FgS Grp HF  KnF KnE PIF DoF  R 5 5 5 5 5 5 5 5 5 5   L 5 5 5 5 5 5 5 5 5 5   Sensory: Intact to light touch throughout  Coordination: Intact FNF  Reflexes: Deferred Gait: Deferred    NIHSS: 0   DISCHARGE PLAN Tiffany Washington is a 80 y.o. 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    #L BG Bleed  Patient presented with the symptoms described above. CTH notable for L BG bleed which has been stable on subsequent imaging. BP in triage elevated at 185/65 requiring a Clevidipine gtt. CTA Head was negative for vascular malformation that could have contributed to her bleed. CUS  with 1-39 % stenosis in the right and left carotid. Echo deferred given she had one in March of this year. Lipid panel w/LDL 159. A1C pending at discharge will follow up on this at stroke follow up appointment. More than likely bleed is hypertensive in etiology given location, noted elevated blood pressures and lack of vascular malformation on CTA Head. Her SBP goal at discharge is normotensive < 130/80. She was instructed to stop taking Aspirin. Atorvastatin 80 mg was started this admission given LDL of 159 and transitioned to Rosuvastatin 20 mg at discharge due to insurance coverage. At discharge placed ambulatory referral to neurology for stroke follow up in 6-8 weeks. She was instructed to follow up with her PCP Dr. Ardeth Perfect. Of note, she was evaluated by PT and OT this admission who signed off recommending no follow up; just supervision at home. She lives with her husband.    #Hypertension Newly diagnosed with HTN, per chart review and patient she was not previously diagnosed. Initially required a Clevidipine gtt for BP control, then oral Lisinopril 10 mg was started. Pressures responded nicely to Lisinopril. She will discharge on 10 mg. Cr and lytes stable with Lisinopril. SBP goal is normotension, < 130/80.    #Hyperlipidemia From a stroke prevention stand point, the LDL goal is < 70. LDL 159. Discharged on Rosuvastatin 20 mg QD.    #Dysphagia Screening  Passed bedside swallow eval  and was placed on heart healthy diet.     [name], NP  [facility] Nurse Practitioner  Patient seen and discussed with attending physician Dr. Leonie Man   34 minutes were spent preparing discharge. I have personally obtained history,examined this patient, reviewed notes, independently viewed imaging studies, participated in medical decision making and plan of care.ROS completed by me personally and pertinent positives fully documented  I have made any additions or clarifications directly to the above note. Agree with note above.    Antony Contras, MD Medical Director [facility] Pager: [phone] 01/07/2021 3:17 PM

## 2021-01-07 NOTE — Progress Notes (Signed)
Speech-language-Cognitive assessment    01/07/21 1104  SLP Visit Information  SLP Received On 01/07/21  SLP Time Calculation  SLP Start Time (ACUTE ONLY) 1106  SLP Stop Time (ACUTE ONLY) 1131  SLP Time Calculation (min) (ACUTE ONLY) 25 min  General Information  HPI Pt is a 80 y.o. female who presented 6/8 with R-sided weakness and numbness. Imaging revealed an intraparenchymal hematoma within the L basal ganglia. PMH: aortic stenosis, arthritis, GERD, heart murmur, HOH, melanoma in situ of L UE, restless leg syndrome, and spondylolisthesis of lumbar region.  Prior Functional Status  Cognitive/Linguistic Baseline WFL  Type of Home House   Lives With Spouse  Available Help at Discharge Family;Available 24 hours/day  Pain Assessment  Pain Assessment Faces  Faces Pain Scale 0  Oral Motor/Sensory Function  Overall Oral Motor/Sensory Function WFL  Cognition  Overall Cognitive Status Impaired/Different from baseline  Arousal/Alertness Awake/alert  Orientation Level Oriented X4  Attention Sustained  Sustained Attention Appears intact  Memory Impaired  Memory Impairment Retrieval deficit  Awareness Impaired  Awareness Impairment Anticipatory impairment  Problem Solving Appears intact  Safety/Judgment Appears intact  Auditory Comprehension  Overall Auditory Comprehension Appears within functional limits for tasks assessed  Visual Recognition/Discrimination  Discrimination Not tested  Reading Comprehension  Reading Status Not tested  Expression  Primary Mode of Expression Verbal  Verbal Expression  Overall Verbal Expression Appears within functional limits for tasks assessed  Pragmatics No impairment  Written Expression  Dominant Hand Right  Written Expression Not tested  Motor Speech  Overall Motor Speech Appears within functional limits for tasks assessed  Intelligibility Intelligible  Motor Planning [facility]  SLP - End of Session  Patient left in chair;with call bell/phone  within reach  Assessment  Clinical Impression Statement (ACUTE ONLY) Pt demonstrated significant difficulty on SLUMS cognitive assessment scoring 17/30. Her word retrieval with delay was 1/5 independent; lost points for mental calculation, placing time on clock, and attention to digit recall. She is responsible for finances and does not take medications prior to admission. Her and her husband are active and she reports "keeping him straight". It is recommended that she have initial supervision with tasks at home of greater complexity. Reports she is leaving today.  SLP Recommendation/Assessment All further Speech Lanaguage Pathology  needs can be addressed in the next venue of care (if needed)  SLP Visit Diagnosis Cognitive communication deficit (R41.841)  SLP Recommendations  Follow up Recommendations Other (comment) (see impressions)  SLP Equipment None recommended by SLP  Individuals Consulted  Consulted and Agree with Results and Recommendations Patient  SLP Goals  Progress/Goals/Alternative treatment plan discussed with pt/caregiver and they Agree  SLP Evaluations  $ SLP Speech Visit 1 Visit  SLP Evaluations  $ SLP EVAL LANGUAGE/SOUND PRODUCTION 1 Procedure  Orbie Pyo Arriona Prest M.Ed Risk analyst [phone] Office [phone]

## 2021-01-19 DIAGNOSIS — R4589 Other symptoms and signs involving emotional state: Secondary | ICD-10-CM | POA: Diagnosis not present

## 2021-01-19 DIAGNOSIS — R200 Anesthesia of skin: Secondary | ICD-10-CM | POA: Diagnosis not present

## 2021-01-19 DIAGNOSIS — Z8679 Personal history of other diseases of the circulatory system: Secondary | ICD-10-CM | POA: Diagnosis not present

## 2021-01-19 DIAGNOSIS — E785 Hyperlipidemia, unspecified: Secondary | ICD-10-CM | POA: Diagnosis not present

## 2021-01-19 DIAGNOSIS — I10 Essential (primary) hypertension: Secondary | ICD-10-CM | POA: Diagnosis not present

## 2021-01-19 DIAGNOSIS — I350 Nonrheumatic aortic (valve) stenosis: Secondary | ICD-10-CM | POA: Diagnosis not present

## 2021-02-23 ENCOUNTER — Ambulatory Visit: Payer: PPO | Admitting: Adult Health

## 2021-02-23 ENCOUNTER — Encounter: Payer: Self-pay | Admitting: Adult Health

## 2021-02-23 VITALS — BP 119/69 | HR 66 | Ht 64.0 in | Wt 177.6 lb

## 2021-02-23 DIAGNOSIS — E785 Hyperlipidemia, unspecified: Secondary | ICD-10-CM | POA: Diagnosis not present

## 2021-02-23 DIAGNOSIS — G2581 Restless legs syndrome: Secondary | ICD-10-CM | POA: Diagnosis not present

## 2021-02-23 DIAGNOSIS — I10 Essential (primary) hypertension: Secondary | ICD-10-CM | POA: Diagnosis not present

## 2021-02-23 DIAGNOSIS — I610 Nontraumatic intracerebral hemorrhage in hemisphere, subcortical: Secondary | ICD-10-CM | POA: Diagnosis not present

## 2021-02-23 NOTE — Patient Instructions (Addendum)
Continue atorvastatin for secondary stroke prevention  Continue to follow up with PCP regarding cholesterol and blood pressure management  Maintain strict control of hypertension with blood pressure goal below 130/90 and cholesterol with LDL cholesterol (bad cholesterol) goal below 70 mg/dL.      Followup in the future with me in 3 months or call earlier if needed     Thank you for coming to see [place] at [facility]. I hope we have been able to provide you high quality care today.  You may receive a patient satisfaction survey over the next few weeks. We would appreciate your feedback and comments so that we may continue to improve ourselves and the health of our patients.     Sensory Loss After a Stroke A stroke can damage the parts of your brain that control your body's senses, including seeing, tasting, swallowing, smelling, or feeling touch or pressure. As a result, you may have sensory loss. This can also include problems feelingtemperature changes or moving your body in a coordinated way. You may have problems with all of your senses or only some of them. By following a treatment plan, you may recover lost senses and learn to manage thechanges to your lifestyle. How to manage lifestyle changes Managing stress A stroke is a life-changing event that can greatly increase stress. If you are feeling overwhelmed, work with your health care provider to develop a treatment plan. This may include: Joining a local support group. Ask your health care provider or physical therapist for suggestions. Seeking professional counseling. Doing regular exercise. This can help relieve stress and improve sleep. Follow these instructions at home: Eating and drinking  You may have problems with swallowing food and fluids after a stroke. These problems can be due to: Changes in your muscles. Sensory changes, such as: Difficulty feeling the consistency or size of a piece of food in  your mouth. Not being able to feel the need to clear your throat. You may need to: Take smaller bites and chew thoroughly. Make sure you have swallowed all the food in your mouth before taking another bite. Sit in an upright position when eating or drinking. Avoid distractions while eating or drinking. Stay upright for 30-45 minutes after eating. Change the texture of some foods and drinks. This may include: Eating foods that are smooth and have a mashed consistency (puree). Using thickening liquids. Follow instructions from your health care provider about eating and drinking restrictions.  Activity Return to your normal activities as told by your health care provider. Ask your health care provider what activities are safe for you. Avoid spending too much time sitting or lying down. If you must be in a chair or bed, change positions regularly. You may have problems doing basic daily tasks. Ask your health care provider about getting extra help at home. Lifestyle Do not drink alcohol. Do not use any products that contain nicotine or tobacco. These products include cigarettes, chewing tobacco, and vaping devices, such as e-cigarettes. If you need help quitting, ask your health care provider. Safety Use devices to help you move around (assistive devices), such as a wheelchair or walker, as directed by your health care team. Your risk of falling is higher after a stroke. You may have difficulty feeling your legs and feet or coordinating your movements. To lower your risk of falling: Wear prescription eye glasses at all times when moving around. Turn on lights, or use night lights, to avoid having to move around in the  dark. Use grab bars in bathrooms and handrails in stairways. Keep walkways clear in your home by removing area rugs, cords, and clutter from the floor. Your risk of getting burned is higher after a stroke. To lower your risk of burns: Test the water temperature before taking a  shower or washing your hands. Allow hot foods to cool slightly before eating. Use potholders when handling hot pans. When using sharp objects, such as scissors or knives, use your healthy hand. Do not handle sharp objects with your hand that was affected by your stroke, if this applies. Check your affected side daily for any signs of cuts, bruises, or injuries that you did not feel. If you do have a cut or wound, check it every day for signs of infection. Check for: Redness or swelling. Blood or fluid. Warmth. Pus or a bad smell. General instructions Take over-the-counter and prescription medicines only as told by your health care provider. Wear arm or leg braces as told by your health care team. Get help at home as needed. You may need help getting dressed, bathing, using the bathroom, eating, or doing other activities. Treatment for sensory loss may include physical, occupational, or speech therapy, and the use of assistive devices. Keep all follow-up visits. This is important. Where to find support Search for local support groups at the American Stroke Association group finder at www.stroke.org Contact a health care provider if: You develop a wound or burn that is not healing. You are unsteady and almost fall. You have side effects from your medicines. You become depressed or anxious. Get help right away if: You have feelings of hurting yourself or others. You develop a wound that appears infected. This may look red, swollen, have blood or fluid coming from it, feel warm, or have pus or a bad smell. You fall while on blood thinning medicine. This can lead to serious bleeding in or around the brain. You develop new weakness or numbness. You have an allergic reaction to medicines. Summary It is common to have sensory loss after a stroke. Sensory loss means that you have problems with some or all of your senses, such as vision, taste, hearing, smell, and touch. You may have problems  with all of your senses or only some of them. By following a treatment plan, you may recover lost senses and learn to manage the changes to your lifestyle. Assistive devices, such as a wheelchair or walker, can help you to move around. You may need to make changes to your home and lifestyle after a stroke to help you live safely and independently. This information is not intended to replace advice given to you by your health care provider. Make sure you discuss any questions you have with your healthcare provider. Document Revised: 07/13/2020 Document Reviewed: 07/13/2020 Elsevier Patient Education  2022 [place].    Restless Legs Syndrome Restless legs syndrome is a condition that causes uncomfortable feelings or sensations in the legs, especially while sitting or lying down. The sensations usually cause an overwhelming urge to move the legs. The arms can alsosometimes be affected. The condition can range from mild to severe. The symptoms often interfere witha person's ability to sleep. What are the causes? The cause of this condition is not known. What increases the risk? The following factors may make you more likely to develop this condition: Being older than 50. Pregnancy. Being a woman. In general, the condition is more common in women than in men. A family history of the  condition. Having iron deficiency. Overuse of caffeine, nicotine, or alcohol. Certain medical conditions, such as kidney disease, Parkinson's disease, or nerve damage. Certain medicines, such as those for high blood pressure, nausea, colds, allergies, depression, and some heart conditions. What are the signs or symptoms? The main symptom of this condition is uncomfortable sensations in the legs, such as: Pulling. Tingling. Prickling. Throbbing. Crawling. Burning. Usually, the sensations: Affect both sides of the body. Are worse when you sit or lie down. Are worse at night. These may wake you up or make  it difficult to fall asleep. Make you have a strong urge to move your legs. Are temporarily relieved by moving your legs. The arms can also be affected, but this is rare. People who have this conditionoften have tiredness during the day because of their lack of sleep at night. How is this diagnosed? This condition may be diagnosed based on: Your symptoms. Blood tests. In some cases, you may be monitored in a sleep lab by a specialist (a sleep study). This can detect any disruptions in your sleep. How is this treated? This condition is treated by managing the symptoms. This may include: Lifestyle changes, such as exercising, using relaxation techniques, and avoiding caffeine, alcohol, or tobacco. Medicines. Anti-seizure medicines may be tried first. Follow these instructions at home: General instructions Take over-the-counter and prescription medicines only as told by your health care provider. Use methods to help relieve the uncomfortable sensations, such as: Massaging your legs. Walking or stretching. Taking a cold or hot bath. Keep all follow-up visits as told by your health care provider. This is important. Lifestyle     Practice good sleep habits. For example, go to bed and get up at the same time every day. Most adults should get 7-9 hours of sleep each night. Exercise regularly. Try to get at least 30 minutes of exercise most days of the week. Practice ways of relaxing, such as yoga or meditation. Avoid caffeine and alcohol. Do not use any products that contain nicotine or tobacco, such as cigarettes and e-cigarettes. If you need help quitting, ask your health care provider. Contact a health care provider if: Your symptoms get worse or they do not improve with treatment. Summary Restless legs syndrome is a condition that causes uncomfortable feelings or sensations in the legs, especially while sitting or lying down. The symptoms often interfere with a person's ability to  sleep. This condition is treated by managing the symptoms. You may need to make lifestyle changes or take medicines. This information is not intended to replace advice given to you by your health care provider. Make sure you discuss any questions you have with your healthcare provider. Document Revised: 09/05/2019 Document Reviewed: 08/06/2017 Elsevier Patient Education  2022 [employer].

## 2021-02-23 NOTE — Progress Notes (Signed)
[facility] [address] [place]. [place] [address] [phone]       HOSPITAL FOLLOW UP NOTE  Ms. Tiffany Washington Date of Birth:  Dec 21, 1940 Medical Record Number:  [id number]   Reason for Referral:  hospital stroke follow up    SUBJECTIVE:   CHIEF COMPLAINT:  Chief Complaint  Patient presents with   Brain hemorrhage    Rm 2 husband- Tiffany Washington "feet feel hot, burn, sting, a little numb/itchy on right side and face"    HPI:    Ms. Tiffany Washington is a 80 y.o. 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  Personally reviewed hospitalization pertinent progress notes, lab work and imaging summary provided.  Evaluated by Dr. 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  CTA head/neck negative for vascular malformation.  Carotid ultrasound bilateral 1 to 39% stenosis.  2D echo deferred as recently completed in March.  BP on admission 185/65 with no prior HTN history requiring Cleviprex gtt and transition to lisinopril 10 mg daily with adequate control and advised follow-up with PCP.  LDL 159.  A1c 6.0.  Initiated Crestor 20 mg daily at discharge and advised to discontinue aspirin which she was on PTA.   PT/OT no therapy needs and discharged home.  Today, 02/23/2021, Mr. Tiffany Washington is being seen for hospital follow-up accompanied by her husband. Reports right hand and foot numbness, right leg sensory impairment and right facial itching/burning sensation. Unsure of any improvement since discharge.  Uses cane for ambulation which she was using PTA and denies any recent falls. Blood pressure today 119/69 - monitors at home and has been stable. Plans on repeat lab work with PCP  in the next couple of months.  She does have chronic history of RLS currently on Mirapex managed by PCP previously tried multiple prior medications including gabapentin, nortriptyline, and Requip without benefit or intolerance.  No further concerns at this time.     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 (HCC)    Restless leg syndrome    Spondylolisthesis of lumbar region    Wears dentures    upper   Wears glasses     PSH:  Past Surgical History:  Procedure Laterality Date   BACK SURGERY  10/2019   CESAREAN SECTION     COLONOSCOPY     COLONOSCOPY W/ BIOPSIES AND POLYPECTOMY     KNEE SURGERY Bilateral    Bilateral   MULTIPLE TOOTH EXTRACTIONS     POLYPECTOMY     UPPER GASTROINTESTINAL ENDOSCOPY     VAGINAL HYSTERECTOMY      Social History:  Social History   Socioeconomic History   Marital status: Married    Spouse name: Tiffany Washington   Number of children: 2   Years of education: Not on file   Highest education level: High school graduate  Occupational History   Not on file  Tobacco Use  Smoking status: Never   Smokeless tobacco: Never  Vaping Use   Vaping Use: Never used  Substance and Sexual Activity   Alcohol use: No   Drug use: No   Sexual activity: Not on file  Other Topics Concern   Not on file  Social History Narrative   Working on a farm   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   She is married for 52 years and they have two children.   Social Determinants of Health   Financial Resource Strain: Not on file  Food Insecurity: Not on file  Transportation Needs: Not on file  Physical Activity: Not on file  Stress: Not on file  Social Connections: Not on file   Intimate Partner Violence: Not on file    Family History:  Family History  Problem Relation Age of Onset   Hypertension Mother    Congestive Heart Failure Mother        Died, 2   Heart failure Mother    Pulmonary embolism Father        Died, 81   Diabetes Sister    CAD Brother 35       CABG   Diabetes Brother    Gout Son     Medications:   Current Outpatient Medications on File Prior to Visit  Medication Sig Dispense Refill   acetaminophen (TYLENOL) 325 MG tablet Take 650 mg by mouth every 6 (six) hours as needed for moderate pain or headache.     Ascorbic Acid (VITAMIN C) 1000 MG tablet Take 1,000 mg by mouth daily.     B Complex Vitamins (B COMPLEX 100 PO) Take 1 tablet by mouth daily.     lisinopril (ZESTRIL) 10 MG tablet Take 1 tablet (10 mg total) by mouth daily. 60 tablet 3   Multiple Vitamins-Minerals (MULTIVITAMIN WITH MINERALS) tablet Take 1 tablet by mouth daily.      pramipexole (MIRAPEX) 0.25 MG tablet Take 0.25 mg by mouth 2 (two) times daily.      rosuvastatin (CRESTOR) 20 MG tablet Take 1 tablet (20 mg total) by mouth daily. 30 tablet 3   Zinc 30 MG CAPS Take 30 mg by mouth daily.     No current facility-administered medications on file prior to visit.    Allergies:   Allergies  Allergen Reactions   Carbamazepine Other (See Comments)    Doesn't remember reaction   Ciprofloxacin Other (See Comments)    Patient stated she "cannot take this"   Diclofenac Other (See Comments)    Reaction not recalled   Indomethacin Other (See Comments)    Reaction not recalled   Meloxicam Other (See Comments)    Reaction not recalled   Nortriptyline Other (See Comments)    Reaction not recalled   Voltaren [Diclofenac Sodium] Other (See Comments)    Reaction not recalled   Gabapentin Other (See Comments)    Makes the patient unable to sleep   Pamelor [Nortriptyline Hcl] Other (See Comments)    insomnia   Ropinirole Other (See Comments)    Speed up the restless legs    Sulfa Antibiotics Diarrhea and Nausea And Vomiting      OBJECTIVE:  Physical Exam  Vitals:   02/23/21 0903  BP: 119/69  Pulse: 66  Weight: 177 lb 9.6 oz (80.6 kg)  Height: '5\' 4"'$  (1.626 m)   Body mass index is 30.48 kg/m. No results found.  Post stroke PHQ 2/9 Depression screen [facility] 2/9 02/23/2021  Decreased Interest 0  Down, Depressed, Hopeless 0  PHQ - 2 Score 0     General: well developed, well nourished, very pleasant elderly Caucasian female, seated, in no evident distress Head: head normocephalic and atraumatic.   Neck: supple with no carotid or supraclavicular bruits Cardiovascular: regular rate and rhythm Musculoskeletal: no deformity Skin:  no rash/petichiae Vascular:  Normal pulses all extremities   Neurologic Exam Mental Status: Awake and fully alert.  Fluent speech and language.  Oriented to place and time. Recent and remote memory intact. Attention span, concentration and fund of knowledge appropriate. Mood and affect appropriate.  Cranial Nerves: Fundoscopic exam reveals sharp disc margins. Pupils equal, briskly reactive to light. Extraocular movements full without nystagmus. Visual fields full to confrontation. Hearing intact. Facial sensation intact. Face, tongue, palate moves normally and symmetrically.  Motor: Normal bulk and tone. Normal strength in all tested extremity muscles Sensory.: intact to touch , pinprick , position and vibratory sensation although reports altered sensation right hand to light touch Coordination: Rapid alternating movements normal in all extremities. Finger-to-nose and heel-to-shin performed accurately bilaterally. Gait and Station: Arises from chair without difficulty. Stance is normal. Gait demonstrates normal stride length and balance with use of cane.  Reflexes: 1+ and symmetric. Toes downgoing.     NIHSS  1 Modified Rankin  2      ASSESSMENT: Tiffany Washington is a 80 y.o. 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 Vascular risk factors include new diagnosis of HTN, HLD, and aortic stenosis.      PLAN:  L BG ICH :  Residual deficit: Right hemisensory impairment with paresthesias.  Discussed typical recovery time and potential of further improvement of symptoms.  No indication for medication management at this time. Continue Crestor 20 mg daily for secondary stroke prevention.   No indication for antithrombotic at this present time as no prior stroke hx -advised to further discuss with cardiology for possible need of aspirin from their standpoint Discussed secondary stroke prevention measures and importance of close PCP follow up for aggressive stroke risk factor management. I have gone over the pathophysiology of stroke, warning signs and symptoms, risk factors and their management in some detail with instructions to go to the closest emergency room for symptoms of concern. HTN: BP goal <130/90.  New diagnosis during recent admission.  Stable on lisinopril 10 mg daily per PCP HLD: LDL goal <70. Recent LDL 159 -continue Crestor 20 mg daily.  Has follow-up with PCP in the next 1 to 2 months with plans on repeating lipid panel RLS: Currently managed by PCP on Mirapex previously tried multiple medications with listed intolerances.     Follow up in 3 months or call earlier if needed   CC:  [facility] provider: Dr. Leonie Man PCP: Velna Hatchet, MD    I spent 52 minutes of face-to-face and non-face-to-face time with patient and husband.  This included previsit chart review including review of recent hospitalization, lab review, study review, electronic health record documentation, patient education regarding recent stroke including etiology, secondary stroke prevention measures and aggressive stroke risk factor management, residual deficits and typical recovery time, history of RLS and answered all questions to patient and husband's satisfaction   Frann Rider,  AGNP-BC  [facility] [address] [place], [place] [address]  Phone [phone] Fax [phone] Note: This document was prepared with digital dictation and possible smart phrase technology. Any transcriptional errors that result from this process are unintentional.

## 2021-02-24 NOTE — Progress Notes (Signed)
I agree with the above plan 

## 2021-03-10 IMAGING — RF DG C-ARM 1-60 MIN
1 series · 2 of 2 positions shown · non-contrast
Comparison: None.

CLINICAL DATA: Surgical posterior fusion of L4-5.

EXAM:
LUMBAR SPINE - 2-3 VIEW; DG C-ARM 1-60 MIN
FLUOROSCOPY TIME:  28 seconds.

[Series 1: run · 2 of 2 slices shown]
[im 1/2]
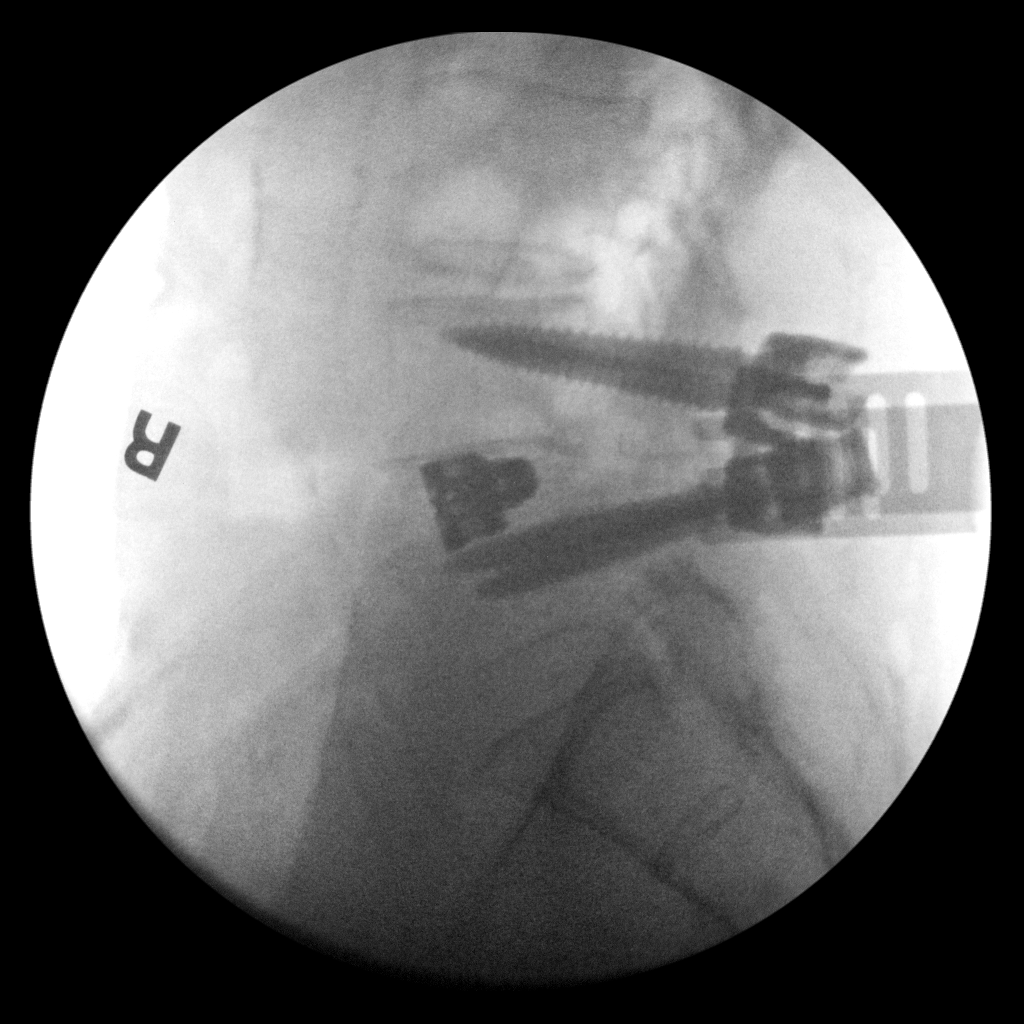
[im 2/2]
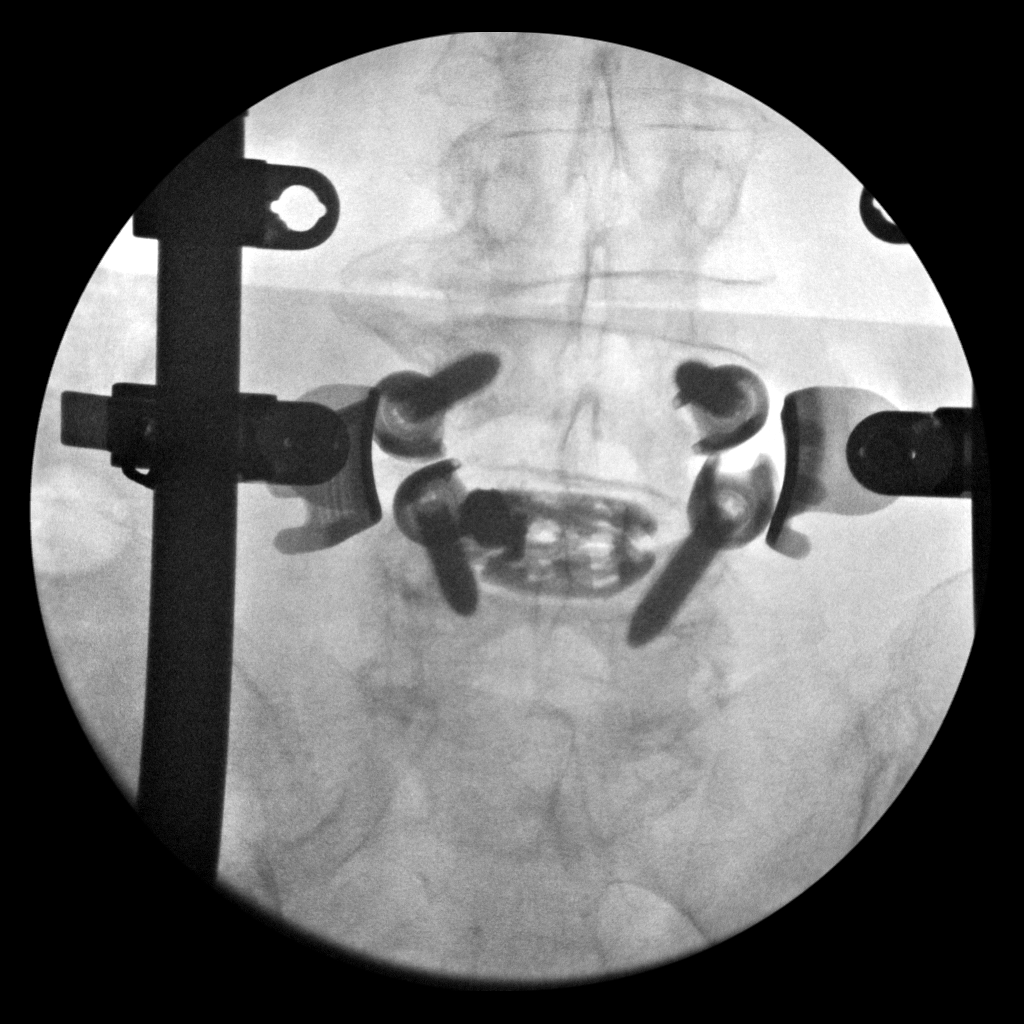

[2 of 2 positions shown; findings below may reference images not displayed]

FINDINGS: Two intraoperative fluoroscopic images were obtained of the lower
lumbar spine. These images demonstrate the patient be status post
surgical posterior fusion of L4-5 with bilateral intrapedicular
screw placement and interbody fusion. Good alignment of vertebral
bodies is noted.
IMPRESSION: Status post surgical posterior fusion of L4-5.

## 2021-03-10 IMAGING — CR DG LUMBAR SPINE 1V
1 series · 1 of 1 positions shown · non-contrast
Comparison: October 21, 2019

CLINICAL DATA: Localization L4-5

EXAM:
LUMBAR SPINE - 1 VIEW

[xtable lateral]
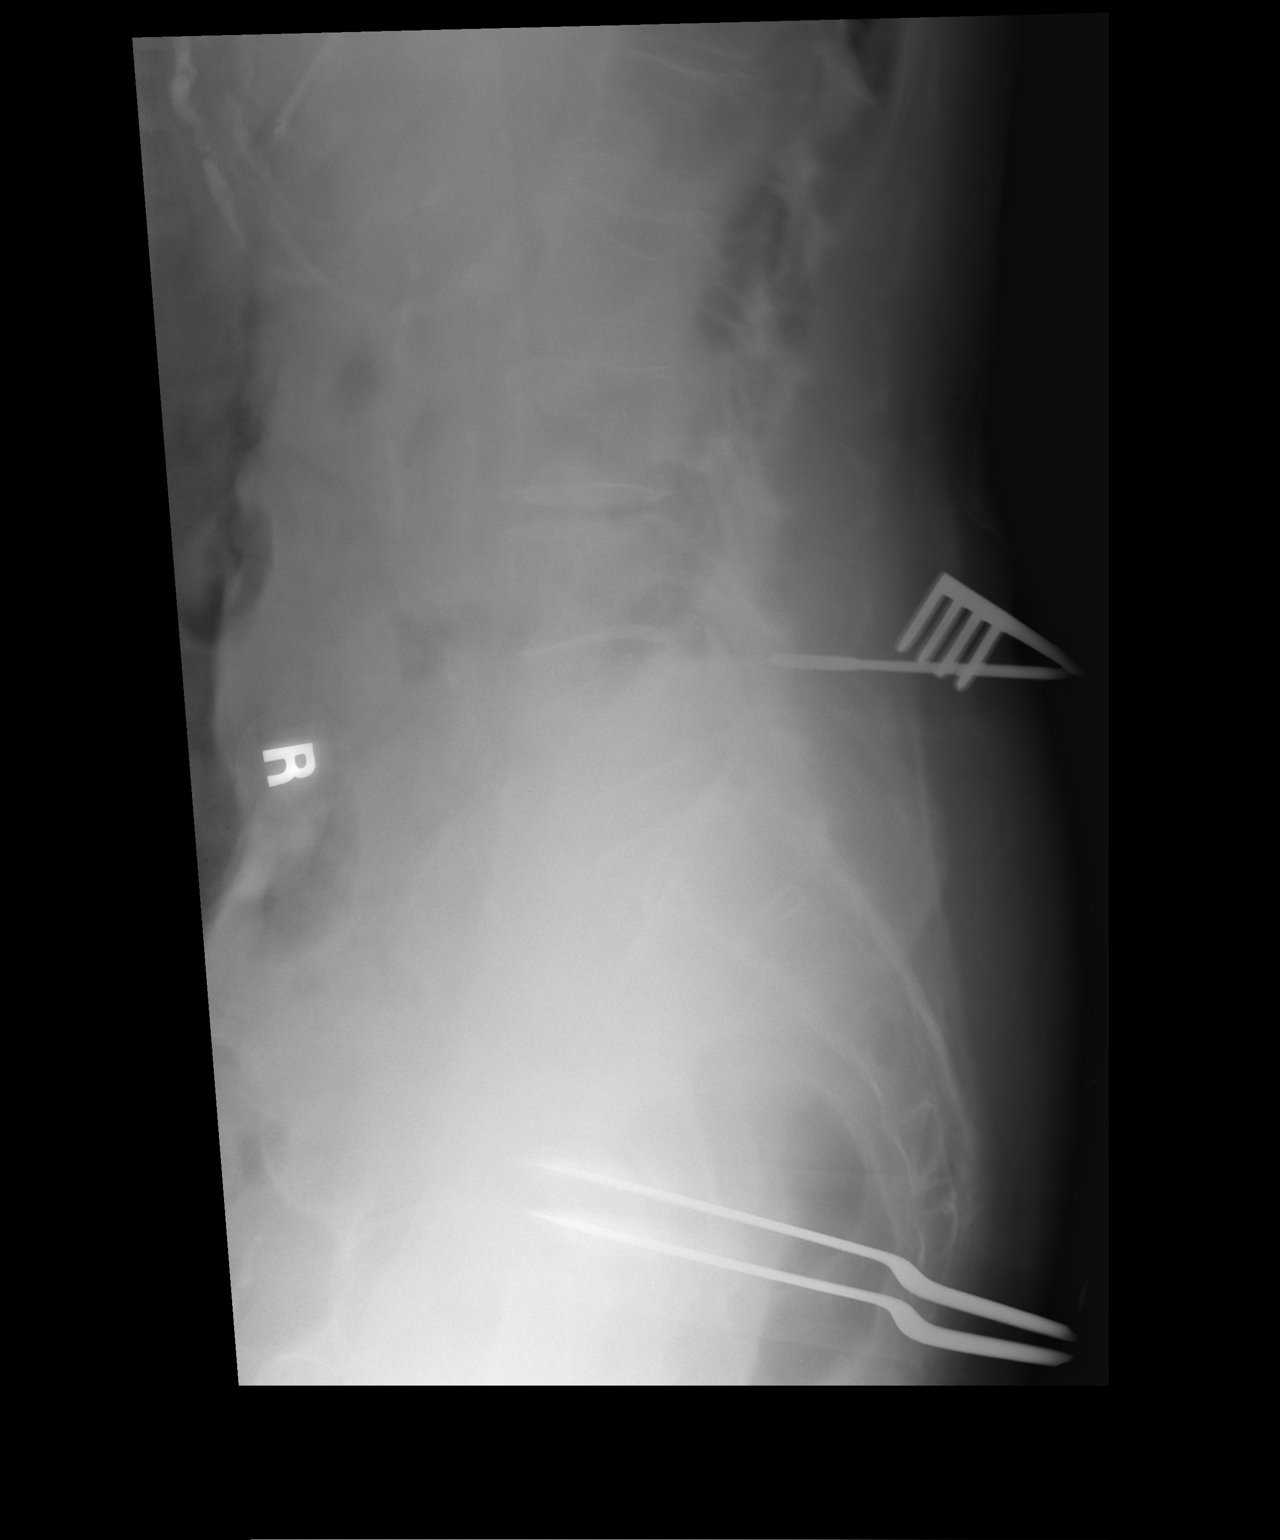

[1 of 1 positions shown; findings below may reference images not displayed]

FINDINGS: Overlying metallic marker is seen at the L4-L5 level based numbering
from prior radiograph of October 21, 2019. Disc height loss and facet
arthropathy most notable in the lower lumbar spine. Scattered
vascular calcifications are noted.
IMPRESSION: Metallic marker at the L4-L5 level

## 2021-03-15 DIAGNOSIS — N3091 Cystitis, unspecified with hematuria: Secondary | ICD-10-CM | POA: Diagnosis not present

## 2021-03-15 DIAGNOSIS — R300 Dysuria: Secondary | ICD-10-CM | POA: Diagnosis not present

## 2021-03-21 ENCOUNTER — Encounter: Payer: Self-pay | Admitting: Gastroenterology

## 2021-03-21 DIAGNOSIS — R300 Dysuria: Secondary | ICD-10-CM | POA: Diagnosis not present

## 2021-03-21 DIAGNOSIS — N3091 Cystitis, unspecified with hematuria: Secondary | ICD-10-CM | POA: Diagnosis not present

## 2021-03-28 DIAGNOSIS — N760 Acute vaginitis: Secondary | ICD-10-CM | POA: Diagnosis not present

## 2021-03-28 DIAGNOSIS — Z124 Encounter for screening for malignant neoplasm of cervix: Secondary | ICD-10-CM | POA: Diagnosis not present

## 2021-03-28 DIAGNOSIS — Z6832 Body mass index (BMI) 32.0-32.9, adult: Secondary | ICD-10-CM | POA: Diagnosis not present

## 2021-03-28 DIAGNOSIS — Z1231 Encounter for screening mammogram for malignant neoplasm of breast: Secondary | ICD-10-CM | POA: Diagnosis not present

## 2021-03-29 DIAGNOSIS — H353131 Nonexudative age-related macular degeneration, bilateral, early dry stage: Secondary | ICD-10-CM | POA: Diagnosis not present

## 2021-03-29 DIAGNOSIS — H5211 Myopia, right eye: Secondary | ICD-10-CM | POA: Diagnosis not present

## 2021-03-29 DIAGNOSIS — Z961 Presence of intraocular lens: Secondary | ICD-10-CM | POA: Diagnosis not present

## 2021-03-29 DIAGNOSIS — H524 Presbyopia: Secondary | ICD-10-CM | POA: Diagnosis not present

## 2021-04-06 DIAGNOSIS — E785 Hyperlipidemia, unspecified: Secondary | ICD-10-CM | POA: Diagnosis not present

## 2021-04-06 DIAGNOSIS — I10 Essential (primary) hypertension: Secondary | ICD-10-CM | POA: Diagnosis not present

## 2021-04-07 DIAGNOSIS — L659 Nonscarring hair loss, unspecified: Secondary | ICD-10-CM | POA: Diagnosis not present

## 2021-05-03 ENCOUNTER — Ambulatory Visit (HOSPITAL_COMMUNITY): Payer: PPO | Attending: Cardiology

## 2021-05-03 ENCOUNTER — Other Ambulatory Visit: Payer: Self-pay

## 2021-05-03 DIAGNOSIS — I350 Nonrheumatic aortic (valve) stenosis: Secondary | ICD-10-CM | POA: Insufficient documentation

## 2021-05-03 LAB — ECHOCARDIOGRAM COMPLETE
AR max vel: 0.95 cm2
AV Area VTI: 1.03 cm2
AV Area mean vel: 0.95 cm2
AV Mean grad: 23.3 mmHg
AV Peak grad: 43.2 mmHg
Ao pk vel: 3.29 m/s
Area-P 1/2: 2.04 cm2
P 1/2 time: 658 msec
S' Lateral: 2.35 cm

## 2021-05-06 DIAGNOSIS — B379 Candidiasis, unspecified: Secondary | ICD-10-CM | POA: Diagnosis not present

## 2021-05-06 DIAGNOSIS — I350 Nonrheumatic aortic (valve) stenosis: Secondary | ICD-10-CM | POA: Diagnosis not present

## 2021-05-06 DIAGNOSIS — N390 Urinary tract infection, site not specified: Secondary | ICD-10-CM | POA: Diagnosis not present

## 2021-05-06 DIAGNOSIS — Z8679 Personal history of other diseases of the circulatory system: Secondary | ICD-10-CM | POA: Diagnosis not present

## 2021-05-06 DIAGNOSIS — M25569 Pain in unspecified knee: Secondary | ICD-10-CM | POA: Diagnosis not present

## 2021-05-06 DIAGNOSIS — R4589 Other symptoms and signs involving emotional state: Secondary | ICD-10-CM | POA: Diagnosis not present

## 2021-05-06 DIAGNOSIS — E785 Hyperlipidemia, unspecified: Secondary | ICD-10-CM | POA: Diagnosis not present

## 2021-05-06 DIAGNOSIS — I10 Essential (primary) hypertension: Secondary | ICD-10-CM | POA: Diagnosis not present

## 2021-05-06 DIAGNOSIS — R300 Dysuria: Secondary | ICD-10-CM | POA: Diagnosis not present

## 2021-05-12 ENCOUNTER — Other Ambulatory Visit: Payer: Self-pay | Admitting: *Deleted

## 2021-05-12 DIAGNOSIS — R931 Abnormal findings on diagnostic imaging of heart and coronary circulation: Secondary | ICD-10-CM

## 2021-05-15 ENCOUNTER — Encounter: Payer: Self-pay | Admitting: Cardiology

## 2021-05-15 DIAGNOSIS — I10 Essential (primary) hypertension: Secondary | ICD-10-CM | POA: Insufficient documentation

## 2021-05-15 DIAGNOSIS — I2510 Atherosclerotic heart disease of native coronary artery without angina pectoris: Secondary | ICD-10-CM | POA: Insufficient documentation

## 2021-05-15 NOTE — Progress Notes (Signed)
Cardiology Office Note   Date:  05/16/2021   ID:  JAELYNN POZO, DOB [birth year], MRN [id number]  PCP:  Velna Hatchet, MD  Cardiologist:   None Referring:  Velna Hatchet, MD  Chief Complaint  Patient presents with   Heart Murmur       History of Present Illness: Tiffany Washington is a 80 y.o. female who presents for evaluation of coronary calcium.  This was noted to have coronary calcium on a CT.  This was in the LM and was three vessel.  The CT was done to evaluate abdominal pain.    She had a negative Lexiscan Myoview in 2019.    Follow up echo earlier this month demonstrated moderate AS.    She was hospitalized in June with a stroke.  She had a basal ganglia bleed.  I reviewed these records for this visit.  She was slightly hypertensive.  That was thought to be an etiology of this event.  She was taken off of aspirin.  She was put on lisinopril.  She was also started on Crestor.  She said she has had no residual because of this.  She gets around slowly with a cane because of mobility issues from having prior back problems.  However, she has been helping doing some house renovation and light chores.  She denies any chest pressure, neck or arm discomfort.  She has no new shortness of breath, PND or orthopnea.  She has had no weight gain or edema.   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     Ascorbic Acid (VITAMIN C) 1000 MG tablet Take 1,000 mg by mouth daily.     B Complex Vitamins (B COMPLEX 100 PO) Take 1 tablet by mouth daily.     lisinopril (ZESTRIL) 10 MG tablet Take 1 tablet (10 mg total) by mouth daily. 60 tablet 3   Multiple Vitamins-Minerals (MULTIVITAMIN WITH MINERALS) tablet Take 1 tablet by mouth daily.      pramipexole (MIRAPEX) 0.25 MG tablet Take 0.25 mg by mouth 2 (two) times daily.      rosuvastatin (CRESTOR) 20 MG tablet Take 1 tablet (20 mg total) by mouth daily. 30 tablet 3   Zinc 30 MG CAPS Take 30 mg by mouth daily.     No current facility-administered medications for this visit.    Allergies:   Carbamazepine, Ciprofloxacin, Diclofenac, Indomethacin, Meloxicam, Nortriptyline, Voltaren [diclofenac sodium], Gabapentin, Pamelor [nortriptyline hcl], Ropinirole, and Sulfa antibiotics    ROS:  Please see the history of present illness.   Otherwise, review of systems  are positive for decreased mobility.   All other systems are reviewed and negative.    PHYSICAL EXAM: VS:  BP 124/80   Pulse 69   Ht 5\' 2"  (1.575 m)   Wt 174 lb (78.9 kg)   SpO2 99%   BMI 31.83 kg/m  , BMI Body mass index is 31.83 kg/m. GENERAL:  Well appearing NECK:  No jugular venous distention, waveform within normal limits, carotid upstroke brisk and symmetric, no bruits, no thyromegaly LUNGS:  Clear to auscultation bilaterally CHEST:  Unremarkable HEART:  PMI not displaced or sustained,S1 and S2 within normal limits, no S3, no S4, no clicks, no rubs, 3 out of 6 harsh systolic murmur mid peaking and radiating at the aortic outflow tract and into the carotids, no diastolic murmurs ABD:  Flat, positive bowel sounds normal in frequency in pitch, no bruits, no rebound, no guarding, no midline pulsatile mass, no hepatomegaly, no splenomegaly EXT:   2 plus pulses throughout, no edema, no cyanosis no clubbing    EKG:  EKG is  not ordered today.    Recent Labs: 01/05/2021: ALT 33; Hemoglobin 13.9; Platelets 248 01/07/2021: BUN 17; Creatinine, Ser 0.88; Potassium 3.8; Sodium 137    Lipid Panel    Component Value Date/Time   CHOL 250 (H) 01/07/2021 0307   TRIG 252 (H) 01/07/2021 0307   HDL 41 01/07/2021 0307   CHOLHDL 6.1 01/07/2021 0307   VLDL 50 (H) 01/07/2021 0307   LDLCALC 159 (H) 01/07/2021 0307      Wt Readings from Last 3 Encounters:  05/16/21 174 lb (78.9 kg)  02/23/21 177 lb 9.6 oz (80.6 kg)  01/06/21 185 lb 3 oz (84 kg)      Other studies Reviewed: Additional studies/ records that were reviewed today include : Extensive review of hospital records with her recent CVA Review of the above records demonstrates: See elsewhere    ASSESSMENT AND PLAN:  CORONARY ARTERY CALCIFICATION:     She had a negative Lexiscan Myoview in 2019.  She has no new chest pain.  She will continue with risk reduction.  AS:  This was moderate on recent echo.  I will follow-up an echocardiogram in October of next year.  DYSLIPIDEMIA:   Her LDL was previously 145 she is on Crestor and she tells me that it is a fine and followed by her primary provider.  I will defer to his management.   HTN:   Her  BP is at target.  No change in therapy.   CVA:  No further work up or change in therapy.    Current medicines are reviewed at length with the patient today.  The patient does not have concerns regarding medicines.  The following changes have been made:  None  Labs/ tests ordered today include:   No orders of the defined types were placed in this encounter.    Disposition:   FU with me 6  months.    Signed, Minus Breeding, MD  05/16/2021 10:55 AM    [place]

## 2021-05-16 ENCOUNTER — Other Ambulatory Visit: Payer: Self-pay

## 2021-05-16 ENCOUNTER — Ambulatory Visit: Payer: PPO | Admitting: Cardiology

## 2021-05-16 ENCOUNTER — Encounter: Payer: Self-pay | Admitting: Cardiology

## 2021-05-16 VITALS — BP 124/80 | HR 69 | Ht 62.0 in | Wt 174.0 lb

## 2021-05-16 DIAGNOSIS — L570 Actinic keratosis: Secondary | ICD-10-CM | POA: Diagnosis not present

## 2021-05-16 DIAGNOSIS — E785 Hyperlipidemia, unspecified: Secondary | ICD-10-CM

## 2021-05-16 DIAGNOSIS — I350 Nonrheumatic aortic (valve) stenosis: Secondary | ICD-10-CM | POA: Diagnosis not present

## 2021-05-16 DIAGNOSIS — L821 Other seborrheic keratosis: Secondary | ICD-10-CM | POA: Diagnosis not present

## 2021-05-16 DIAGNOSIS — Z85820 Personal history of malignant melanoma of skin: Secondary | ICD-10-CM | POA: Diagnosis not present

## 2021-05-16 DIAGNOSIS — I2510 Atherosclerotic heart disease of native coronary artery without angina pectoris: Secondary | ICD-10-CM | POA: Diagnosis not present

## 2021-05-16 DIAGNOSIS — I10 Essential (primary) hypertension: Secondary | ICD-10-CM

## 2021-05-16 NOTE — Patient Instructions (Signed)
Medication Instructions:  Your physician recommends that you continue on your current medications as directed. Please refer to the Current Medication list given to you today.  *If you need a refill on your cardiac medications before your next appointment, please call your pharmacy*   Follow-Up: At [facility], you and your health needs are our priority.  As part of our continuing mission to provide you with exceptional heart care, we have created designated Provider Care Teams.  These Care Teams include your primary Cardiologist (physician) and Advanced Practice Providers (APPs -  Physician Assistants and Nurse Practitioners) who all work together to provide you with the care you need, when you need it.  We recommend signing up for the patient portal called "MyChart".  Sign up information is provided on this After Visit Summary.  MyChart is used to connect with patients for Virtual Visits (Telemedicine).  Patients are able to view lab/test results, encounter notes, upcoming appointments, etc.  Non-urgent messages can be sent to your provider as well.   To learn more about what you can do with MyChart, go to [email or web address].    Your next appointment:   6 month(s)  The format for your next appointment:   In Person  Provider:   James Hochrein, MD      

## 2021-05-16 NOTE — Addendum Note (Signed)
Addended by: Beatrix Fetters on: 05/16/2021 11:38 AM   Modules accepted: Orders

## 2021-06-01 ENCOUNTER — Ambulatory Visit: Payer: PPO | Admitting: Adult Health

## 2021-06-01 ENCOUNTER — Encounter: Payer: Self-pay | Admitting: Adult Health

## 2021-06-01 VITALS — BP 104/62 | HR 75 | Ht 64.0 in | Wt 173.0 lb

## 2021-06-01 DIAGNOSIS — E785 Hyperlipidemia, unspecified: Secondary | ICD-10-CM | POA: Diagnosis not present

## 2021-06-01 DIAGNOSIS — I10 Essential (primary) hypertension: Secondary | ICD-10-CM

## 2021-06-01 DIAGNOSIS — I610 Nontraumatic intracerebral hemorrhage in hemisphere, subcortical: Secondary | ICD-10-CM | POA: Diagnosis not present

## 2021-06-01 DIAGNOSIS — M792 Neuralgia and neuritis, unspecified: Secondary | ICD-10-CM | POA: Diagnosis not present

## 2021-06-01 NOTE — Patient Instructions (Addendum)
Continue Crestor 20 mg daily for secondary stroke prevention  Continue to follow up with PCP regarding cholesterol and blood pressure management  Maintain strict control of hypertension with blood pressure goal below 130/90 and cholesterol with LDL cholesterol (bad cholesterol) goal below 70 mg/dL.   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 I hope we have been able to provide you high quality care today.  You may receive a patient satisfaction survey over the next few weeks. We would appreciate your feedback and comments so that we may continue to improve ourselves and the health of our patients.

## 2021-06-01 NOTE — Progress Notes (Signed)
[facility] [address] [place]. [place] [address] [phone]       STROKE FOLLOW UP NOTE  Tiffany Washington Date of Birth:  1941/04/20 Medical Record Number:  [id number]   Reason for Referral: stroke follow up    SUBJECTIVE:   CHIEF COMPLAINT:  Chief Complaint  Patient presents with   Follow-up    Rm 2 with husband here for 3 month f/u pt reports- pt reports she has been doing well- has noticed neuropathy like symptoms in her feet since being d/c     HPI:   Update 06/01/2021 JM: Returns for 60-month stroke follow-up accompanied by her husband  Overall doing well since prior visit without new stroke/TIA symptoms Reports residual right facial itching sensation, right foot occasional dysesthesias (usually in evening) and right hand numbness - she believes this has been improving  Also reports occasional left foot nerve pain worse in the evening which wasn't present prior to her stroke - present on bottom of foot. Was told she likely has neuropathy Continue to use a cane in setting of low back issues -denies any recent falls  Compliant on Crestor 20 mg daily -denies side effects Blood pressure today 104/62 -routinely monitors at home typically stable 120s/70s  Routinely followed by cardiology Dr. Percival Spanish and PCP Dr. Ardeth Perfect   No further concerns at this time    History provided for reference purposes only Initial visit 02/23/2021 JM: Mr. Tiffany Washington is being seen for hospital follow-up accompanied by her husband. Reports right hand and foot numbness, right leg sensory impairment and right facial itching/burning sensation. Unsure of any improvement since discharge.  Uses cane for ambulation which she was using PTA and denies any recent falls. Blood pressure today 119/69 - monitors at home and has been stable. Plans on repeat lab work with PCP in the next couple of months.  She does have chronic history of RLS currently on Mirapex managed by PCP previously  tried multiple prior medications including gabapentin, nortriptyline, and Requip without benefit or intolerance.  No further concerns at this time.  Stroke admission 01/05/2021 Tiffany Washington is a 80 y.o. 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  Personally reviewed hospitalization pertinent progress notes, lab work and imaging summary provided.  Evaluated by Dr. 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  CTA head/neck negative for vascular malformation.  Carotid ultrasound bilateral 1 to 39% stenosis.  2D echo deferred as recently completed in March.  BP on admission 185/65 with no prior HTN history requiring Cleviprex gtt and transition to lisinopril 10 mg daily with adequate control and advised follow-up with PCP.  LDL 159.  A1c 6.0.  Initiated Crestor 20 mg daily at discharge and advised to discontinue aspirin which she was on PTA.   PT/OT no therapy needs and discharged home.       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 ([place])  Restless leg syndrome    Spondylolisthesis of lumbar region     PSH:  Past Surgical History:  Procedure Laterality Date   BACK SURGERY  10/2019   CESAREAN SECTION     COLONOSCOPY     COLONOSCOPY W/ BIOPSIES AND POLYPECTOMY     KNEE SURGERY Bilateral    Bilateral   MULTIPLE TOOTH EXTRACTIONS     POLYPECTOMY     UPPER GASTROINTESTINAL ENDOSCOPY     VAGINAL HYSTERECTOMY      Social  History:  Social History   Socioeconomic History   Marital status: Married    Spouse name: Karlton Lemon   Number of children: 2   Years of education: Not on file   Highest education level: High school graduate  Occupational History   Not on file  Tobacco Use   Smoking status: Never   Smokeless tobacco: Never  Vaping Use   Vaping Use: Never used  Substance and Sexual Activity   Alcohol use: No   Drug use: No   Sexual activity: Not on file  Other Topics Concern   Not on file  Social History Narrative   Working on a farm   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   She is married for 52 years and they have two children.   Social Determinants of Health   Financial Resource Strain: Not on file  Food Insecurity: Not on file  Transportation Needs: Not on file  Physical Activity: Not on file  Stress: Not on file  Social Connections: Not on file  Intimate Partner Violence: Not on file    Family History:  Family History  Problem Relation Age of Onset   Hypertension Mother    Congestive Heart Failure Mother        Died, 60   Heart failure Mother    Pulmonary embolism Father        Died, 81   Diabetes Sister    CAD Brother 38       CABG   Diabetes Brother    Gout Son     Medications:   Current Outpatient Medications on File Prior to Visit  Medication Sig Dispense Refill   acetaminophen (TYLENOL) 325 MG tablet Take 650 mg by mouth every 6 (six) hours as needed for moderate pain or headache.     Ascorbic Acid (VITAMIN C) 1000 MG tablet Take 1,000 mg by mouth daily.     B Complex Vitamins (B COMPLEX 100 PO) Take 1 tablet by mouth daily.     lisinopril (ZESTRIL) 10 MG tablet Take 1 tablet (10 mg total) by mouth daily. 60 tablet 3   Multiple Vitamins-Minerals (MULTIVITAMIN WITH MINERALS) tablet Take 1 tablet by mouth daily.      pramipexole (MIRAPEX) 0.25 MG tablet Take 0.25 mg by mouth 2 (two) times daily.      rosuvastatin (CRESTOR) 20 MG tablet Take 1 tablet (20 mg  total) by mouth daily. 30 tablet 3   Zinc 30 MG CAPS Take 30 mg by mouth daily.     No current facility-administered medications on file prior to visit.    Allergies:   Allergies  Allergen Reactions   Carbamazepine Other (See Comments)    Doesn't remember reaction   Ciprofloxacin Other (See Comments)    Patient stated she "cannot take this"   Diclofenac Other (See Comments)    Reaction not recalled   Indomethacin Other (See Comments)    Reaction not recalled   Meloxicam Other (See Comments)  Reaction not recalled   Nortriptyline Other (See Comments)    Reaction not recalled   Voltaren [Diclofenac Sodium] Other (See Comments)    Reaction not recalled   Gabapentin Other (See Comments)    Makes the patient unable to sleep   Pamelor [Nortriptyline Hcl] Other (See Comments)    insomnia   Ropinirole Other (See Comments)    Speed up the restless legs   Sulfa Antibiotics Diarrhea and Nausea And Vomiting      OBJECTIVE:  Physical Exam  Vitals:   06/01/21 0941  BP: 104/62  Pulse: 75  SpO2: 96%  Weight: 173 lb (78.5 kg)  Height: 5\' 4"  (1.626 m)    Body mass index is 29.7 kg/m. No results found.   General: well developed, well nourished, very pleasant elderly Caucasian female, seated, in no evident distress Head: head normocephalic and atraumatic.   Neck: supple with no carotid or supraclavicular bruits Cardiovascular: regular rate and rhythm Musculoskeletal: no deformity Skin:  no rash/petichiae Vascular:  Normal pulses all extremities   Neurologic Exam Mental Status: Awake and fully alert.  Fluent speech and language.  Oriented to place and time. Recent and remote memory intact. Attention span, concentration and fund of knowledge appropriate. Mood and affect appropriate.  Cranial Nerves: Pupils equal, briskly reactive to light. Extraocular movements full without nystagmus. Visual fields full to confrontation. Hearing intact. Facial sensation intact. Face, tongue,  palate moves normally and symmetrically.  Motor: Normal bulk and tone. Normal strength in all tested extremity muscles Sensory.:  Decreased light touch and vibratory sensation R>L LE distally  Coordination: Rapid alternating movements normal in all extremities. Finger-to-nose and heel-to-shin performed accurately bilaterally. Gait and Station: Arises from chair without difficulty. Stance is slightly hunched. Gait demonstrates normal stride length and balance with use of cane.  Reflexes: 1+ and symmetric. Toes downgoing.         ASSESSMENT: Tiffany Washington is a 80 y.o. 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 Vascular risk factors include new diagnosis of HTN, HLD, and aortic stenosis.     PLAN:  L BG ICH :  Residual deficit: Right hemisensory impairment with occasional dysesthesias. Discussed overstimulation techniques and use of compression devices which may help with dysesthesias. Not interested in medication management - hx of prior multiple medication intolerances Continue Crestor 20 mg daily for secondary stroke prevention.   No indication for antithrombotic at this present time as no prior stroke hx -advised to further discuss with cardiology for possible need of aspirin from their standpoint Discussed secondary stroke prevention measures and importance of close PCP follow up for aggressive stroke risk factor management. I have gone over the pathophysiology of stroke, warning signs and symptoms, risk factors and their management in some detail with instructions to go to the closest emergency room for symptoms of concern. HTN: BP goal <130/90. Stable although on lower side today on lisinopril 10 mg daily per PCP.  Discussed importance of increasing water intake and routinely monitoring at home HLD: LDL goal <70. 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 Likely multifactorial in setting of known chronic lower back issues, pre-DM (prior A1c 6.0) and extensive family history of neuropathy. Offered obtaining lab work today to rule out reversible causes but plans on f/u with PCP in 2 months and will further discuss at that visit (mainly would obtain B12, TSH and  A1c). No indication for EMG/NCV at this time as current treatment plan would not change. Recommend continued monitoring. Advised if any worsening to f/u with PCP or prior neurosurgeon for further evaluation     Per patient request, f/u as needed    CC:  PCP: Velna Hatchet, MD    I spent 39 minutes of face-to-face and non-face-to-face time with patient and husband.  This included previsit chart review, lab review, study review, electronic health record documentation, patient and husband education regarding prior stroke including etiology, secondary stroke prevention measures and aggressive stroke risk factor management, residual deficits and typical recovery time, left foot neuropathy and possible etiologies and answered all questions to patient and husband's satisfaction  Frann Rider, AGNP-BC  [facility] [address] [place], [place] [address]  Phone [phone] Fax [phone] Note: This document was prepared with digital dictation and possible smart phrase technology. Any transcriptional errors that result from this process are unintentional.

## 2021-08-10 DIAGNOSIS — E559 Vitamin D deficiency, unspecified: Secondary | ICD-10-CM | POA: Diagnosis not present

## 2021-08-10 DIAGNOSIS — E785 Hyperlipidemia, unspecified: Secondary | ICD-10-CM | POA: Diagnosis not present

## 2021-08-10 DIAGNOSIS — R7989 Other specified abnormal findings of blood chemistry: Secondary | ICD-10-CM | POA: Diagnosis not present

## 2021-08-10 DIAGNOSIS — I10 Essential (primary) hypertension: Secondary | ICD-10-CM | POA: Diagnosis not present

## 2021-08-17 DIAGNOSIS — K219 Gastro-esophageal reflux disease without esophagitis: Secondary | ICD-10-CM | POA: Diagnosis not present

## 2021-08-17 DIAGNOSIS — I350 Nonrheumatic aortic (valve) stenosis: Secondary | ICD-10-CM | POA: Diagnosis not present

## 2021-08-17 DIAGNOSIS — Z8679 Personal history of other diseases of the circulatory system: Secondary | ICD-10-CM | POA: Diagnosis not present

## 2021-08-17 DIAGNOSIS — Z1339 Encounter for screening examination for other mental health and behavioral disorders: Secondary | ICD-10-CM | POA: Diagnosis not present

## 2021-08-17 DIAGNOSIS — Z1331 Encounter for screening for depression: Secondary | ICD-10-CM | POA: Diagnosis not present

## 2021-08-17 DIAGNOSIS — E785 Hyperlipidemia, unspecified: Secondary | ICD-10-CM | POA: Diagnosis not present

## 2021-08-17 DIAGNOSIS — I10 Essential (primary) hypertension: Secondary | ICD-10-CM | POA: Diagnosis not present

## 2021-08-17 DIAGNOSIS — I2510 Atherosclerotic heart disease of native coronary artery without angina pectoris: Secondary | ICD-10-CM | POA: Diagnosis not present

## 2021-08-17 DIAGNOSIS — R002 Palpitations: Secondary | ICD-10-CM | POA: Diagnosis not present

## 2021-08-17 DIAGNOSIS — R197 Diarrhea, unspecified: Secondary | ICD-10-CM | POA: Diagnosis not present

## 2021-08-17 DIAGNOSIS — Z0000 Encounter for general adult medical examination without abnormal findings: Secondary | ICD-10-CM | POA: Diagnosis not present

## 2021-11-29 DIAGNOSIS — M25562 Pain in left knee: Secondary | ICD-10-CM | POA: Diagnosis not present

## 2021-11-29 DIAGNOSIS — I10 Essential (primary) hypertension: Secondary | ICD-10-CM | POA: Diagnosis not present

## 2021-11-29 DIAGNOSIS — I2510 Atherosclerotic heart disease of native coronary artery without angina pectoris: Secondary | ICD-10-CM | POA: Diagnosis not present

## 2021-11-29 DIAGNOSIS — E785 Hyperlipidemia, unspecified: Secondary | ICD-10-CM | POA: Diagnosis not present

## 2021-11-29 DIAGNOSIS — M25552 Pain in left hip: Secondary | ICD-10-CM | POA: Diagnosis not present

## 2021-11-29 DIAGNOSIS — R5383 Other fatigue: Secondary | ICD-10-CM | POA: Diagnosis not present

## 2021-11-29 DIAGNOSIS — M791 Myalgia, unspecified site: Secondary | ICD-10-CM | POA: Diagnosis not present

## 2021-11-29 DIAGNOSIS — I350 Nonrheumatic aortic (valve) stenosis: Secondary | ICD-10-CM | POA: Diagnosis not present

## 2021-11-29 DIAGNOSIS — M797 Fibromyalgia: Secondary | ICD-10-CM | POA: Diagnosis not present

## 2021-12-04 ENCOUNTER — Inpatient Hospital Stay (HOSPITAL_COMMUNITY)
Admission: EM | Admit: 2021-12-04 | Discharge: 2021-12-08 | DRG: 286 | Disposition: A | Discharge status: Home / Self Care | Payer: PPO | Attending: Internal Medicine | Admitting: Internal Medicine

## 2021-12-04 ENCOUNTER — Encounter (HOSPITAL_COMMUNITY): Payer: Self-pay | Admitting: Emergency Medicine

## 2021-12-04 ENCOUNTER — Other Ambulatory Visit: Payer: Self-pay

## 2021-12-04 ENCOUNTER — Emergency Department (HOSPITAL_COMMUNITY): Payer: PPO

## 2021-12-04 DIAGNOSIS — Z882 Allergy status to sulfonamides status: Secondary | ICD-10-CM | POA: Diagnosis not present

## 2021-12-04 DIAGNOSIS — I110 Hypertensive heart disease with heart failure: Secondary | ICD-10-CM | POA: Diagnosis not present

## 2021-12-04 DIAGNOSIS — I080 Rheumatic disorders of both mitral and aortic valves: Secondary | ICD-10-CM | POA: Diagnosis present

## 2021-12-04 DIAGNOSIS — I5021 Acute systolic (congestive) heart failure: Secondary | ICD-10-CM | POA: Diagnosis not present

## 2021-12-04 DIAGNOSIS — E876 Hypokalemia: Secondary | ICD-10-CM | POA: Diagnosis not present

## 2021-12-04 DIAGNOSIS — J9811 Atelectasis: Secondary | ICD-10-CM | POA: Diagnosis not present

## 2021-12-04 DIAGNOSIS — N281 Cyst of kidney, acquired: Secondary | ICD-10-CM | POA: Diagnosis not present

## 2021-12-04 DIAGNOSIS — Z79899 Other long term (current) drug therapy: Secondary | ICD-10-CM | POA: Diagnosis not present

## 2021-12-04 DIAGNOSIS — I10 Essential (primary) hypertension: Secondary | ICD-10-CM | POA: Diagnosis present

## 2021-12-04 DIAGNOSIS — Z85820 Personal history of malignant melanoma of skin: Secondary | ICD-10-CM

## 2021-12-04 DIAGNOSIS — G2581 Restless legs syndrome: Secondary | ICD-10-CM | POA: Diagnosis present

## 2021-12-04 DIAGNOSIS — J9601 Acute respiratory failure with hypoxia: Secondary | ICD-10-CM | POA: Diagnosis not present

## 2021-12-04 DIAGNOSIS — I471 Supraventricular tachycardia: Secondary | ICD-10-CM | POA: Diagnosis not present

## 2021-12-04 DIAGNOSIS — I639 Cerebral infarction, unspecified: Secondary | ICD-10-CM | POA: Diagnosis present

## 2021-12-04 DIAGNOSIS — Z833 Family history of diabetes mellitus: Secondary | ICD-10-CM

## 2021-12-04 DIAGNOSIS — H9190 Unspecified hearing loss, unspecified ear: Secondary | ICD-10-CM | POA: Diagnosis present

## 2021-12-04 DIAGNOSIS — I5031 Acute diastolic (congestive) heart failure: Secondary | ICD-10-CM | POA: Diagnosis not present

## 2021-12-04 DIAGNOSIS — Z8673 Personal history of transient ischemic attack (TIA), and cerebral infarction without residual deficits: Secondary | ICD-10-CM

## 2021-12-04 DIAGNOSIS — J90 Pleural effusion, not elsewhere classified: Secondary | ICD-10-CM | POA: Diagnosis not present

## 2021-12-04 DIAGNOSIS — Z888 Allergy status to other drugs, medicaments and biological substances status: Secondary | ICD-10-CM | POA: Diagnosis not present

## 2021-12-04 DIAGNOSIS — Z01818 Encounter for other preprocedural examination: Secondary | ICD-10-CM | POA: Diagnosis not present

## 2021-12-04 DIAGNOSIS — K219 Gastro-esophageal reflux disease without esophagitis: Secondary | ICD-10-CM | POA: Diagnosis not present

## 2021-12-04 DIAGNOSIS — R739 Hyperglycemia, unspecified: Secondary | ICD-10-CM | POA: Diagnosis not present

## 2021-12-04 DIAGNOSIS — I2722 Pulmonary hypertension due to left heart disease: Secondary | ICD-10-CM | POA: Diagnosis not present

## 2021-12-04 DIAGNOSIS — D649 Anemia, unspecified: Secondary | ICD-10-CM | POA: Diagnosis not present

## 2021-12-04 DIAGNOSIS — E785 Hyperlipidemia, unspecified: Secondary | ICD-10-CM | POA: Diagnosis present

## 2021-12-04 DIAGNOSIS — J811 Chronic pulmonary edema: Secondary | ICD-10-CM | POA: Diagnosis not present

## 2021-12-04 DIAGNOSIS — I2510 Atherosclerotic heart disease of native coronary artery without angina pectoris: Secondary | ICD-10-CM | POA: Diagnosis present

## 2021-12-04 DIAGNOSIS — I509 Heart failure, unspecified: Secondary | ICD-10-CM | POA: Diagnosis not present

## 2021-12-04 DIAGNOSIS — I340 Nonrheumatic mitral (valve) insufficiency: Secondary | ICD-10-CM | POA: Diagnosis not present

## 2021-12-04 DIAGNOSIS — I700 Atherosclerosis of aorta: Secondary | ICD-10-CM | POA: Diagnosis not present

## 2021-12-04 DIAGNOSIS — I350 Nonrheumatic aortic (valve) stenosis: Secondary | ICD-10-CM | POA: Diagnosis not present

## 2021-12-04 DIAGNOSIS — R0602 Shortness of breath: Secondary | ICD-10-CM | POA: Diagnosis not present

## 2021-12-04 DIAGNOSIS — Z8249 Family history of ischemic heart disease and other diseases of the circulatory system: Secondary | ICD-10-CM | POA: Diagnosis not present

## 2021-12-04 DIAGNOSIS — R918 Other nonspecific abnormal finding of lung field: Secondary | ICD-10-CM | POA: Diagnosis not present

## 2021-12-04 HISTORY — DX: Nonrheumatic mitral (valve) insufficiency: I34.0

## 2021-12-04 HISTORY — DX: Essential (primary) hypertension: I10

## 2021-12-04 HISTORY — DX: Nonrheumatic aortic (valve) insufficiency: I35.1

## 2021-12-04 HISTORY — DX: Nontraumatic intracerebral hemorrhage, unspecified: I61.9

## 2021-12-04 HISTORY — DX: Disorder of arteries and arterioles, unspecified: I77.9

## 2021-12-04 LAB — BASIC METABOLIC PANEL
Anion gap: 8 (ref 5–15)
BUN: 12 mg/dL (ref 8–23)
CO2: 21 mmol/L — ABNORMAL LOW (ref 22–32)
Calcium: 9.2 mg/dL (ref 8.9–10.3)
Chloride: 108 mmol/L (ref 98–111)
Creatinine, Ser: 0.72 mg/dL (ref 0.44–1.00)
GFR, Estimated: >60 mL/min (ref >60)
Glucose, Bld: 104 mg/dL — ABNORMAL HIGH (ref 70–99)
Potassium: 3.4 mmol/L — ABNORMAL LOW (ref 3.5–5.1)
Sodium: 137 mmol/L (ref 135–145)

## 2021-12-04 LAB — CBC
HCT: 37.2 % (ref 36.0–46.0)
Hemoglobin: 12.6 g/dL (ref 12.0–15.0)
MCH: 32.5 pg (ref 26.0–34.0)
MCHC: 33.9 g/dL (ref 30.0–36.0)
MCV: 95.9 fL (ref 80.0–100.0)
Platelets: 246 10*3/uL (ref 150–400)
RBC: 3.88 MIL/uL (ref 3.87–5.11)
RDW: 13.2 % (ref 11.5–15.5)
WBC: 10.8 10*3/uL — ABNORMAL HIGH (ref 4.0–10.5)
nRBC: 0 % (ref 0.0–0.2)

## 2021-12-04 LAB — MAGNESIUM: Magnesium: 2 mg/dL (ref 1.7–2.4)

## 2021-12-04 LAB — TROPONIN I (HIGH SENSITIVITY)
Troponin I (High Sensitivity): 14 ng/L (ref <18)
Troponin I (High Sensitivity): 17 ng/L (ref <18)

## 2021-12-04 LAB — BRAIN NATRIURETIC PEPTIDE: B Natriuretic Peptide: 306.9 pg/mL — ABNORMAL HIGH (ref 0.0–100.0)

## 2021-12-04 MED ORDER — PRAMIPEXOLE DIHYDROCHLORIDE 0.25 MG PO TABS: 0.2500 mg | ORAL_TABLET | Freq: Two times a day (BID) | ORAL | Status: DC

## 2021-12-04 MED ORDER — ROSUVASTATIN CALCIUM 20 MG PO TABS: 20.0000 mg | ORAL_TABLET | Freq: Every day | ORAL | Status: DC

## 2021-12-04 MED ORDER — FUROSEMIDE 10 MG/ML IJ SOLN
40.0000 mg | Freq: Every day | INTRAMUSCULAR | Status: DC
  Administered 2021-12-05: 40 mg via INTRAVENOUS
  Filled 2021-12-04: qty 4

## 2021-12-04 MED ORDER — ROSUVASTATIN CALCIUM 20 MG PO TABS
20.0000 mg | ORAL_TABLET | Freq: Every day | ORAL | Status: DC
  Administered 2021-12-04 – 2021-12-05 (×2): 20 mg via ORAL
  Filled 2021-12-04 (×2): qty 1

## 2021-12-04 MED ORDER — ENOXAPARIN SODIUM 40 MG/0.4ML IJ SOSY
40.0000 mg | PREFILLED_SYRINGE | INTRAMUSCULAR | Status: DC
  Administered 2021-12-05 – 2021-12-06 (×2): 40 mg via SUBCUTANEOUS
  Filled 2021-12-04 (×2): qty 0.4

## 2021-12-04 MED ORDER — PRAMIPEXOLE DIHYDROCHLORIDE 0.25 MG PO TABS
0.2500 mg | ORAL_TABLET | ORAL | Status: DC
  Administered 2021-12-04 – 2021-12-07 (×7): 0.25 mg via ORAL
  Filled 2021-12-04 (×8): qty 1

## 2021-12-04 MED ORDER — POTASSIUM CHLORIDE CRYS ER 20 MEQ PO TBCR
40.0000 meq | EXTENDED_RELEASE_TABLET | Freq: Once | ORAL | Status: AC
Start: 2021-12-04 — End: 2021-12-04
  Administered 2021-12-04: 40 meq via ORAL
  Filled 2021-12-04: qty 2

## 2021-12-04 MED ORDER — POTASSIUM CHLORIDE CRYS ER 20 MEQ PO TBCR
40.0000 meq | EXTENDED_RELEASE_TABLET | Freq: Once | ORAL | Status: AC
  Administered 2021-12-05: 40 meq via ORAL
  Filled 2021-12-04: qty 2

## 2021-12-04 MED ORDER — FUROSEMIDE 10 MG/ML IJ SOLN
60.0000 mg | Freq: Once | INTRAMUSCULAR | Status: AC
Start: 2021-12-04 — End: 2021-12-04
  Administered 2021-12-04: 60 mg via INTRAVENOUS
  Filled 2021-12-04: qty 6

## 2021-12-04 MED ORDER — IPRATROPIUM-ALBUTEROL 0.5-2.5 (3) MG/3ML IN SOLN
3.0000 mL | Freq: Once | RESPIRATORY_TRACT | Status: AC
  Administered 2021-12-04: 3 mL via RESPIRATORY_TRACT
  Filled 2021-12-04: qty 3

## 2021-12-04 NOTE — Assessment & Plan Note (Addendum)
Controlled. Continue ACE-I.  ?

## 2021-12-04 NOTE — ED Triage Notes (Signed)
Pt reports SOB x 2-3 days.  Coughing at night.  Edema to lower extremities and abd.  Denies pain but states she occasionally has pain all over.  Breathing labored. ?

## 2021-12-04 NOTE — Assessment & Plan Note (Signed)
Continue statin. 

## 2021-12-04 NOTE — Progress Notes (Deleted)
Cardiology Office Note   Date:  12/04/2021   ID:  Tiffany Washington, DOB [birth year], MRN [id number]  PCP:  Velna Hatchet, MD  Cardiologist:   None Referring:  Velna Hatchet, MD  No chief complaint on file.      History of Present Illness: Tiffany Washington is a 81 y.o. female who presents for evaluation of coronary calcium.  This was noted to have coronary calcium on a CT.  This was in the LM and was three vessel.  The CT was done to evaluate abdominal pain.    She had a negative Lexiscan Myoview in 2019.    Follow up echo earlier this month demonstrated moderate AS.    She was hospitalized in June 2022 with a stroke.  She had a basal ganglia bleed.  I reviewed these records for this visit.  She was slightly hypertensive.  That was thought to be an etiology of this event.  ***   ***  She was taken off of aspirin.  She was put on lisinopril.  She was also started on Crestor.  She said she has had no residual because of this.  She gets around slowly with a cane because of mobility issues from having prior back problems.  However, she has been helping doing some house renovation and light chores.  She denies any chest pressure, neck or arm discomfort.  She has no new shortness of breath, PND or orthopnea.  She has had no weight gain or edema.   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     Ascorbic Acid (VITAMIN C) 1000 MG tablet Take 1,000 mg by mouth daily.     B Complex Vitamins (B COMPLEX 100 PO) Take 1 tablet by mouth daily.     lisinopril (ZESTRIL) 10 MG tablet Take 1 tablet (10 mg total) by mouth daily. 60 tablet 3   Multiple Vitamins-Minerals (MULTIVITAMIN WITH MINERALS) tablet Take 1 tablet by mouth daily.      pramipexole (MIRAPEX) 0.25 MG tablet Take 0.25 mg by mouth 2 (two) times daily.      rosuvastatin (CRESTOR) 20 MG tablet Take 1 tablet (20 mg total) by mouth daily. 30 tablet 3   Zinc 30 MG CAPS Take 30 mg by mouth daily.     No current facility-administered medications for this visit.    Allergies:   Carbamazepine, Ciprofloxacin, Diclofenac, Indomethacin, Meloxicam, Nortriptyline, Voltaren [diclofenac sodium], Gabapentin, Pamelor [nortriptyline hcl], Ropinirole, and Sulfa antibiotics    ROS:  Please see the history of present illness.   Otherwise, review of systems  are positive for ***.   All other systems are reviewed and negative.    PHYSICAL EXAM: VS:  There were no vitals taken for this visit. , BMI There is no height or weight on file to calculate BMI. GENERAL:  Well appearing NECK:  No jugular venous distention, waveform within normal limits, carotid upstroke brisk and symmetric, no bruits, no thyromegaly LUNGS:  Clear to auscultation bilaterally CHEST:  Unremarkable HEART:  PMI not displaced or sustained,S1 and S2 within normal limits, no S3, no S4, no clicks, no rubs, *** murmurs ABD:  Flat, positive bowel sounds normal in frequency in pitch, no bruits, no rebound, no guarding, no midline pulsatile mass, no hepatomegaly, no splenomegaly EXT:  2 plus pulses throughout, no edema, no cyanosis no clubbing     ***GENERAL:  Well appearing NECK:  No jugular venous distention, waveform within normal limits, carotid  upstroke brisk and symmetric, no bruits, no thyromegaly LUNGS:  Clear to auscultation bilaterally CHEST:  Unremarkable HEART:  PMI not displaced or sustained,S1 and S2 within normal limits, no S3, no S4, no clicks, no rubs, 3 out of 6 harsh systolic murmur mid peaking and radiating at the aortic outflow tract and into the carotids, no diastolic murmurs ABD:  Flat, positive bowel sounds normal in frequency in pitch, no bruits, no rebound, no guarding, no midline pulsatile mass, no hepatomegaly, no splenomegaly EXT:  2 plus pulses throughout, no edema, no cyanosis no clubbing    EKG:  EKG is *** ordered today. ***   Recent Labs: 01/05/2021: ALT 33; Hemoglobin 13.9; Platelets 248 01/07/2021: BUN 17; Creatinine, Ser 0.88; Potassium 3.8; Sodium 137    Lipid Panel    Component Value Date/Time   CHOL 250 (H) 01/07/2021 0307   TRIG 252 (H) 01/07/2021 0307   HDL 41 01/07/2021 0307   CHOLHDL 6.1 01/07/2021 0307   VLDL 50 (H) 01/07/2021 0307   LDLCALC 159 (H) 01/07/2021 0307      Wt Readings from Last 3 Encounters:  06/01/21 173 lb (78.5 kg)  05/16/21 174 lb (78.9 kg)  02/23/21 177 lb 9.6 oz (80.6 kg)      Other studies Reviewed: Additional studies/ records that were reviewed today include : *** Review of the above records demonstrates: See elsewhere    ASSESSMENT AND PLAN:  CORONARY ARTERY CALCIFICATION:     She had a negative Lexiscan Myoview in 2019.   **  She has no new chest pain.  She will continue with risk reduction.  AS:  This was moderate on echo in October 2022.   ***  .  I will follow-up an echocardiogram in October of next year.  DYSLIPIDEMIA:   Her LDL was previously ***  145 she is on Crestor and she tells me that it is a fine and followed by her primary provider.  I will defer to his management.   HTN:   Her  BP is *** at target.  No change in therapy.   CVA: ***  Current medicines are reviewed at length with the patient today.  The patient does not have  concerns regarding medicines.  The following changes have been made:  None  Labs/ tests ordered today include:   No orders of the defined types were placed in this encounter.     Disposition:   FU with me 6  months.    Signed, Minus Breeding, MD  12/04/2021 3:11 PM    [facility]

## 2021-12-04 NOTE — Assessment & Plan Note (Signed)
Stable.  Had negative Lexiscan Myoview in 2019. ?

## 2021-12-04 NOTE — H&P (Addendum)
?History and Physical  ? ? ?Patient: Tiffany Washington OEV:[id number] DOB: September 30, 1940 ?DOA: 12/04/2021 ?DOS: the patient was seen and examined on 12/04/2021 ?PCP: Velna Hatchet, MD  ?Patient coming from: Home ? ?Chief Complaint:  ?Chief Complaint  ?Patient presents with  ? Shortness of Breath  ? ?HPI: Tiffany Washington is a 81 y.o. female with medical history significant of left basal ganglia hemorrhage CAD, moderate aortic stenosis, HTN, HLD, GERD who presents with acute increasing shortness of breath. ? ?She reports that 3 days ago she began to note increasing shortness of breath both at rest and worse with ambulation.  Also requiring multiple pillows to sleep at night.  Notes bilateral lower extremity edema worse on the right.  Has nonproductive cough.  Denies any chest pain.  No GI symptoms including nausea, vomiting or diarrhea.  No fever.  No tobacco use.  Reports compliant with her medications. ?Patient requires a cane for ambulation due to chronic back issues but she is still able to work on her garden and walk around at home.  She has noticed a gradual physical decline in the past several months.  She had an annual physical in January and was told that she had some wheezing in her lungs at that time. ? ?In the ED, she was afebrile normotensive.  Tachypneic and hypoxic down to 87% at rest requiring up to 2 L via nasal cannula.  BNP of 307.  Troponin reassuring at 14.  Chest x-ray shows pulmonary vascular congestion/edema and small pleural effusion.  EKG on my review shows normal sinus rhythm with nonspecific T wave changes.  Has mild leukocytosis of 10.8.  No anemia.  Mild hypokalemia of 3.4 but no other significant electrolyte abnormalities. ? ?She was given DuoNeb and IV 60 mg Lasix and 90mq of potassium.  Hospitalist on-call for admission. ? ? ?Review of Systems: As mentioned in the history of present illness. All other systems reviewed and are negative. ?Past Medical History:  ?Diagnosis Date  ? Aortic  stenosis   ? Mild  ? Arthritis   ? GERD (gastroesophageal reflux disease)   ? mild  ? HOH (hard of hearing)   ? 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 Restless leg syndrome   ? Spondylolisthesis of lumbar region   ? ?Past Surgical History:  ?Procedure Laterality Date  ? BACK SURGERY  10/2019  ? CESAREAN SECTION    ? COLONOSCOPY    ? COLONOSCOPY W/ BIOPSIES AND POLYPECTOMY    ? KNEE SURGERY Bilateral   ? Bilateral  ? MULTIPLE TOOTH EXTRACTIONS    ? POLYPECTOMY    ? UPPER GASTROINTESTINAL ENDOSCOPY    ? VAGINAL HYSTERECTOMY    ? ?Social History:  reports that she has never smoked. She has never used smokeless tobacco. She reports that she does not drink alcohol and does not use drugs. 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  ? ?Allergies  ?Allergen Reactions  ? Carbamazepine Other (See Comments)  ?  Doesn't remember reaction  ? Ciprofloxacin Other (See Comments)  ?  Patient stated she "cannot take this"  ? Diclofenac Other (See Comments)  ?  Reaction not recalled  ? Indomethacin Other (See Comments)  ?  Reaction not recalled  ? Meloxicam Other (See Comments)  ?  Reaction not recalled  ? Nortriptyline Other (See Comments)  ?  Reaction not recalled  ? Voltaren [Diclofenac Sodium] Other (See Comments)  ?  Reaction  not recalled  ? Gabapentin Other (See Comments)  ?  Makes the patient unable to sleep  ? Pamelor [Nortriptyline Hcl] Other (See Comments)  ?  insomnia  ? Ropinirole Other (See Comments)  ?  Speed up the restless legs  ? Sulfa Antibiotics Diarrhea and Nausea And Vomiting  ? ? ?Family History  ?Problem Relation Age of Onset  ? Hypertension Mother   ? Congestive Heart Failure Mother   ?     Died, 68  ? Heart failure Mother   ? Pulmonary embolism Father   ?     Died, 81  ? Diabetes Sister   ? CAD Brother 42  ?     CABG  ? Diabetes Brother   ? Gout Son   ? ? ?Prior to Admission medications   ?Medication Sig Start Date End Date Taking? Authorizing Provider   ?acetaminophen (TYLENOL) 325 MG tablet Take 650 mg by mouth every 6 (six) hours as needed for moderate pain or headache.    [provider]  ?Ascorbic Acid (VITAMIN C) 1000 MG tablet Take 1,000 mg by mouth daily.    [provider]  ?B Complex Vitamins (B COMPLEX 100 PO) Take 1 tablet by mouth daily.    [provider]  ?lisinopril (ZESTRIL) 10 MG tablet Take 1 tablet (10 mg total) by mouth daily. 01/08/21   Einar Pheasant, NP  ?Multiple Vitamins-Minerals (MULTIVITAMIN WITH MINERALS) tablet Take 1 tablet by mouth daily.     [provider]  ?pramipexole (MIRAPEX) 0.25 MG tablet Take 0.25 mg by mouth 2 (two) times daily.     [provider]  ?rosuvastatin (CRESTOR) 20 MG tablet Take 1 tablet (20 mg total) by mouth daily. 01/07/21   Einar Pheasant, NP  ?Zinc 30 MG CAPS Take 30 mg by mouth daily.    [provider]  ? ? ?Physical Exam: ?Vitals:  ? 12/04/21 1700 12/04/21 1711 12/04/21 1800 12/04/21 1830  ?BP: 130/71  140/67 136/75  ?Pulse: 79  84 90  ?Resp: (!) 32  (!) 24 (!) 33  ?Temp:      ?TempSrc:      ?SpO2: 97%  96% 99%  ?Weight:  78 kg    ?Height:  '5\' 4"'$  (1.626 m)    ? ?Constitutional: NAD, calm, comfortable, elderly female appearing younger than stated age laying at approximately 40 degree incline in bed ?Eyes:  lids and conjunctivae normal ?ENMT: Mucous membranes are moist. ?Neck: normal, supple ?Respiratory: Faint expiratory wheezing to upper lobe with diffuse crackles.   normal respiratory effort on 2 L via nasal cannula. No accessory muscle use.  Able to speak in full sentences ?Cardiovascular: Regular rate and rhythm, 3 out of 6 systolic flow murmur but no rubs or gallops.  +2 pitting edema up to pretibial region bilaterally.  Abdomen: no tenderness,  Bowel sounds positive.  ?Musculoskeletal: no clubbing / cyanosis. No joint deformity upper and lower extremities. Normal muscle tone.  ?Skin: no rashes, lesions, ulcers. No induration ?Neurologic: CN  2-12 grossly intact. Strength 5/5 in all 4.  ?Psychiatric: Normal judgment and insight. Alert and oriented x 3. Normal mood. ?Data Reviewed: ? ?See HPI ? ?Assessment and Plan: ?* Acute respiratory failure with hypoxia (HCC) ?-Secondary to new onset CHF exacerbation ?-Presents with increase dyspnea, orthopnea and bilateral lower extremity edema with CXR finding of pulmonary vascular congestion and small effusion. Hypoxic to 87% on room air requiring 2L [place].  ?-Echo in 04/2021 showed EF of 60-65% with  dengenerative mitral valve with moderate to severe posterior mitral annular calcification at risk for functional mitral valve stenosis. Moderate aortic valve stenosis.  ?-Negative Lexiscan in 2019. Had coronary artery calcification seen on CT and evaluated by cardiology in 04/2021 and advised medical risk reduction. ?-Troponin low at 14. EKG with NSR.  ?-Suspect new onset CHF is secondary to worsening aortic valve stenosis.  Obtain new echocardiogram.  Discussed potential need for aortic valve repair in the near future.  Patient unsure if she would want to pursue this at her advanced age.Follows with Dr. Percival Spanish cardiology. ?-Daily IV '40mg'$  Lasix ?-strict intake and output, daily weights. Admit weight of 78kg. ? ? ? ?Hypokalemia ?repleted with oral potassium  ? ?CHF (congestive heart failure) ([place]) ? ? ? ?Cerebrovascular accident (CVA) ([place]) ?Continue statin ? ?Essential hypertension ?Controlled. Continue ACE-I.  ? ?Coronary artery disease involving native coronary artery of native heart without angina pectoris ?Stable.  Had negative Lexiscan Myoview in 2019. ? ?Dyslipidemia ?Continue statin ? ? ? ? ? Advance Care Planning:   Code Status: Full Code ? ?Consults: none ? ?Family Communication: Discussed with husband at bedside ? ?Severity of Illness: ?The appropriate patient status for this patient is INPATIENT. Inpatient status is judged to be reasonable and necessary in order to provide the required intensity of service to  ensure the patient's safety. The patient's presenting symptoms, physical exam findings, and initial radiographic and laboratory data in the context of their chronic comorbidities is felt to place them at h

## 2021-12-04 NOTE — ED Provider Notes (Signed)
?[facility] ?Provider Note ? ? ?CSN: [id number] ?Arrival date & time: 12/04/21  1615 ? ?  ? ?History ? ?Chief Complaint  ?Patient presents with  ? Shortness of Breath  ? ? ?Tiffany Washington is a 81 y.o. female. ? ? ?Shortness of Breath ? ?Patient is 81 year old female with a history of aortic stenosis who presents to the emergency department for 3 to 4 days of progressively worsening shortness of breath.  Per patient she does have shortness of breath chronically however for the past 4 days it has been very bad.  She report exertional dyspnea and orthopnea.  She does have mild cough however does not report significant coughing or sputum production.  Denies associated fever or chills.  Denies associated chest pain.  She does report mild swelling in her lower extremity.  She does report being compliant with her medication.  Denies vomiting or diarrhea.  Denies any recent sickness.  She does have an appointment with her cardiology tomorrow for general checkup however she reports she could not wait.  She does report a history of wheezing that started in November 2022.  However it has become significantly worse. Denies EtOH or tobacco use.  Denies any history of COPD.  Otherwise no other complaints. ? ? ?Home Medications ?Prior to Admission medications   ?Medication Sig Start Date End Date Taking? Authorizing Provider  ?acetaminophen (TYLENOL) 325 MG tablet Take 650 mg by mouth every 6 (six) hours as needed for moderate pain or headache.    [provider]  ?Ascorbic Acid (VITAMIN C) 1000 MG tablet Take 1,000 mg by mouth daily.    [provider]  ?B Complex Vitamins (B COMPLEX 100 PO) Take 1 tablet by mouth daily.    [provider]  ?lisinopril (ZESTRIL) 10 MG tablet Take 1 tablet (10 mg total) by mouth daily. 01/08/21   Einar Pheasant, NP  ?Multiple Vitamins-Minerals (MULTIVITAMIN WITH MINERALS) tablet Take 1 tablet by mouth daily.     [provider]  ?pramipexole (MIRAPEX) 0.25 MG tablet Take 0.25 mg by mouth 2 (two) times daily.     [provider]  ?rosuvastatin (CRESTOR) 20 MG tablet Take 1 tablet (20 mg total) by mouth daily. 01/07/21   Einar Pheasant, NP  ?Zinc 30 MG CAPS Take 30 mg by mouth daily.    [provider]  ?   ? ?Allergies    ?Carbamazepine, Ciprofloxacin, Diclofenac, Indomethacin, Meloxicam, Nortriptyline, Voltaren [diclofenac sodium], Gabapentin, Pamelor [nortriptyline hcl], Ropinirole, and Sulfa antibiotics   ? ?Review of Systems   ?Review of Systems  ?Respiratory:  Positive for shortness of breath.   ? ?Physical Exam ?Updated Vital Signs ?BP 136/75   Pulse 90   Temp 98.3 ?F (36.8 ?C) (Oral)   Resp (!) 33   Ht '5\' 4"'$  (1.626 m)   Wt 78 kg   SpO2 99%   BMI 29.52 kg/m?  ?Physical Exam ?Constitutional:   ?   Appearance: She is ill-appearing.  ?HENT:  ?   Head: Normocephalic.  ?Eyes:  ?   Extraocular Movements: Extraocular movements intact.  ?   Pupils: Pupils are equal, round, and reactive to light.  ?Cardiovascular:  ?   Rate and Rhythm: Normal rate.  ?   Heart sounds: Murmur heard.  ?Pulmonary:  ?   Effort: Tachypnea and respiratory distress present.  ?   Breath sounds: Examination of the right-upper field reveals wheezing. Examination of the left-upper field reveals wheezing. Examination  of the right-middle field reveals wheezing. Examination of the left-middle field reveals wheezing. Examination of the right-lower field reveals wheezing. Examination of the left-lower field reveals wheezing. Wheezing present.  ?Chest:  ?   Chest wall: No mass.  ?Abdominal:  ?   Tenderness: There is no guarding or rebound.  ?Musculoskeletal:  ?   Cervical back: Normal range of motion.  ?   Right lower leg: No tenderness. Edema present.  ?   Left lower leg: No tenderness. Edema present.  ?Skin: ?   General: Skin is warm.  ?   Capillary Refill: Capillary refill takes less than 2 seconds.  ?Neurological:  ?   General: No  focal deficit present.  ?   Mental Status: She is oriented to person, place, and time.  ? ? ?ED Results / Procedures / Treatments   ?Labs ?(all labs ordered are listed, but only abnormal results are displayed) ?Labs Reviewed  ?BASIC METABOLIC PANEL - Abnormal; Notable for the following components:  ?    Result Value  ? Potassium 3.4 (*)   ? CO2 21 (*)   ? Glucose, Bld 104 (*)   ? All other components within normal limits  ?CBC - Abnormal; Notable for the following components:  ? WBC 10.8 (*)   ? All other components within normal limits  ?BRAIN NATRIURETIC PEPTIDE - Abnormal; Notable for the following components:  ? B Natriuretic Peptide 306.9 (*)   ? All other components within normal limits  ?MAGNESIUM  ?TROPONIN I (HIGH SENSITIVITY)  ?TROPONIN I (HIGH SENSITIVITY)  ? ? ?EKG ?EKG Interpretation ? ?Date/Time:  Sunday Dec 04 2021 16:43:30 EDT ?Ventricular Rate:  87 ?PR Interval:  141 ?QRS Duration: 80 ?QT Interval:  354 ?QTC Calculation: 426 ?R Axis:   50 ?Text Interpretation: Sinus rhythm Nonspecific T abnormalities, lateral leads No significant change since last tracing Confirmed by Dorie Rank [phone]) on 12/04/2021 5:01:32 PM ? ?Radiology ?DG Chest 2 View ? ?Result Date: 12/04/2021 ?CLINICAL DATA:  Shortness of breath and extremity edema. EXAM: CHEST - 2 VIEW COMPARISON:  None Available. FINDINGS: Pulmonary vascular congestion with mixed interstitial airspace opacities bilaterally are noted compatible with pulmonary edema. Bilateral LOWER lung opacities/atelectasis noted. Small bilateral pleural effusions are present. The cardiomediastinal silhouette is grossly unremarkable. Probable compression fractures of a LOWER thoracic vertebra and L1 IMPRESSION: 1. Pulmonary vascular congestion with pulmonary edema and small effusions. 2. Bilateral LOWER lung opacities/atelectasis. Electronically Signed   By: Margarette Canada M.D.   On: 12/04/2021 17:52   ? ?Procedures ?Procedures  ? ?Medications Ordered in ED ?Medications   ?ipratropium-albuterol (DUONEB) 0.5-2.5 (3) MG/3ML nebulizer solution 3 mL (3 mLs Nebulization Given 12/04/21 1704)  ?potassium chloride SA (KLOR-CON M) CR tablet 40 mEq (40 mEq Oral Given 12/04/21 1818)  ?furosemide (LASIX) injection 60 mg (60 mg Intravenous Given 12/04/21 1834)  ? ? ?ED Course/ Medical Decision Making/ A&P ?  ?                        ?Medical Decision Making ?Problems Addressed: ?Acute respiratory failure with hypoxia [facility]): acute illness or injury that poses a threat to life or bodily functions ?Congestive heart failure, unspecified HF chronicity, unspecified heart failure type [facility]): acute illness or injury that poses a threat to life or bodily functions ? ?Amount and/or Complexity of Data Reviewed ?External Data Reviewed: labs, radiology and notes. ?   Details: Echo ?Labs: ordered. Decision-making details documented in ED Course. ?Radiology: ordered and independent  interpretation performed. Decision-making details documented in ED Course. ?ECG/medicine tests: ordered and independent interpretation performed. Decision-making details documented in ED Course. ? ?Risk ?Prescription drug management. ?Decision regarding hospitalization. ? ? ?Patient is 81 year old female who presents to the emergency department for 4 days of progressively worsening shortness of breath.  Per chart review, patient echo that was done few months ago shows EF of 50 to 55% and severe calcification of mitral and aortic valves.  She does have a history of aortic stenosis.  She has been followed by Dr. Percival Spanish, Jeneen Rinks ([facility]) for her heart murmur and valvular calcification. On arrival, patient in respiratory distress.  She was wheezing and tachypneic.  She did get hypoxic anytime she talked or had a small movement.  I started her on 3 L oxygen.  She is not tachycardic or febrile.  Her exam as above.  Patient presentation is concerning for acute onset CHF.  Other differential considered include pneumonia versus possible COPD  given the wheezing on her exam.  I have given her a DuoNeb however the wheezing is most likely secondary to CHF.  No history of asthma.  I have low suspicion for PE or thoracic dissection.  I have low suspicion for acute ACS e

## 2021-12-04 NOTE — Assessment & Plan Note (Addendum)
-  Secondary to new onset CHF exacerbation ?-Presents with increase dyspnea, orthopnea and bilateral lower extremity edema with CXR finding of pulmonary vascular congestion and small effusion. Hypoxic to 87% on room air requiring 2L [place].  ?-Echo in 04/2021 showed EF of 60-65% with dengenerative mitral valve with moderate to severe posterior mitral annular calcification at risk for functional mitral valve stenosis. Moderate aortic valve stenosis.  ?-Negative Lexiscan in 2019. Had coronary artery calcification seen on CT and evaluated by cardiology in 04/2021 and advised medical risk reduction. ?-Troponin low at 14. EKG with NSR.  ?-Suspect new onset CHF is secondary to worsening aortic valve stenosis.  Obtain new echocardiogram.  Discussed potential need for aortic valve repair in the near future.  Patient unsure if she would want to pursue this at her advanced age.Follows with Dr. Percival Spanish cardiology. ?-Daily IV '40mg'$  Lasix ?-strict intake and output, daily weights. Admit weight of 78kg. ? ? ?

## 2021-12-04 NOTE — Assessment & Plan Note (Signed)
repleted with oral potassium  ?

## 2021-12-04 NOTE — ED Notes (Signed)
Called [phone] to request purple man be initiated.  ?

## 2021-12-04 NOTE — ED Notes (Signed)
Admitting at bedside 

## 2021-12-05 ENCOUNTER — Ambulatory Visit: Payer: PPO | Admitting: Cardiology

## 2021-12-05 ENCOUNTER — Encounter (HOSPITAL_COMMUNITY): Payer: Self-pay | Admitting: Family Medicine

## 2021-12-05 ENCOUNTER — Inpatient Hospital Stay (HOSPITAL_COMMUNITY): Payer: PPO

## 2021-12-05 DIAGNOSIS — I340 Nonrheumatic mitral (valve) insufficiency: Secondary | ICD-10-CM

## 2021-12-05 DIAGNOSIS — I350 Nonrheumatic aortic (valve) stenosis: Secondary | ICD-10-CM | POA: Diagnosis not present

## 2021-12-05 DIAGNOSIS — I5021 Acute systolic (congestive) heart failure: Secondary | ICD-10-CM

## 2021-12-05 DIAGNOSIS — I509 Heart failure, unspecified: Secondary | ICD-10-CM | POA: Diagnosis not present

## 2021-12-05 DIAGNOSIS — J9601 Acute respiratory failure with hypoxia: Secondary | ICD-10-CM | POA: Diagnosis not present

## 2021-12-05 LAB — BASIC METABOLIC PANEL
Anion gap: 9 (ref 5–15)
BUN: 13 mg/dL (ref 8–23)
CO2: 23 mmol/L (ref 22–32)
Calcium: 8.9 mg/dL (ref 8.9–10.3)
Chloride: 106 mmol/L (ref 98–111)
Creatinine, Ser: 0.74 mg/dL (ref 0.44–1.00)
GFR, Estimated: >60 mL/min (ref >60)
Glucose, Bld: 125 mg/dL — ABNORMAL HIGH (ref 70–99)
Potassium: 3.9 mmol/L (ref 3.5–5.1)
Sodium: 138 mmol/L (ref 135–145)

## 2021-12-05 LAB — ECHOCARDIOGRAM COMPLETE
AR max vel: 0.77 cm2
AV Area VTI: 0.82 cm2
AV Area mean vel: 0.77 cm2
AV Mean grad: 33.2 mmHg
AV Peak grad: 62.7 mmHg
Ao pk vel: 3.96 m/s
Area-P 1/2: 2.97 cm2
Height: 64 in
MV VTI: 0.99 cm2
P 1/2 time: 426 msec
S' Lateral: 1.5 cm
Weight: 2785.6 oz

## 2021-12-05 LAB — CBC
HCT: 36.1 % (ref 36.0–46.0)
Hemoglobin: 11.9 g/dL — ABNORMAL LOW (ref 12.0–15.0)
MCH: 31.8 pg (ref 26.0–34.0)
MCHC: 33 g/dL (ref 30.0–36.0)
MCV: 96.5 fL (ref 80.0–100.0)
Platelets: 245 10*3/uL (ref 150–400)
RBC: 3.74 MIL/uL — ABNORMAL LOW (ref 3.87–5.11)
RDW: 13.2 % (ref 11.5–15.5)
WBC: 9.7 10*3/uL (ref 4.0–10.5)
nRBC: 0 % (ref 0.0–0.2)

## 2021-12-05 MED ORDER — SODIUM CHLORIDE 0.9% FLUSH: 3.0000 mL | INTRAVENOUS | Status: DC | PRN

## 2021-12-05 MED ORDER — SODIUM CHLORIDE 0.9% FLUSH
3.0000 mL | Freq: Two times a day (BID) | INTRAVENOUS | Status: DC
  Administered 2021-12-05 – 2021-12-07 (×4): 3 mL via INTRAVENOUS

## 2021-12-05 MED ORDER — SODIUM CHLORIDE 0.9 % IV SOLN: 250.0000 mL | INTRAVENOUS | Status: DC | PRN

## 2021-12-05 MED ORDER — SODIUM CHLORIDE 0.9 % IV SOLN: INTRAVENOUS | Status: DC

## 2021-12-05 NOTE — Consult Note (Addendum)
Cardiology Consultation:   Patient ID: Tiffany Washington MRN: [id number]; DOB: 1941/01/26  Admit date: 12/04/2021 Date of Consult: 12/05/2021  PCP:  Alysia Penna, MD   [facility] Providers Cardiologist:  Rollene Rotunda, MD        Patient Profile:   Tiffany Washington is a 81 y.o. female with a hx of intracranial hemorrhage 12/2020 felt hypertensive in etiology due to territory, mild carotid artery disease (1-39% BICA in 12/2020), coronary calcification, HTN, moderate AS/mild AI/mild MR by prior echo, arthritis, hard of hearing who is being seen 12/05/2021 for the evaluation of CHF and progressive valvular disease at the request of Dr. Pola Corn. Echo showed moderate-severe MR, moderate-severe AS, and moderate MS.  History of Present Illness:   Tiffany Washington has prior history as outlined above. She was previously known to have coronary calcification on a prior CT, and had negative nuclear stress test in 2019. She's also been followed for aortic stenosis with last echo showing moderate AS, mild AI, mild MR and MV features raising question of future risk for functional MS. She had admission in 12/2020 with basal ganglia ICH felt due to hypertension; taken off aspirin at that time, lisinopril added. CT angio of the head was negative for abnormal cerebral vascularity.  She presented this admission with 3 day history of SOB, orthopnea, cough, and edema without fever or chills. The last 2 nights were really bad per her report. She had not had any chest pain whatsoever. Due to persistence of sx she came to ED and was found to be hypoxic at 83% on RA and required [place] O2. She was given nebs and IV Lasix (60mg  then 40mg ) as well as potassium repletion. CXR showed pulmonary vascular congestion with pulmonary edema, small effusions, and bilateral lower lung opacities/atx. 2D echo showed EF >75% (hyperdynamic), g2DD, normal RVSP, severely elevated PASP, moderate LAE, splay artifact with degenerative MV, moderate-severe  MR, moderate MS, moderate TR, mild AI, moderate-severe AS - MS/AS have increased. She reports excellent UOP and improved dyspnea/edema. (I/O's may not be complete.) Initial BNP 306, Hgb 12.6->11.9, normal Cr, K 3.4->3.9. She was able to wean to RA today, 94-95%. Tele shows NSR with very brief episode of SVT overnight.   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 Authorizing Provider  acetaminophen (TYLENOL) 325 MG tablet Take 650 mg by mouth every 6 (six) hours as needed for moderate pain or headache.   Yes [provider]  Ascorbic Acid (VITAMIN C) 1000 MG tablet Take  1,000 mg by mouth daily.   Yes [provider]  B Complex Vitamins (B COMPLEX 100 PO) Take 1 tablet by mouth daily.   Yes [provider]  lisinopril (ZESTRIL) 10 MG tablet Take 1 tablet (10 mg total) by mouth daily. 01/08/21  Yes Merry Lofty, NP  Multiple Vitamins-Minerals (MULTIVITAMIN WITH MINERALS) tablet Take 1 tablet by mouth daily.    Yes [provider]  pramipexole (MIRAPEX) 0.25 MG tablet Take 0.25 mg by mouth 2 (two) times daily.    Yes [provider]  rosuvastatin (CRESTOR) 20 MG  tablet Take 1 tablet (20 mg total) by mouth daily. 01/07/21  Yes Merry Lofty, NP  Zinc 30 MG CAPS Take 30 mg by mouth daily.   Yes [provider]    Inpatient Medications: Scheduled Meds:  enoxaparin (LOVENOX) injection  40 mg Subcutaneous Q24H   furosemide  40 mg Intravenous Daily   pramipexole  0.25 mg Oral 2 times per day   rosuvastatin  20 mg Oral QHS   Continuous Infusions:  PRN Meds:   Allergies:    Allergies  Allergen Reactions   Carbamazepine Other (See Comments)    Doesn't remember reaction   Ciprofloxacin Other (See Comments)    Patient stated she "cannot take this"   Diclofenac Other (See Comments)    Reaction not recalled   Indomethacin Other (See Comments)    Reaction not recalled   Meloxicam Other (See Comments)    Reaction not recalled   Nortriptyline Other (See Comments)    Reaction not recalled   Voltaren [Diclofenac Sodium] Other (See Comments)    Reaction not recalled   Gabapentin Other (See Comments)    Makes the patient unable to sleep   Pamelor [Nortriptyline Hcl] Other (See Comments)    insomnia   Ropinirole Other (See Comments)    Speed up the restless legs   Sulfa Antibiotics Diarrhea and Nausea And Vomiting    Social History:   Social History   Socioeconomic History   Marital status: Married    Spouse name: Renae Gloss   Number of children: 2   Years of education: Not on file   Highest education level: High school graduate  Occupational History   Not on file  Tobacco Use   Smoking status: Never   Smokeless tobacco: Never  Vaping Use   Vaping Use: Never used  Substance and Sexual Activity   Alcohol use: No   Drug use: No   Sexual activity: Not on file  Other Topics Concern   Not on file  Social History Narrative   Working on a farm   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   She is married for 52 years and they have two children.   Social Determinants of Health   Financial Resource Strain: Not on file   Food Insecurity: Not on file  Transportation Needs: Not on file  Physical Activity: Not on file  Stress: Not on file  Social Connections: Not on file  Intimate Partner Violence: Not on file    Family History:   Family History  Problem Relation Age of Onset   Hypertension Mother    Congestive Heart Failure Mother        Died, 101   Heart failure Mother    Pulmonary embolism Father        Died, 38   Diabetes Sister    CAD Brother 56       CABG   Diabetes  Brother    Gout Son      ROS:  Please see the history of present illness.  All other ROS reviewed and negative.     Physical Exam/Data:   Vitals:   12/04/21 2000 12/04/21 2051 12/05/21 0030 12/05/21 0452  BP: 121/66 130/65 121/67 108/67  Pulse: 87 89 89 78  Resp: 17 18 17 18   Temp:  99.8 F (37.7 C) 98.4 F (36.9 C) 98.1 F (36.7 C)  TempSrc:  Oral Oral Oral  SpO2: 95% 91% 96% 92%  Weight:  79.1 kg  79 kg  Height:  5\' 4"  (1.626 m)      Intake/Output Summary (Last 24 hours) at 12/05/2021 1705 Last data filed at 12/05/2021 1400 Gross per 24 hour  Intake 1080 ml  Output 1650 ml  Net -570 ml      12/05/2021    4:52 AM 12/04/2021    8:51 PM 12/04/2021    5:11 PM  Last 3 Weights  Weight (lbs) 174 lb 1.6 oz 174 lb 4.8 oz 172 lb  Weight (kg) 78.971 kg 79.062 kg 78.019 kg     Body mass index is 29.88 kg/m.  General: Well developed, well nourished, in no acute distress. Head: Normocephalic, atraumatic, sclera non-icteric, no xanthomas, nares are without discharge. Neck: Negative for carotid bruits. JVP not elevated. Lungs: Coarse at bases, no wheezing or rhonchi, unlabored Heart: RRR with loud SEM, diminished S2, no rubs or gallops.  Abdomen: Soft, non-tender, non-distended with normoactive bowel sounds. No rebound/guarding. Extremities: No clubbing or cyanosis. No edema. Distal pedal pulses are 2+ and equal bilaterally. Neuro: Alert and oriented X 3. Moves all extremities spontaneously. Hard of hearing. Psych:   Responds to questions appropriately with a normal affect.   EKG:  The EKG was personally reviewed and demonstrates:  NSR 87bpm nonspecific STTW changes   Telemetry:  Telemetry was personally reviewed and demonstrates:  NSR with brief episode of SVT  Relevant CV Studies: 2D echo today    1. Left ventricular ejection fraction, by estimation, is >75%. The left  ventricle has hyperdynamic function. The left ventricle has no regional  wall motion abnormalities. Left ventricular diastolic parameters are  consistent with Grade II diastolic  dysfunction (pseudonormalization).   2. Right ventricular systolic function is normal. The right ventricular  size is normal. There is severely elevated pulmonary artery systolic  pressure. The estimated right ventricular systolic pressure is 74.6 mmHg.   3. Left atrial size was moderately dilated.   4. Splay artifact. The mitral valve is degenerative. Moderate to severe  mitral valve regurgitation. Moderate mitral stenosis. The mean mitral  valve gradient is 8.0 mmHg. Severe mitral annular calcification.   5. Tricuspid valve regurgitation is moderate.   6. The aortic valve is calcified. There is moderate calcification of the  aortic valve. There is moderate thickening of the aortic valve. Aortic  valve regurgitation is mild. Moderate to severe aortic valve stenosis.  Aortic regurgitation PHT measures 426  msec. Aortic valve area, by VTI measures 0.82 cm. Aortic valve mean  gradient measures 33.2 mmHg. Aortic valve Vmax measures 3.96 m/s.   7. The inferior vena cava is normal in size with greater than 50%  respiratory variability, suggesting right atrial pressure of 3 mmHg.   Comparison(s): Aortic stenosis and mitral stenosis have increased.   Laboratory Data:  High Sensitivity Troponin:   Recent Labs  Lab 12/04/21 1629 12/04/21 1820  TROPONINIHS 14 17     Chemistry Recent [employer]  12/04/21 1629 12/05/21 0153  NA 137 138  K 3.4* 3.9   CL 108 106  CO2 21* 23  GLUCOSE 104* 125*  BUN 12 13  CREATININE 0.72 0.74  CALCIUM 9.2 8.9  MG 2.0  --   GFRNONAA >60 >60  ANIONGAP 8 9    No results for input(s): PROT, ALBUMIN, AST, ALT, ALKPHOS, BILITOT in the last 168 hours. Lipids No results for input(s): CHOL, TRIG, HDL, LABVLDL, LDLCALC, CHOLHDL in the last 168 hours.  Hematology Recent Labs  Lab 12/04/21 1629 12/05/21 0153  WBC 10.8* 9.7  RBC 3.88 3.74*  HGB 12.6 11.9*  HCT 37.2 36.1  MCV 95.9 96.5  MCH 32.5 31.8  MCHC 33.9 33.0  RDW 13.2 13.2  PLT 246 245   Thyroid No results for input(s): TSH, FREET4 in the last 168 hours.  BNP Recent Labs  Lab 12/04/21 1629  BNP 306.9*    DDimer No results for input(s): DDIMER in the last 168 hours.   Radiology/Studies:  DG Chest 2 View  Result Date: 12/04/2021 CLINICAL DATA:  Shortness of breath and extremity edema. EXAM: CHEST - 2 VIEW COMPARISON:  None Available. FINDINGS: Pulmonary vascular congestion with mixed interstitial airspace opacities bilaterally are noted compatible with pulmonary edema. Bilateral LOWER lung opacities/atelectasis noted. Small bilateral pleural effusions are present. The cardiomediastinal silhouette is grossly unremarkable. Probable compression fractures of a LOWER thoracic vertebra and L1 IMPRESSION: 1. Pulmonary vascular congestion with pulmonary edema and small effusions. 2. Bilateral LOWER lung opacities/atelectasis. Electronically Signed   By: Harmon Pier M.D.   On: 12/04/2021 17:52   ECHOCARDIOGRAM COMPLETE  Result Date: 12/05/2021    ECHOCARDIOGRAM REPORT   Patient Name:   Tiffany Washington Date of Exam: 12/05/2021 Medical Rec #:  [id number]        Height:       64.0 in Accession #:    [id number]       Weight:       174.1 lb Date of Birth:  28-Feb-1941         BSA:          1.844 m Patient Age:    80 years         BP:           108/67 mmHg Patient Gender: F                HR:           83 bpm. Exam Location:  Inpatient Procedure: 2D Echo, Cardiac  Doppler and Color Doppler Indications:    CHF- Acute systolic  History:        Patient has prior history of Echocardiogram examinations, most                 recent 05/03/2021. CHF, CAD; Risk Factors:Hypertension.  Sonographer:    Eduard Roux Referring Phys: [id number] CHING T TU IMPRESSIONS  1. Left ventricular ejection fraction, by estimation, is >75%. The left ventricle has hyperdynamic function. The left ventricle has no regional wall motion abnormalities. Left ventricular diastolic parameters are consistent with Grade II diastolic dysfunction (pseudonormalization).  2. Right ventricular systolic function is normal. The right ventricular size is normal. There is severely elevated pulmonary artery systolic pressure. The estimated right ventricular systolic pressure is 74.6 mmHg.  3. Left atrial size was moderately dilated.  4. Splay artifact. The mitral valve is degenerative. Moderate to severe mitral valve regurgitation. Moderate mitral stenosis. The mean mitral valve gradient is 8.0 mmHg.  Severe mitral annular calcification.  5. Tricuspid valve regurgitation is moderate.  6. The aortic valve is calcified. There is moderate calcification of the aortic valve. There is moderate thickening of the aortic valve. Aortic valve regurgitation is mild. Moderate to severe aortic valve stenosis. Aortic regurgitation PHT measures 426 msec. Aortic valve area, by VTI measures 0.82 cm. Aortic valve mean gradient measures 33.2 mmHg. Aortic valve Vmax measures 3.96 m/s.  7. The inferior vena cava is normal in size with greater than 50% respiratory variability, suggesting right atrial pressure of 3 mmHg. Comparison(s): Aortic stenosis and mitral stenosis have increased. FINDINGS  Left Ventricle: Left ventricular ejection fraction, by estimation, is >75%. The left ventricle has hyperdynamic function. The left ventricle has no regional wall motion abnormalities. The left ventricular internal cavity size was normal in size. There  is no left ventricular hypertrophy. Left ventricular diastolic parameters are consistent with Grade II diastolic dysfunction (pseudonormalization). Right Ventricle: The right ventricular size is normal. No increase in right ventricular wall thickness. Right ventricular systolic function is normal. There is severely elevated pulmonary artery systolic pressure. The tricuspid regurgitant velocity is 4.02 m/s, and with an assumed right atrial pressure of 10 mmHg, the estimated right ventricular systolic pressure is 74.6 mmHg. Left Atrium: Left atrial size was moderately dilated. Right Atrium: Right atrial size was normal in size. Pericardium: There is no evidence of pericardial effusion. Mitral Valve: Splay artifact. The mitral valve is degenerative in appearance. There is moderate thickening of the mitral valve leaflet(s). There is moderate calcification of the mitral valve leaflet(s). Severe mitral annular calcification. Moderate to severe mitral valve regurgitation, with eccentric medially directed jet. Moderate mitral valve stenosis. MV peak gradient, 30.2 mmHg. The mean mitral valve gradient is 8.0 mmHg. Tricuspid Valve: The tricuspid valve is normal in structure. Tricuspid valve regurgitation is moderate . No evidence of tricuspid stenosis. Aortic Valve: The aortic valve is calcified. There is moderate calcification of the aortic valve. There is moderate thickening of the aortic valve. Aortic valve regurgitation is mild. Aortic regurgitation PHT measures 426 msec. Moderate to severe aortic stenosis is present. Aortic valve mean gradient measures 33.2 mmHg. Aortic valve peak gradient measures 62.7 mmHg. Aortic valve area, by VTI measures 0.82 cm. Pulmonic Valve: The pulmonic valve was normal in structure. Pulmonic valve regurgitation is trivial. No evidence of pulmonic stenosis. Aorta: The aortic root is normal in size and structure. Venous: The inferior vena cava is normal in size with greater than 50%  respiratory variability, suggesting right atrial pressure of 3 mmHg. IAS/Shunts: No atrial level shunt detected by color flow Doppler.  LEFT VENTRICLE PLAX 2D LVIDd:         4.30 cm   Diastology LVIDs:         1.50 cm   LV e' medial:    3.71 cm/s LV PW:         1.10 cm   LV E/e' medial:  45.8 LV IVS:        1.10 cm   LV e' lateral:   6.70 cm/s LVOT diam:     1.80 cm   LV E/e' lateral: 25.4 LV SV:         62 LV SV Index:   34 LVOT Area:     2.54 cm  RIGHT VENTRICLE             IVC RV Basal diam:  2.80 cm     IVC diam: 2.10 cm RV S prime:  14.00 cm/s TAPSE (M-mode): 2.2 cm LEFT ATRIUM             Index        RIGHT ATRIUM           Index LA diam:        4.60 cm 2.49 cm/m   RA Area:     14.00 cm LA Vol (A2C):   73.7 ml 39.96 ml/m  RA Volume:   32.70 ml  17.73 ml/m LA Vol (A4C):   72.8 ml 39.47 ml/m LA Biplane Vol: 76.7 ml 41.59 ml/m  AORTIC VALVE                     PULMONIC VALVE AV Area (Vmax):    0.77 cm      PV Vmax:       1.05 m/s AV Area (Vmean):   0.77 cm      PV Peak grad:  4.4 mmHg AV Area (VTI):     0.82 cm AV Vmax:           396.00 cm/s AV Vmean:          262.200 cm/s AV VTI:            0.758 m AV Peak Grad:      62.7 mmHg AV Mean Grad:      33.2 mmHg LVOT Vmax:         120.00 cm/s LVOT Vmean:        79.500 cm/s LVOT VTI:          0.245 m LVOT/AV VTI ratio: 0.32 AI PHT:            426 msec  AORTA Ao Root diam: 3.10 cm Ao Asc diam:  3.80 cm MITRAL VALVE                TRICUSPID VALVE MV Area (PHT): 2.97 cm     TR Peak grad:   64.6 mmHg MV Area VTI:   0.99 cm     TR Vmax:        402.00 cm/s MV Peak grad:  30.2 mmHg MV Mean grad:  8.0 mmHg     SHUNTS MV Vmax:       2.75 m/s     Systemic VTI:  0.24 m MV Vmean:      126.5 cm/s   Systemic Diam: 1.80 cm MV Decel Time: 255 msec MV E velocity: 170.00 cm/s MV A velocity: 127.00 cm/s MV E/A ratio:  1.34 Donato Schultz MD Electronically signed by Donato Schultz MD Signature Date/Time: 12/05/2021/12:51:18 PM    Final      Assessment and Plan:   1. Acute  hypoxic respiratory failure felt predominantly due to acute diastolic CHF in the context of valvular disease (moderate-severe MR, moderate-severe AS, moderate mitral stenosis) - symptoms dramatically improved with IV lasix, now on RA - 2D echo showed EF >75% (hyperdynamic), g2DD, normal RVSP, severely elevated PASP, moderate LAE, splay artifact with degenerative MV, moderate-severe MR, moderate MS, moderate TR, mild AI, moderate-severe AS  - volume status improved on IV Lasix. - per d/w MD, will tentatively plan on [facility] tomorrow. He will discuss with patient. Per our discussion will hold off pre-cath ASA given prior issues with NSAIDs and prior h/o ICH, and hold further IV Lasix. I did send a preliminary message to structural APPs regarding consult but Dr. Elease Hashimoto will review with them tomorrow - pre-cath IV fluids were ordered as [facility] given CHF but  can decide in AM based on creatinine whether this needs adjusted  2. Severe pulmonary HTN by echo - ? r/t fluid overload and valvular disease - hypoxia has resolved with diuresis, arguing against acute PE - no longer presently SOB, no pleuritic CP - will d/w MD  3. Coronary calcification by prior CT - no chest pain, negative troponins - continue statin and check LFTs/lipids in AM  4. Essential HTN - BP controlled presently - IM held lisinopril in lieu of starting Lasix - last BP 107/62  5. PSVT - very brief episode on telemetry late last night, follow for recurrence - will add TSH to AM labs  6. H/o ICH in 12/2020 felt 2/2 hypertension - off ASA due to this  7. Mild anemia, hyperglycemia - per primary team   Risk Assessment/Risk Scores:        New York Heart Association (NYHA) Functional Class NYHA Class IV on arrival now II-III        For questions or updates, please contact [facility] Please consult [email or web address] for contact info under    Signed, Laurann Montana, PA-C  12/05/2021 5:05 PM  Attending Note:   The patient was  seen and examined.  Agree with assessment and plan as noted above.  Changes made to the above note as needed.  Patient seen and independently examined with Ronie Spies, PA .   We discussed all aspects of the encounter. I agree with the assessment and plan as stated above.   Acute hypoxic failure: Patient has multiple valvular abnormalities including moderate to severe aortic stenosis, moderate to severe mitral regurgitation, moderate mitral stenosis. She is feeling quite a bit better after diuresing.  She is breathing much more comfortably.  She likely will need to have these valvular issues addressed.  We will have her seen by the structural heart team tomorrow to help [place] plan her course.  We have scheduled her for heart catheterization later tomorrow. We may also try to add in a transesophageal echo prior to the cath if possible.  We discussed the risks, benefits, options of heart catheterization.  She understands and agrees to proceed.  2.  Pulmonary hypertension: Almost certainly due to her severe valvular disease.  She has normal left ventricular systolic function.  3.  Hypertension: Blood pressures on the low side today following diuresis.  4.  Atrial tachycardia: Patient had some very brief episodes (perhaps 20-25 beats) of atrial tachycardia.  Does not look like atrial fibrillation.  This may be due to her heart failure and severe valvular disease.  5.  History of intracranial hemorrhage.  She is not not on any aspirin or anticoagulants.   I have spent a total of 40 minutes with patient reviewing hospital  notes , telemetry, EKGs, labs and examining patient as well as establishing an assessment and plan that was discussed with the patient.  > 50% of time was spent in direct patient care.    Vesta Mixer, Montez Hageman., MD, [facility] 12/05/2021, 5:38 PM [address]  [address] Office [phone] Pager [phone]

## 2021-12-05 NOTE — Progress Notes (Signed)
?PROGRESS NOTE ? ?Tiffany Washington  ?DOB: [birth year]  ?PCP: Velna Hatchet, MD ?EXB:[id number]  ?DOA: 12/04/2021 ? LOS: 1 day  ?Hospital Day: 2 ? ?Brief narrative: ?Tiffany Washington is a 81 y.o. female with PMH significant for HTN, HLD, moderate aortic stenosis, left basal ganglia hemorrhage, CAD, GERD. ?Patient presented to the ED on 5/7 with complaint of worsening shortness of breath even on rest.  Also complains of orthopnea, progressively worsening bilateral lower extremity edema.  ? ?In the ED, she was afebrile, hemodynamically stable but O2 sat was 83% on room air and required 2 L per nasal collar. ?Chest x-ray showed pulmonary congestion/edema ?She was given DuoNeb and IV Lasix 60 mg. ?Admitted to hospitalist service. ? ?Subjective: ?Patient was seen and examined this afternoon. ?Elderly Caucasian female.  Lying on bed.  Feels better.  Not on supplemental oxygen this afternoon.  Husband and son at bedside. ?Patient states he follows up with cardiologist Dr. Percival Spanish as an outpatient. She was apparently due for a follow-up with him today but she ended up coming to the ED for symptoms. ? ?Principal Problem: ?  Acute respiratory failure with hypoxia ([place]) ?Active Problems: ?  Dyslipidemia ?  Coronary artery disease involving native coronary artery of native heart without angina pectoris ?  Essential hypertension ?  Cerebrovascular accident (CVA) ([place]) ?  CHF (congestive heart failure) ([place]) ?  Hypokalemia ?  ? ? ?Assessment and Plan: ?New onset CHF exacerbation ?Severe aortic stenosis ?Essential hypertension ?-Patient presented with progressive worsening dyspnea, orthopnea, bilateral lower extremity edema.   ?Echocardiogram with EF more than 10%, grade 2 diastolic dysfunction, elevated right ventricle systolic pressure over 75 and also showed moderate to severe aortic valve stenosis. ?-Currently on IV Lasix 40 mg daily.  Keep lisinopril on hold ?-Cardiology consultation called.  May need TAVR. ? ?Coronary artery  disease  ?-Stable.  Had negative Lexiscan Myoview in 2019. ? ?Acute respiratory failure with hypoxia ([place]) ?-Initially required supplemental oxygen.   ?-Currently breathing on room air at rest. ?   ?Hypokalemia ?-repleted with oral potassium  ?Recent Labs  ?Lab 12/04/21 ?1629 12/05/21 ?0153  ?K 3.4* 3.9  ?MG 2.0  --   ? ?Cerebrovascular accident  ?HLD ?-Continue statin ? ?Restless leg syndrome ?-Continue Mirapex ?   ?Goals of care ?  Code Status: Full Code  ? ? ?Mobility: Uses a cane at home.  Encourage ambulation ? ?Skin assessment:  ?  ? ?Nutritional status:  ?Body mass index is 29.88 kg/m?.  ?  ?  ? ? ? ? ?Diet:  ?Diet Order   ? ?       ?  Diet Heart Room service appropriate? Yes; Fluid consistency: Thin  Diet effective now       ?  ? ?  ?  ? ?  ? ? ?DVT prophylaxis:  ?enoxaparin (LOVENOX) injection 40 mg Start: 12/05/21 1000 ?  ?Antimicrobials: None ?Fluid: None ?Consultants: Cardiology called ?Family Communication: Family at bedside ? ?Status is: Inpatient ? ?Continue in-hospital care because: May need intervention ?Level of care: Telemetry Cardiac  ? ?Dispo: The patient is from: Home ?             Anticipated d/c is to: Depend on clinical course ?             Patient currently is not medically stable to d/c. ?  Difficult to place patient No ? ? ? ? ?Infusions:  ? ? ?Scheduled Meds: ? enoxaparin (LOVENOX) injection  40 mg Subcutaneous  Q24H  ? furosemide  40 mg Intravenous Daily  ? pramipexole  0.25 mg Oral 2 times per day  ? rosuvastatin  20 mg Oral QHS  ? ? ?PRN meds: ?  ? ?Antimicrobials: ?Anti-infectives (From admission, onward)  ? ? None  ? ?  ? ? ?Objective: ?Vitals:  ? 12/05/21 0030 12/05/21 0452  ?BP: 121/67 108/67  ?Pulse: 89 78  ?Resp: 17 18  ?Temp: 98.4 ?F (36.9 ?C) 98.1 ?F (36.7 ?C)  ?SpO2: 96% 92%  ? ? ?Intake/Output Summary (Last 24 hours) at 12/05/2021 1505 ?Last data filed at 12/05/2021 1400 ?Gross per 24 hour  ?Intake 1080 ml  ?Output 1650 ml  ?Net -570 ml  ? ?Filed Weights  ? 12/04/21 1711 12/04/21  2051 12/05/21 0452  ?Weight: 78 kg 79.1 kg 79 kg  ? ?Weight change:  ?Body mass index is 29.88 kg/m?.  ? ?Physical Exam: ?General exam: Pleasant, elderly Caucasian female.  Not in distress at this time ?Skin: No rashes, lesions or ulcers. ?HEENT: Atraumatic, normocephalic, no obvious bleeding ?Lungs: Clear to auscultation bilaterally ?CVS: Regular rate and rhythm, strong ejection systolic murmur ?GI/Abd soft, nontender, nondistended, bowel sound present ?CNS: Alert, awake, oriented x3 ?Psychiatry: Mood appropriate ?Extremities: Trace bilateral pedal edema, no calf tenderness ? ?Data Review: I have personally reviewed the laboratory data and studies available. ? ?F/u labs ordered ?Unresulted Labs (From admission, onward)  ? ?  Start     Ordered  ? 12/06/21 0500  CBC with Differential/Platelet  Tomorrow morning,   R       ?Question:  Specimen collection method  Answer:  Lab=Lab collect  ? 12/05/21 1505  ? 12/06/21 0973  Basic metabolic panel  Daily,   R     ?Question:  Specimen collection method  Answer:  Lab=Lab collect  ? 12/05/21 1505  ? ?  ?  ? ?  ? ? ?Signed, ?Terrilee Croak, MD ?[facility] ?12/05/2021 ? ? ? ? ? ? ? ? ? ? ? ? ?

## 2021-12-06 ENCOUNTER — Encounter (HOSPITAL_COMMUNITY)
Admission: EM | Disposition: A | Discharge status: Home / Self Care | Payer: Self-pay | Source: Home / Self Care | Attending: Internal Medicine

## 2021-12-06 DIAGNOSIS — I509 Heart failure, unspecified: Secondary | ICD-10-CM | POA: Diagnosis not present

## 2021-12-06 DIAGNOSIS — I2510 Atherosclerotic heart disease of native coronary artery without angina pectoris: Secondary | ICD-10-CM | POA: Diagnosis not present

## 2021-12-06 DIAGNOSIS — I350 Nonrheumatic aortic (valve) stenosis: Secondary | ICD-10-CM | POA: Diagnosis not present

## 2021-12-06 DIAGNOSIS — J9601 Acute respiratory failure with hypoxia: Secondary | ICD-10-CM | POA: Diagnosis not present

## 2021-12-06 HISTORY — PX: RIGHT/LEFT HEART CATH AND CORONARY ANGIOGRAPHY: CATH118266

## 2021-12-06 LAB — POCT I-STAT EG7
Acid-Base Excess: 1 mmol/L (ref 0.0–2.0)
Bicarbonate: 26.5 mmol/L (ref 20.0–28.0)
Calcium, Ion: 1.25 mmol/L (ref 1.15–1.40)
HCT: 37 % (ref 36.0–46.0)
Hemoglobin: 12.6 g/dL (ref 12.0–15.0)
O2 Saturation: 67 %
Potassium: 3.7 mmol/L (ref 3.5–5.1)
Sodium: 138 mmol/L (ref 135–145)
TCO2: 28 mmol/L (ref 22–32)
pCO2, Ven: 42.5 mmHg — ABNORMAL LOW (ref 44–60)
pH, Ven: 7.402 (ref 7.25–7.43)
pO2, Ven: 35 mmHg (ref 32–45)

## 2021-12-06 LAB — POCT I-STAT 7, (LYTES, BLD GAS, ICA,H+H)
Acid-Base Excess: 0 mmol/L (ref 0.0–2.0)
Bicarbonate: 24.3 mmol/L (ref 20.0–28.0)
Calcium, Ion: 1.23 mmol/L (ref 1.15–1.40)
HCT: 36 % (ref 36.0–46.0)
Hemoglobin: 12.2 g/dL (ref 12.0–15.0)
O2 Saturation: 93 %
Potassium: 3.7 mmol/L (ref 3.5–5.1)
Sodium: 138 mmol/L (ref 135–145)
TCO2: 25 mmol/L (ref 22–32)
pCO2 arterial: 37.5 mmHg (ref 32–48)
pH, Arterial: 7.42 (ref 7.35–7.45)
pO2, Arterial: 64 mmHg — ABNORMAL LOW (ref 83–108)

## 2021-12-06 LAB — CBC WITH DIFFERENTIAL/PLATELET
Abs Immature Granulocytes: 0.03 10*3/uL (ref 0.00–0.07)
Basophils Absolute: 0.1 10*3/uL (ref 0.0–0.1)
Basophils Relative: 1 %
Eosinophils Absolute: 0.3 10*3/uL (ref 0.0–0.5)
Eosinophils Relative: 3 %
HCT: 36.1 % (ref 36.0–46.0)
Hemoglobin: 12.3 g/dL (ref 12.0–15.0)
Immature Granulocytes: 0 %
Lymphocytes Relative: 33 %
Lymphs Abs: 2.8 10*3/uL (ref 0.7–4.0)
MCH: 32.5 pg (ref 26.0–34.0)
MCHC: 34.1 g/dL (ref 30.0–36.0)
MCV: 95.3 fL (ref 80.0–100.0)
Monocytes Absolute: 0.9 10*3/uL (ref 0.1–1.0)
Monocytes Relative: 10 %
Neutro Abs: 4.5 10*3/uL (ref 1.7–7.7)
Neutrophils Relative %: 53 %
Platelets: 250 10*3/uL (ref 150–400)
RBC: 3.79 MIL/uL — ABNORMAL LOW (ref 3.87–5.11)
RDW: 13.2 % (ref 11.5–15.5)
WBC: 8.5 10*3/uL (ref 4.0–10.5)
nRBC: 0 % (ref 0.0–0.2)

## 2021-12-06 LAB — CBC
HCT: 43.1 % (ref 36.0–46.0)
Hemoglobin: 14.3 g/dL (ref 12.0–15.0)
MCH: 31.6 pg (ref 26.0–34.0)
MCHC: 33.2 g/dL (ref 30.0–36.0)
MCV: 95.1 fL (ref 80.0–100.0)
Platelets: 247 10*3/uL (ref 150–400)
RBC: 4.53 MIL/uL (ref 3.87–5.11)
RDW: 12.9 % (ref 11.5–15.5)
WBC: 7.4 10*3/uL (ref 4.0–10.5)
nRBC: 0 % (ref 0.0–0.2)

## 2021-12-06 LAB — LIPID PANEL
Cholesterol: 111 mg/dL (ref 0–200)
HDL: 38 mg/dL — ABNORMAL LOW (ref >40)
LDL Cholesterol: 52 mg/dL (ref 0–99)
Total CHOL/HDL Ratio: 2.9 RATIO
Triglycerides: 107 mg/dL (ref <150)
VLDL: 21 mg/dL (ref 0–40)

## 2021-12-06 LAB — HEPATIC FUNCTION PANEL
ALT: 21 U/L (ref 0–44)
AST: 22 U/L (ref 15–41)
Albumin: 3.2 g/dL — ABNORMAL LOW (ref 3.5–5.0)
Alkaline Phosphatase: 62 U/L (ref 38–126)
Bilirubin, Direct: 0.1 mg/dL (ref 0.0–0.2)
Indirect Bilirubin: 0.9 mg/dL (ref 0.3–0.9)
Total Bilirubin: 1 mg/dL (ref 0.3–1.2)
Total Protein: 6.5 g/dL (ref 6.5–8.1)

## 2021-12-06 LAB — BASIC METABOLIC PANEL
Anion gap: 9 (ref 5–15)
BUN: 21 mg/dL (ref 8–23)
CO2: 24 mmol/L (ref 22–32)
Calcium: 9.1 mg/dL (ref 8.9–10.3)
Chloride: 103 mmol/L (ref 98–111)
Creatinine, Ser: 0.7 mg/dL (ref 0.44–1.00)
GFR, Estimated: >60 mL/min (ref >60)
Glucose, Bld: 101 mg/dL — ABNORMAL HIGH (ref 70–99)
Potassium: 3.6 mmol/L (ref 3.5–5.1)
Sodium: 136 mmol/L (ref 135–145)

## 2021-12-06 LAB — CREATININE, SERUM
Creatinine, Ser: 0.78 mg/dL (ref 0.44–1.00)
GFR, Estimated: >60 mL/min (ref >60)

## 2021-12-06 LAB — TSH: TSH: 3.141 u[IU]/mL (ref 0.350–4.500)

## 2021-12-06 SURGERY — RIGHT/LEFT HEART CATH AND CORONARY ANGIOGRAPHY
Anesthesia: LOCAL

## 2021-12-06 MED ORDER — ACETAMINOPHEN 325 MG PO TABS
650.0000 mg | ORAL_TABLET | ORAL | Status: DC | PRN
  Administered 2021-12-08: 650 mg via ORAL
  Filled 2021-12-06: qty 2

## 2021-12-06 MED ORDER — MIDAZOLAM HCL 2 MG/2ML IJ SOLN
INTRAMUSCULAR | Status: DC | PRN
  Administered 2021-12-06: .5 mg via INTRAVENOUS

## 2021-12-06 MED ORDER — IOHEXOL 350 MG/ML SOLN
INTRAVENOUS | Status: DC | PRN
  Administered 2021-12-06: 45 mL via INTRA_ARTERIAL

## 2021-12-06 MED ORDER — HEPARIN SODIUM (PORCINE) 5000 UNIT/ML IJ SOLN
5000.0000 [IU] | Freq: Three times a day (TID) | INTRAMUSCULAR | Status: DC
  Administered 2021-12-07 – 2021-12-08 (×3): 5000 [IU] via SUBCUTANEOUS
  Filled 2021-12-06 (×4): qty 1

## 2021-12-06 MED ORDER — FENTANYL CITRATE (PF) 100 MCG/2ML IJ SOLN
INTRAMUSCULAR | Status: DC | PRN
Start: 2021-12-06 — End: 2021-12-06
  Administered 2021-12-06: 25 ug via INTRAVENOUS

## 2021-12-06 MED ORDER — LIDOCAINE HCL (PF) 1 % IJ SOLN
INTRAMUSCULAR | Status: DC | PRN
  Administered 2021-12-06: 2 mL

## 2021-12-06 MED ORDER — ONDANSETRON HCL 4 MG/2ML IJ SOLN: 4.0000 mg | Freq: Four times a day (QID) | INTRAMUSCULAR | Status: DC | PRN

## 2021-12-06 MED ORDER — SODIUM CHLORIDE 0.9% FLUSH
3.0000 mL | Freq: Two times a day (BID) | INTRAVENOUS | Status: DC
  Administered 2021-12-06 – 2021-12-08 (×4): 3 mL via INTRAVENOUS

## 2021-12-06 MED ORDER — OXYCODONE HCL 5 MG PO TABS: 5.0000 mg | ORAL_TABLET | ORAL | Status: DC | PRN

## 2021-12-06 MED ORDER — VERAPAMIL HCL 2.5 MG/ML IV SOLN
INTRAVENOUS | Status: DC | PRN
  Administered 2021-12-06: 10 mL via INTRA_ARTERIAL

## 2021-12-06 MED ORDER — HEPARIN SODIUM (PORCINE) 1000 UNIT/ML IJ SOLN
INTRAMUSCULAR | Status: AC
  Filled 2021-12-06: qty 10

## 2021-12-06 MED ORDER — HEPARIN (PORCINE) IN NACL 1000-0.9 UT/500ML-% IV SOLN
INTRAVENOUS | Status: DC | PRN
  Administered 2021-12-06 (×2): 500 mL

## 2021-12-06 MED ORDER — HEPARIN (PORCINE) IN NACL 1000-0.9 UT/500ML-% IV SOLN
INTRAVENOUS | Status: AC
  Filled 2021-12-06: qty 1000

## 2021-12-06 MED ORDER — MIDAZOLAM HCL 2 MG/2ML IJ SOLN
INTRAMUSCULAR | Status: AC
  Filled 2021-12-06: qty 2

## 2021-12-06 MED ORDER — ATORVASTATIN CALCIUM 80 MG PO TABS
80.0000 mg | ORAL_TABLET | Freq: Every day | ORAL | Status: DC
  Filled 2021-12-06 (×2): qty 1

## 2021-12-06 MED ORDER — LABETALOL HCL 5 MG/ML IV SOLN: 10.0000 mg | INTRAVENOUS | Status: AC | PRN

## 2021-12-06 MED ORDER — LIDOCAINE HCL (PF) 1 % IJ SOLN
INTRAMUSCULAR | Status: AC
  Filled 2021-12-06: qty 30

## 2021-12-06 MED ORDER — SODIUM CHLORIDE 0.9 % IV SOLN: 250.0000 mL | INTRAVENOUS | Status: DC | PRN

## 2021-12-06 MED ORDER — SODIUM CHLORIDE 0.9 % IV SOLN: INTRAVENOUS | Status: AC

## 2021-12-06 MED ORDER — HEPARIN SODIUM (PORCINE) 1000 UNIT/ML IJ SOLN
INTRAMUSCULAR | Status: DC | PRN
Start: 2021-12-06 — End: 2021-12-06
  Administered 2021-12-06: 4000 [IU] via INTRAVENOUS

## 2021-12-06 MED ORDER — HYDRALAZINE HCL 20 MG/ML IJ SOLN: 10.0000 mg | INTRAMUSCULAR | Status: AC | PRN

## 2021-12-06 MED ORDER — VERAPAMIL HCL 2.5 MG/ML IV SOLN
INTRAVENOUS | Status: AC
  Filled 2021-12-06: qty 2

## 2021-12-06 MED ORDER — FENTANYL CITRATE (PF) 100 MCG/2ML IJ SOLN
INTRAMUSCULAR | Status: AC
  Filled 2021-12-06: qty 2

## 2021-12-06 MED ORDER — SODIUM CHLORIDE 0.9% FLUSH: 3.0000 mL | INTRAVENOUS | Status: DC | PRN

## 2021-12-06 SURGICAL SUPPLY — 13 items
CATH 5FR JL3.5 JR4 ANG PIG MP (CATHETERS) ×1 IMPLANT
CATH BALLN WEDGE 5F 110CM (CATHETERS) ×1 IMPLANT
DEVICE RAD COMP TR BAND LRG (VASCULAR PRODUCTS) ×1 IMPLANT
GLIDESHEATH SLEND A-KIT 6F 22G (SHEATH) ×1 IMPLANT
GUIDEWIRE INQWIRE 1.5J.035X260 (WIRE) IMPLANT
INQWIRE 1.5J .035X260CM (WIRE) ×2
KIT HEART LEFT (KITS) ×2 IMPLANT
PACK CARDIAC CATHETERIZATION (CUSTOM PROCEDURE TRAY) ×2 IMPLANT
SHEATH GLIDE SLENDER 4/5FR (SHEATH) ×1 IMPLANT
TRANSDUCER W/STOPCOCK (MISCELLANEOUS) ×2 IMPLANT
TUBING CIL FLEX 10 FLL-RA (TUBING) ×2 IMPLANT
WIRE EMERALD ST .035X150CM (WIRE) ×1 IMPLANT
WIRE HI TORQ VERSACORE-J 145CM (WIRE) ×1 IMPLANT

## 2021-12-06 NOTE — CV Procedure (Signed)
Irregularities in the proximal to mid LAD up to 40%.  Mid LAD [place] 0, 1, 1 with 0%, 70%, and 60% stenoses respectively. ?Left main widely patent. ?Circumflex is large with luminal irregularities.  First marginal with 30% proximal stenosis ?Codominant right coronary with luminal irregularities. ?Left ventriculography not performed.  LVEDP 15 mmHg. ?Mild pulmonary hypertension.  Mean PA pressure 32 mmHg, mean wedge pressure 18 mmHg, WHO group 2 pulmonary hypertension. ?Pulmonary vascular resistance 2.7 [place] units. ?Aortic stenosis with peak to peak gradient 21 mmHg mean gradient 25 mmHg.  Calculated aortic valve area 1.1 cm? ?Mitral stenosis mean transmitral gradient 11.3 mmHg and calculated valve area 1.64 cm? ?Mixed venous O2 64% and a low saturation 93%, and [place] cardiac output 5.19 L/min with cardiac index 2.83 L/min/m? ?

## 2021-12-06 NOTE — Progress Notes (Signed)
?PROGRESS NOTE ? ?Tiffany Washington Gut  ?DOB: [birth year]  ?PCP: Velna Hatchet, MD ?TKZ:[id number]  ?DOA: 12/04/2021 ? LOS: 2 days  ?Hospital Day: 3 ? ?Brief narrative: ?Tiffany Washington is a 81 y.o. female with PMH significant for HTN, HLD, moderate aortic stenosis, left basal ganglia hemorrhage, CAD, GERD. ?Patient presented to the ED on 5/7 with complaint of worsening shortness of breath even on rest.  Also complains of orthopnea, progressively worsening bilateral lower extremity edema.  ? ?In the ED, she was afebrile, hemodynamically stable but O2 sat was 83% on room air and required 2 L per nasal collar. ?Chest x-ray showed pulmonary congestion/edema ?She was given DuoNeb and IV Lasix 60 mg. ?Admitted to hospitalist service. ? ?Subjective: ?Patient was seen and examined this morning.  She was waiting for cardiac cath.  He wants to proceed with TAVR for aortic stenosis ?Feels better.  Skin wrinkling seen in the legs suggestive of improving swelling. ? ?Principal Problem: ?  Acute respiratory failure with hypoxia ([place]) ?Active Problems: ?  Dyslipidemia ?  Coronary artery disease involving native coronary artery of native heart without angina pectoris ?  Essential hypertension ?  Cerebrovascular accident (CVA) ([place]) ?  CHF (congestive heart failure) ([place]) ?  Hypokalemia ?  ? ? ?Assessment and Plan: ?New onset CHF exacerbation ?Severe aortic stenosis ?Essential hypertension ?-Patient presented with progressive worsening dyspnea, orthopnea, bilateral lower extremity edema.   ?Echocardiogram with EF more than 57%, grade 2 diastolic dysfunction, elevated right ventricle systolic pressure over 75 and also showed moderate to severe aortic valve stenosis. ?-Currently appropriately diuresing with IV Lasix 40 mg daily.  Keep lisinopril on hold ?-Cardiology consultation appreciated.  Planned for cardiac cath today in progression of TAVR. ? ?Coronary artery disease  ?-Stable. Had negative Lexiscan Myoview in 2019. ? ?Acute  respiratory failure with hypoxia ([place]) ?-Initially required supplemental oxygen.   ?-Currently breathing on room air at rest. ?   ?Hypokalemia ?-repleted with oral potassium  ?Recent Labs  ?Lab 12/04/21 ?1629 12/05/21 ?0153 12/06/21 ?0153  ?K 3.4* 3.9 3.6  ?MG 2.0  --   --   ? ?Cerebrovascular accident  ?HLD ?-Continue statin ? ?Restless leg syndrome ?-Continue Mirapex ?   ?Goals of care ?  Code Status: Full Code  ? ? ?Mobility: Uses a cane at home.  Encourage ambulation ? ?Skin assessment:  ?  ? ?Nutritional status:  ?Body mass index is 29.46 kg/m?.  ?  ?  ? ? ? ? ?Diet:  ?Diet Order   ? ?       ?  Diet NPO time specified Except for: Sips with Meds  Diet effective midnight       ?  ? ?  ?  ? ?  ? ? ?DVT prophylaxis:  ?enoxaparin (LOVENOX) injection 40 mg Start: 12/05/21 1000 ?  ?Antimicrobials: None ?Fluid: None ?Consultants: Cardiology  ?Family Communication: Family at bedside ? ?Status is: Inpatient ? ?Continue in-hospital care because: Cardiac cath today.  Pending TAVR ?Level of care: Telemetry Cardiac  ? ?Dispo: The patient is from: Home ?             Anticipated d/c is to: Depend on clinical course ?             Patient currently is not medically stable to d/c. ?  Difficult to place patient No ? ? ? ? ?Infusions:  ? sodium chloride    ? sodium chloride 10 mL/hr at 12/06/21 0554  ? ? ?Scheduled Meds: ? enoxaparin (  LOVENOX) injection  40 mg Subcutaneous Q24H  ? pramipexole  0.25 mg Oral 2 times per day  ? rosuvastatin  20 mg Oral QHS  ? sodium chloride flush  3 mL Intravenous Q12H  ? ? ?PRN meds: ?  ? ?Antimicrobials: ?Anti-infectives (From admission, onward)  ? ? None  ? ?  ? ? ?Objective: ?Vitals:  ? 12/06/21 0539 12/06/21 1200  ?BP: 123/66 119/72  ?Pulse: 68 75  ?Resp: 18   ?Temp: 98.6 ?F (37 ?C) 98.7 ?F (37.1 ?C)  ?SpO2: 91%   ? ? ?Intake/Output Summary (Last 24 hours) at 12/06/2021 1511 ?Last data filed at 12/06/2021 1400 ?Gross per 24 hour  ?Intake 3 ml  ?Output 700 ml  ?Net -697 ml  ? ?Filed Weights  ?  12/04/21 2051 12/05/21 0452 12/06/21 0539  ?Weight: 79.1 kg 79 kg 77.8 kg  ? ?Weight change: -0.181 kg ?Body mass index is 29.46 kg/m?.  ? ?Physical Exam: ?General exam: Pleasant, elderly Caucasian female.  Not in distress at this time ?Skin: No rashes, lesions or ulcers. ?HEENT: Atraumatic, normocephalic, no obvious bleeding ?Lungs: Clear to auscultation bilaterally ?CVS: Regular rate and rhythm, strong ejection systolic murmur ?GI/Abd soft, nontender, nondistended, bowel sound present ?CNS: Alert, awake, oriented x3 ?Psychiatry: Mood appropriate ?Extremities: Trace bilateral pedal edema, no calf tenderness ? ?Data Review: I have personally reviewed the laboratory data and studies available. ? ?F/u labs ordered ?Unresulted Labs (From admission, onward)  ? ?  Start     Ordered  ? 12/06/21 2423  Basic metabolic panel  Daily,   R     ?Question:  Specimen collection method  Answer:  Lab=Lab collect  ? 12/05/21 1505  ? ?  ?  ? ?  ? ? ?Signed, ?Terrilee Croak, MD ?[facility] ?12/06/2021 ? ? ? ? ? ? ? ? ? ? ? ? ?

## 2021-12-06 NOTE — Interval H&P Note (Signed)
Cath Lab Visit (complete for each Cath Lab visit) ? ?Clinical Evaluation Leading to the Procedure:  ? ?ACS: No. ? ?Non-ACS:   ? ?Anginal Classification: CCS [place] ? ?Anti-ischemic medical therapy: Minimal Therapy (1 class of medications) ? ?Non-Invasive Test Results: High-risk stress test findings: cardiac mortality >3%/year ? ?Prior CABG: No previous CABG ? ? ? ? ? ?History and Physical Interval Note: ? ?12/06/2021 ?4:46 PM ? ?Tiffany Washington  has presented today for surgery, with the diagnosis of aortic stenosis - mr.  The various methods of treatment have been discussed with the patient and family. After consideration of risks, benefits and other options for treatment, the patient has consented to  Procedure(s): ?RIGHT/LEFT HEART CATH AND CORONARY ANGIOGRAPHY (N/A) as a surgical intervention.  The patient's history has been reviewed, patient examined, no change in status, stable for surgery.  I have reviewed the patient's chart and labs.  Questions were answered to the patient's satisfaction.   ? ? ?Tiffany Washington ? ? ?

## 2021-12-06 NOTE — Consult Note (Addendum)
?[facility]   ?MULTIDISCIPLINARY HEART VALVE TEAM ? ?Cardiology Consultation:  ? ?Patient ID: Tiffany Washington ?MRN: [id number]; DOB: Oct 06, 1940 ? ?Admit date: 12/04/2021 ?Date of Consult: 12/06/2021 ? ?Primary Care Provider: Velna Hatchet, MD ?[facility] Cardiologist: Minus Breeding, MD  ? ?Patient Profile:  ? ?Tiffany Washington is a 81 y.o. female with a hx of intracranial hemorrhage 12/2020 felt hypertensive in etiology due to territory, mild carotid artery disease (7-82% R/LICA), coronary calcification on chest imaging, HTN, arthritis, and previously documented moderate AS/mild AI/mild MR who was admitted to [facility] 12/04/21 with acute CHF and is now being seen for the evaluation severe aortic stenosis at the request of Dr. Acie Fredrickson. ? ?History of Present Illness:  ? ?Tiffany Washington is is an 81yo F who lives in [place], [place] with her husband of 16 years. She is retired from a Special educational needs teacher for 25 years. They have two adult children and three grandchildren who live near. She is one of 8 children and reports that 5 of her siblings have heart disease, mostly with CABG and stenting. She is able to perform ADL/IADLs without issues. She ambulates with a cane inside and outside of her home, mainly for stability due to a hx of recent back surgery. She has full upper dentures and dental caps on lowers with no active dental issues. She states that she had been in her usual state of health until about a month ago when she began having mild exertional SOB with bilateral lower extremity swelling. Her symptoms continued to worsen until she presented to [facility] 12/04/21 with a three day hx of SOB, orthopnea, cough, and edema. On ED presentation she was hypoxic at 83% on RA. She was treated with nebs and IV Lasix. CXR showed pulmonary vascular congestion with pulmonary edema, small effusions, and bilateral lower lung opacities and atelectasis. BNP 306. Echocardiogram showed a hyperdynamic LV at >75% with G2DD, splay artifact with  degenerative MV, moderate to severe MR, moderate MS, moderate TR, mild AI, and moderate to severe aortic stenosis which appeared to have progressed since prior echocardiogram. She was able to wean off O2 with diuresis by cardiology consultation. Plan was to proceed with [facility] and structural heart consultation.  ? ?She has been followed by Dr. Percival Spanish for her cardiology care, initially seen 06/2018 for the evaluation of coronary calcification on CT imaging in the LM with 3 vessel involvement. Echocardiogram at that time showed mild aortic stenosis. She was further evaluated with Lexiscan stress testing. This showed no evidence of ischemia or infarct. Repeat echocardiogram 09/2019 continued to show mild AS with a peak gradient 54mHg and a mean at 132mg. AVA by VTI measured 1.38cm2. By 08/2020 her murmur was noted to be more pronounced with a later peaking systolic murmur. Echo at that time showed a normal LVEF and some progression in her valvular disease. Mean gradient noted to be 2535m with a peak at 42.8mm13mand an AVA at 0.97cm2. Unfortunately she was then  admitted 12/2020 with a basal ganglia ICH felt due to hypertension. She was taken off ASA at that time and her antihypertensives were adjusted. Head CT angio was negative for abnormal cerebral vascularity. ? ?In most recent follow up with Dr. HochPercival Spanishe was noted to be stable with no new symptoms. Plan was to repeat her echocardiogram 04/2022.  ? ?On my exam today, she is resting comfortably in her bed without any acute issues. She is no longer on supplemental oxygen and appears euvolemic on exam. She  denies chest pain, palpitations, , no new neuro changes, no dizziness, or syncope.  ? ?Past Medical History:  ?Diagnosis Date  ? Aortic insufficiency   ? Aortic stenosis   ? Mild  ? Arthritis   ? Essential hypertension   ? GERD (gastroesophageal reflux disease)   ? mild  ? HOH (hard of hearing)   ? ICH (intracerebral hemorrhage) ([place])   ? 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 Mild carotid artery disease ([place])   ? Mitral regurgitation   ? Restless leg syndrome   ? Spondylolisthesis of lumbar region   ? ? ?Past Surgical History:  ?Procedure Laterality Date  ? BACK SURGERY  10/2019  ? CESAREAN SECTION    ? COLONOSCOPY    ? COLONOSCOPY W/ BIOPSIES AND POLYPECTOMY    ? KNEE SURGERY Bilateral   ? Bilateral  ? MULTIPLE TOOTH EXTRACTIONS    ? POLYPECTOMY    ? UPPER GASTROINTESTINAL ENDOSCOPY    ? VAGINAL HYSTERECTOMY    ?  ? ?Home Medications:  ?Prior to Admission medications   ?Medication Sig Start Date End Date Taking? Authorizing Provider  ?acetaminophen (TYLENOL) 325 MG tablet Take 650 mg by mouth every 6 (six) hours as needed for moderate pain or headache.   Yes [provider]  ?Ascorbic Acid (VITAMIN C) 1000 MG tablet Take 1,000 mg by mouth daily.   Yes [provider]  ?B Complex Vitamins (B COMPLEX 100 PO) Take 1 tablet by mouth daily.   Yes [provider]  ?lisinopril (ZESTRIL) 10 MG tablet Take 1 tablet (10 mg total) by mouth daily. 01/08/21  Yes Einar Pheasant, NP  ?Multiple Vitamins-Minerals (MULTIVITAMIN WITH MINERALS) tablet Take 1 tablet by mouth daily.    Yes [provider]  ?pramipexole (MIRAPEX) 0.25 MG tablet Take 0.25 mg by mouth 2 (two) times daily.    Yes [provider]  ?rosuvastatin (CRESTOR) 20 MG tablet Take 1 tablet (20 mg total) by mouth daily. 01/07/21  Yes Einar Pheasant, NP  ?Zinc 30 MG CAPS Take 30 mg by mouth daily.   Yes [provider]  ? ? ?Inpatient Medications: ?Scheduled Meds: ? enoxaparin (LOVENOX) injection  40 mg Subcutaneous Q24H  ? pramipexole  0.25 mg Oral 2 times per day  ? rosuvastatin  20 mg Oral QHS  ? sodium chloride flush  3 mL Intravenous Q12H  ? ?Continuous Infusions: ? sodium chloride    ? sodium chloride 10 mL/hr at 12/06/21 0554  ? ?PRN Meds: ?sodium chloride, sodium chloride flush ? ?Allergies:    ?Allergies  ?Allergen Reactions  ? Carbamazepine  Other (See Comments)  ?  Doesn't remember reaction  ? Ciprofloxacin Other (See Comments)  ?  Patient stated she "cannot take this"  ? Diclofenac Other (See Comments)  ?  Reaction not recalled  ? Indomethacin Other (See Comments)  ?  Reaction not recalled  ? Meloxicam Other (See Comments)  ?  Reaction not recalled  ? Nortriptyline Other (See Comments)  ?  Reaction not recalled  ? Voltaren [Diclofenac Sodium] Other (See Comments)  ?  Reaction not recalled  ? Gabapentin Other (See Comments)  ?  Makes the patient unable to sleep  ? Pamelor [Nortriptyline Hcl] Other (See Comments)  ?  insomnia  ? Ropinirole Other (See Comments)  ?  Speed up the restless legs  ? Sulfa Antibiotics Diarrhea and Nausea And Vomiting  ? ? ?Social History:   ?Social History  ? ?  Socioeconomic History  ? Marital status: Married  ?  Spouse name: Karlton Lemon  ? Number of children: 2  ? Years of education: Not on file  ? Highest education level: High school graduate  ?Occupational History  ? Not on file  ?Tobacco Use  ? Smoking status: Never  ? Smokeless tobacco: Never  ?Vaping Use  ? Vaping Use: Never used  ?Substance and Sexual Activity  ? Alcohol use: No  ? Drug use: No  ? Sexual activity: Not on file  ?Other Topics Concern  ? Not on file  ?Social History Narrative  ? Working on a farm  ? 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  ? She is married for 52 years and they have two children.  ? ?Social Determinants of Health  ? ?Financial Resource Strain: Not on file  ?Food Insecurity: Not on file  ?Transportation Needs: Not on file  ?Physical Activity: Not on file  ?Stress: Not on file  ?Social Connections: Not on file  ?Intimate Partner Violence: Not on file  ?  ?Family History:   ? ?Family History  ?Problem Relation Age of Onset  ? Hypertension Mother   ? Congestive Heart Failure Mother   ?     Died, 68  ? Heart failure Mother   ? Pulmonary embolism Father   ?     Died, 81  ? Diabetes Sister   ? CAD Brother 82  ?     CABG  ? Diabetes Brother   ?  Gout Son   ?  ?ROS:  ?Please see the history of present illness.  ? ?All other ROS reviewed and negative.    ? ?Physical Exam/Data:  ? ?Vitals:  ? 12/05/21 0452 12/05/21 1715 12/05/21 1955 12/06/21 0539  ?B

## 2021-12-06 NOTE — Progress Notes (Addendum)
? ?Progress Note ? ?Patient Name: Tiffany Washington ?Date of Encounter: 12/06/2021 ? ?[facility] Cardiologist: Minus Breeding, MD  ? ?Subjective  ? ?Patient is doing well this AM. Denies any chest pain, palpitations, sob, ankle swelling. Is ready for her cath today, does not have any questions about the procedure.  ? ?Inpatient Medications  ?  ?Scheduled Meds: ? enoxaparin (LOVENOX) injection  40 mg Subcutaneous Q24H  ? pramipexole  0.25 mg Oral 2 times per day  ? rosuvastatin  20 mg Oral QHS  ? sodium chloride flush  3 mL Intravenous Q12H  ? ?Continuous Infusions: ? sodium chloride    ? sodium chloride 10 mL/hr at 12/06/21 0554  ? ?PRN Meds: ?sodium chloride, sodium chloride flush  ? ?Vital Signs  ?  ?Vitals:  ? 12/05/21 0452 12/05/21 1715 12/05/21 1955 12/06/21 0539  ?BP: 108/67 107/62 113/61 123/66  ?Pulse: 78 76 85 68  ?Resp: '18 16 17 18  '$ ?Temp: 98.1 ?F (36.7 ?C) 99 ?F (37.2 ?C) 99.1 ?F (37.3 ?C) 98.6 ?F (37 ?C)  ?TempSrc: Oral Oral Oral Oral  ?SpO2: 92% 95% 93% 91%  ?Weight: 79 kg   77.8 kg  ?Height:      ? ? ?Intake/Output Summary (Last 24 hours) at 12/06/2021 0849 ?Last data filed at 12/06/2021 0539 ?Gross per 24 hour  ?Intake 723 ml  ?Output 1850 ml  ?Net -1127 ml  ? ? ?  12/06/2021  ?  5:39 AM 12/05/2021  ?  4:52 AM 12/04/2021  ?  8:51 PM  ?Last 3 Weights  ?Weight (lbs) 171 lb 9.6 oz 174 lb 1.6 oz 174 lb 4.8 oz  ?Weight (kg) 77.837 kg 78.971 kg 79.062 kg  ?   ? ?Telemetry  ?  ?Sinus rhythm - Personally Reviewed ? ?ECG  ?  ?No new tracings since 5/7 - Personally Reviewed ? ?Physical Exam  ? ?GEN: No acute distress. Standing next to her bed using walker  ?Neck: No JVD. Supple  ?Cardiac: RRR, grade 4/6 systolic murmur heard throughout. Radial pulses 2+ bilaterally ?Respiratory: Clear to auscultation bilaterally. No increased WOB  ?GI: Soft, nontender, non-distended  ?MS: No edema; No deformity. ?Neuro:  Nonfocal  ?Psych: Normal affect  ? ?Labs  ?  ?High Sensitivity Troponin:   ?Recent Labs  ?Lab 12/04/21 ?1629  12/04/21 ?1820  ?TROPONINIHS 14 17  ?   ?Chemistry ?Recent Labs  ?Lab 12/04/21 ?1629 12/05/21 ?0153 12/06/21 ?0153  ?NA 137 138 136  ?K 3.4* 3.9 3.6  ?CL 108 106 103  ?CO2 21* 23 24  ?GLUCOSE 104* 125* 101*  ?BUN '12 13 21  '$ ?CREATININE 0.72 0.74 0.70  ?CALCIUM 9.2 8.9 9.1  ?MG 2.0  --   --   ?PROT  --   --  6.5  ?ALBUMIN  --   --  3.2*  ?AST  --   --  22  ?ALT  --   --  21  ?ALKPHOS  --   --  26  ?BILITOT  --   --  1.0  ?GFRNONAA >60 >60 >60  ?ANIONGAP '8 9 9  '$ ?  ?Lipids  ?Recent Labs  ?Lab 12/06/21 ?0153  ?CHOL 111  ?TRIG 107  ?HDL 38*  ?Colo 52  ?CHOLHDL 2.9  ?  ?Hematology ?Recent Labs  ?Lab 12/04/21 ?1629 12/05/21 ?0153 12/06/21 ?0153  ?WBC 10.8* 9.7 8.5  ?RBC 3.88 3.74* 3.79*  ?HGB 12.6 11.9* 12.3  ?HCT 37.2 36.1 36.1  ?MCV 95.9 96.5 95.3  ?MCH 32.5 31.8 32.5  ?  MCHC 33.9 33.0 34.1  ?RDW 13.2 13.2 13.2  ?PLT 246 245 250  ? ?Thyroid  ?Recent Labs  ?Lab 12/06/21 ?0153  ?TSH 3.141  ?  ?BNP ?Recent Labs  ?Lab 12/04/21 ?1629  ?BNP 306.9*  ?  ?DDimer No results for input(s): DDIMER in the last 168 hours.  ? ?Radiology  ?  ?DG Chest 2 View ? ?Result Date: 12/04/2021 ?CLINICAL DATA:  Shortness of breath and extremity edema. EXAM: CHEST - 2 VIEW COMPARISON:  None Available. FINDINGS: Pulmonary vascular congestion with mixed interstitial airspace opacities bilaterally are noted compatible with pulmonary edema. Bilateral LOWER lung opacities/atelectasis noted. Small bilateral pleural effusions are present. The cardiomediastinal silhouette is grossly unremarkable. Probable compression fractures of a LOWER thoracic vertebra and L1 IMPRESSION: 1. Pulmonary vascular congestion with pulmonary edema and small effusions. 2. Bilateral LOWER lung opacities/atelectasis. Electronically Signed   By: Margarette Canada M.D.   On: 12/04/2021 17:52  ? ?ECHOCARDIOGRAM COMPLETE ? ?Result Date: 12/05/2021 ?   ECHOCARDIOGRAM REPORT   Patient Name:   Tiffany Washington Date of Exam: 12/05/2021 Medical Rec #:  [id number]        Height:       64.0 in  Accession #:    [id number]       Weight:       174.1 lb Date of Birth:  [birth year]         BSA:          1.844 m? Patient Age:    81 years         BP:           108/67 mmHg Patient Gender: F                HR:           83 bpm. Exam Location:  Inpatient Procedure: 2D Echo, Cardiac Doppler and Color Doppler Indications:    CHF- Acute systolic  History:        Patient has prior history of Echocardiogram examinations, most                 recent 05/03/2021. CHF, CAD; Risk Factors:Hypertension.  Sonographer:    Jefferey Pica Referring Phys: [id number]  1. Left ventricular ejection fraction, by estimation, is >75%. The left ventricle has hyperdynamic function. The left ventricle has no regional wall motion abnormalities. Left ventricular diastolic parameters are consistent with Grade II diastolic dysfunction (pseudonormalization).  2. Right ventricular systolic function is normal. The right ventricular size is normal. There is severely elevated pulmonary artery systolic pressure. The estimated right ventricular systolic pressure is 53.6 mmHg.  3. Left atrial size was moderately dilated.  4. Splay artifact. The mitral valve is degenerative. Moderate to severe mitral valve regurgitation. Moderate mitral stenosis. The mean mitral valve gradient is 8.0 mmHg. Severe mitral annular calcification.  5. Tricuspid valve regurgitation is moderate.  6. The aortic valve is calcified. There is moderate calcification of the aortic valve. There is moderate thickening of the aortic valve. Aortic valve regurgitation is mild. Moderate to severe aortic valve stenosis. Aortic regurgitation PHT measures 426 msec. Aortic valve area, by VTI measures 0.82 cm?Marland Kitchen Aortic valve mean gradient measures 33.2 mmHg. Aortic valve Vmax measures 3.96 m/s.  7. The inferior vena cava is normal in size with greater than 50% respiratory variability, suggesting right atrial pressure of 3 mmHg. Comparison(s): Aortic stenosis and mitral stenosis  have increased. FINDINGS  Left Ventricle: Left ventricular ejection fraction, by estimation,  is >75%. The left ventricle has hyperdynamic function. The left ventricle has no regional wall motion abnormalities. The left ventricular internal cavity size was normal in size. There is no left ventricular hypertrophy. Left ventricular diastolic parameters are consistent with Grade II diastolic dysfunction (pseudonormalization). Right Ventricle: The right ventricular size is normal. No increase in right ventricular wall thickness. Right ventricular systolic function is normal. There is severely elevated pulmonary artery systolic pressure. The tricuspid regurgitant velocity is 4.02 m/s, and with an assumed right atrial pressure of 10 mmHg, the estimated right ventricular systolic pressure is 19.6 mmHg. Left Atrium: Left atrial size was moderately dilated. Right Atrium: Right atrial size was normal in size. Pericardium: There is no evidence of pericardial effusion. Mitral Valve: Splay artifact. The mitral valve is degenerative in appearance. There is moderate thickening of the mitral valve leaflet(s). There is moderate calcification of the mitral valve leaflet(s). Severe mitral annular calcification. Moderate to severe mitral valve regurgitation, with eccentric medially directed jet. Moderate mitral valve stenosis. MV peak gradient, 30.2 mmHg. The mean mitral valve gradient is 8.0 mmHg. Tricuspid Valve: The tricuspid valve is normal in structure. Tricuspid valve regurgitation is moderate . No evidence of tricuspid stenosis. Aortic Valve: The aortic valve is calcified. There is moderate calcification of the aortic valve. There is moderate thickening of the aortic valve. Aortic valve regurgitation is mild. Aortic regurgitation PHT measures 426 msec. Moderate to severe aortic stenosis is present. Aortic valve mean gradient measures 33.2 mmHg. Aortic valve peak gradient measures 62.7 mmHg. Aortic valve area, by VTI measures 0.82  cm?. Pulmonic Valve: The pulmonic valve was normal in structure. Pulmonic valve regurgitation is trivial. No evidence of pulmonic stenosis. Aorta: The aortic root is normal in size and structure. Venous: The infer

## 2021-12-06 NOTE — H&P (View-Only) (Signed)
?[facility]   ?MULTIDISCIPLINARY HEART VALVE TEAM ? ?Cardiology Consultation:  ? ?Patient ID: Tiffany Washington ?MRN: [id number]; DOB: 12-05-1940 ? ?Admit date: 12/04/2021 ?Date of Consult: 12/06/2021 ? ?Primary Care Provider: Velna Hatchet, MD ?[facility] Cardiologist: Minus Breeding, MD  ? ?Patient Profile:  ? ?Tiffany Washington is a 81 y.o. female with a hx of intracranial hemorrhage 12/2020 felt hypertensive in etiology due to territory, mild carotid artery disease (4-09% R/LICA), coronary calcification on chest imaging, HTN, arthritis, and previously documented moderate AS/mild AI/mild MR who was admitted to [facility] 12/04/21 with acute CHF and is now being seen for the evaluation severe aortic stenosis at the request of Dr. Acie Fredrickson. ? ?History of Present Illness:  ? ?Tiffany Washington is is an 81yo F who lives in [place], [place] with her husband of 22 years. She is retired from a Special educational needs teacher for 25 years. They have two adult children and three grandchildren who live near. She is one of 8 children and reports that 5 of her siblings have heart disease, mostly with CABG and stenting. She is able to perform ADL/IADLs without issues. She ambulates with a cane inside and outside of her home, mainly for stability due to a hx of recent back surgery. She has full upper dentures and dental caps on lowers with no active dental issues. She states that she had been in her usual state of health until about a month ago when she began having mild exertional SOB with bilateral lower extremity swelling. Her symptoms continued to worsen until she presented to [facility] 12/04/21 with a three day hx of SOB, orthopnea, cough, and edema. On ED presentation she was hypoxic at 83% on RA. She was treated with nebs and IV Lasix. CXR showed pulmonary vascular congestion with pulmonary edema, small effusions, and bilateral lower lung opacities and atelectasis. BNP 306. Echocardiogram showed a hyperdynamic LV at >75% with G2DD, splay artifact with  degenerative MV, moderate to severe MR, moderate MS, moderate TR, mild AI, and moderate to severe aortic stenosis which appeared to have progressed since prior echocardiogram. She was able to wean off O2 with diuresis by cardiology consultation. Plan was to proceed with [facility] and structural heart consultation.  ? ?She has been followed by Dr. Percival Spanish for her cardiology care, initially seen 06/2018 for the evaluation of coronary calcification on CT imaging in the LM with 3 vessel involvement. Echocardiogram at that time showed mild aortic stenosis. She was further evaluated with Lexiscan stress testing. This showed no evidence of ischemia or infarct. Repeat echocardiogram 09/2019 continued to show mild AS with a peak gradient 25mHg and a mean at 169mg. AVA by VTI measured 1.38cm2. By 08/2020 her murmur was noted to be more pronounced with a later peaking systolic murmur. Echo at that time showed a normal LVEF and some progression in her valvular disease. Mean gradient noted to be 253m with a peak at 42.8mm7mand an AVA at 0.97cm2. Unfortunately she was then  admitted 12/2020 with a basal ganglia ICH felt due to hypertension. She was taken off ASA at that time and her antihypertensives were adjusted. Head CT angio was negative for abnormal cerebral vascularity. ? ?In most recent follow up with Dr. HochPercival Spanishe was noted to be stable with no new symptoms. Plan was to repeat her echocardiogram 04/2022.  ? ?On my exam today, she is resting comfortably in her bed without any acute issues. She is no longer on supplemental oxygen and appears euvolemic on exam. She  denies chest pain, palpitations, , no new neuro changes, no dizziness, or syncope.  ? ?Past Medical History:  ?Diagnosis Date  ? Aortic insufficiency   ? Aortic stenosis   ? Mild  ? Arthritis   ? Essential hypertension   ? GERD (gastroesophageal reflux disease)   ? mild  ? HOH (hard of hearing)   ? ICH (intracerebral hemorrhage) ([place])   ? 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 Mild carotid artery disease ([place])   ? Mitral regurgitation   ? Restless leg syndrome   ? Spondylolisthesis of lumbar region   ? ? ?Past Surgical History:  ?Procedure Laterality Date  ? BACK SURGERY  10/2019  ? CESAREAN SECTION    ? COLONOSCOPY    ? COLONOSCOPY W/ BIOPSIES AND POLYPECTOMY    ? KNEE SURGERY Bilateral   ? Bilateral  ? MULTIPLE TOOTH EXTRACTIONS    ? POLYPECTOMY    ? UPPER GASTROINTESTINAL ENDOSCOPY    ? VAGINAL HYSTERECTOMY    ?  ? ?Home Medications:  ?Prior to Admission medications   ?Medication Sig Start Date End Date Taking? Authorizing Provider  ?acetaminophen (TYLENOL) 325 MG tablet Take 650 mg by mouth every 6 (six) hours as needed for moderate pain or headache.   Yes [provider]  ?Ascorbic Acid (VITAMIN C) 1000 MG tablet Take 1,000 mg by mouth daily.   Yes [provider]  ?B Complex Vitamins (B COMPLEX 100 PO) Take 1 tablet by mouth daily.   Yes [provider]  ?lisinopril (ZESTRIL) 10 MG tablet Take 1 tablet (10 mg total) by mouth daily. 01/08/21  Yes Einar Pheasant, NP  ?Multiple Vitamins-Minerals (MULTIVITAMIN WITH MINERALS) tablet Take 1 tablet by mouth daily.    Yes [provider]  ?pramipexole (MIRAPEX) 0.25 MG tablet Take 0.25 mg by mouth 2 (two) times daily.    Yes [provider]  ?rosuvastatin (CRESTOR) 20 MG tablet Take 1 tablet (20 mg total) by mouth daily. 01/07/21  Yes Einar Pheasant, NP  ?Zinc 30 MG CAPS Take 30 mg by mouth daily.   Yes [provider]  ? ? ?Inpatient Medications: ?Scheduled Meds: ? enoxaparin (LOVENOX) injection  40 mg Subcutaneous Q24H  ? pramipexole  0.25 mg Oral 2 times per day  ? rosuvastatin  20 mg Oral QHS  ? sodium chloride flush  3 mL Intravenous Q12H  ? ?Continuous Infusions: ? sodium chloride    ? sodium chloride 10 mL/hr at 12/06/21 0554  ? ?PRN Meds: ?sodium chloride, sodium chloride flush ? ?Allergies:    ?Allergies  ?Allergen Reactions  ? Carbamazepine  Other (See Comments)  ?  Doesn't remember reaction  ? Ciprofloxacin Other (See Comments)  ?  Patient stated she "cannot take this"  ? Diclofenac Other (See Comments)  ?  Reaction not recalled  ? Indomethacin Other (See Comments)  ?  Reaction not recalled  ? Meloxicam Other (See Comments)  ?  Reaction not recalled  ? Nortriptyline Other (See Comments)  ?  Reaction not recalled  ? Voltaren [Diclofenac Sodium] Other (See Comments)  ?  Reaction not recalled  ? Gabapentin Other (See Comments)  ?  Makes the patient unable to sleep  ? Pamelor [Nortriptyline Hcl] Other (See Comments)  ?  insomnia  ? Ropinirole Other (See Comments)  ?  Speed up the restless legs  ? Sulfa Antibiotics Diarrhea and Nausea And Vomiting  ? ? ?Social History:   ?Social History  ? ?  Socioeconomic History  ? Marital status: Married  ?  Spouse name: Karlton Lemon  ? Number of children: 2  ? Years of education: Not on file  ? Highest education level: High school graduate  ?Occupational History  ? Not on file  ?Tobacco Use  ? Smoking status: Never  ? Smokeless tobacco: Never  ?Vaping Use  ? Vaping Use: Never used  ?Substance and Sexual Activity  ? Alcohol use: No  ? Drug use: No  ? Sexual activity: Not on file  ?Other Topics Concern  ? Not on file  ?Social History Narrative  ? Working on a farm  ? 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  ? She is married for 52 years and they have two children.  ? ?Social Determinants of Health  ? ?Financial Resource Strain: Not on file  ?Food Insecurity: Not on file  ?Transportation Needs: Not on file  ?Physical Activity: Not on file  ?Stress: Not on file  ?Social Connections: Not on file  ?Intimate Partner Violence: Not on file  ?  ?Family History:   ? ?Family History  ?Problem Relation Age of Onset  ? Hypertension Mother   ? Congestive Heart Failure Mother   ?     Died, 68  ? Heart failure Mother   ? Pulmonary embolism Father   ?     Died, 81  ? Diabetes Sister   ? CAD Brother 14  ?     CABG  ? Diabetes Brother   ?  Gout Son   ?  ?ROS:  ?Please see the history of present illness.  ? ?All other ROS reviewed and negative.    ? ?Physical Exam/Data:  ? ?Vitals:  ? 12/05/21 0452 12/05/21 1715 12/05/21 1955 12/06/21 0539  ?B

## 2021-12-07 ENCOUNTER — Inpatient Hospital Stay (HOSPITAL_COMMUNITY): Payer: PPO

## 2021-12-07 ENCOUNTER — Encounter (HOSPITAL_COMMUNITY): Payer: Self-pay | Admitting: Interventional Cardiology

## 2021-12-07 DIAGNOSIS — I350 Nonrheumatic aortic (valve) stenosis: Secondary | ICD-10-CM

## 2021-12-07 DIAGNOSIS — J9601 Acute respiratory failure with hypoxia: Secondary | ICD-10-CM | POA: Diagnosis not present

## 2021-12-07 LAB — BASIC METABOLIC PANEL
Anion gap: 9 (ref 5–15)
BUN: 20 mg/dL (ref 8–23)
CO2: 23 mmol/L (ref 22–32)
Calcium: 9 mg/dL (ref 8.9–10.3)
Chloride: 106 mmol/L (ref 98–111)
Creatinine, Ser: 0.74 mg/dL (ref 0.44–1.00)
GFR, Estimated: >60 mL/min (ref >60)
Glucose, Bld: 88 mg/dL (ref 70–99)
Potassium: 3.7 mmol/L (ref 3.5–5.1)
Sodium: 138 mmol/L (ref 135–145)

## 2021-12-07 MED ORDER — METOPROLOL TARTRATE 5 MG/5ML IV SOLN
5.0000 mg | Freq: Once | INTRAVENOUS | Status: AC
  Administered 2021-12-07: 5 mg via INTRAVENOUS

## 2021-12-07 MED ORDER — ROSUVASTATIN CALCIUM 20 MG PO TABS
20.0000 mg | ORAL_TABLET | Freq: Every day | ORAL | Status: DC
  Administered 2021-12-07 – 2021-12-08 (×2): 20 mg via ORAL
  Filled 2021-12-07 (×2): qty 1

## 2021-12-07 MED ORDER — METOPROLOL TARTRATE 5 MG/5ML IV SOLN
INTRAVENOUS | Status: AC
Start: 2021-12-07 — End: 2021-12-08
  Filled 2021-12-07: qty 5

## 2021-12-07 MED ORDER — METOPROLOL TARTRATE 50 MG PO TABS
50.0000 mg | ORAL_TABLET | Freq: Once | ORAL | Status: AC
  Administered 2021-12-07: 50 mg via ORAL
  Filled 2021-12-07: qty 1

## 2021-12-07 MED ORDER — IOHEXOL 350 MG/ML SOLN
100.0000 mL | Freq: Once | INTRAVENOUS | Status: AC | PRN
  Administered 2021-12-07: 100 mL via INTRAVENOUS

## 2021-12-07 NOTE — Progress Notes (Signed)
?  [facility]   ?MULTIDISCIPLINARY HEART VALVE TEAM  ? ?Patient underwent pre TAVR [facility] yesterday afternoon. Cr stable this AM at 0.74. Plan to continue with TAVR workup with CT imaging today. Orders placed.  ? ? ?[name] NP-C ?Structural Heart Team  ?Pager: [phone] ?Phone: [phone] ? ?

## 2021-12-07 NOTE — Progress Notes (Signed)
?PROGRESS NOTE ? ? ? ?Tiffany [place]  PYK:[id number] DOB: Mar 26, 1941 DOA: 12/04/2021 ?PCP: Velna Hatchet, MD  ?80/F with history of hypertension, intracranial hemorrhage 6/22, mild carotid artery disease, coronary calcification, moderate aortic stenosisPresented to the ED with dyspnea on exertion, orthopnea, lower extremity edema, in the ED she was hypoxic, chest x-ray noted pulmonary edema, vascular congestion ? ? ?Subjective: ?Feels much better overall ? ?Assessment and Plan: ? ?Acute diastolic CHF ?Severe pulmonary hypertension ?Acute respiratory failure with hypoxia ([place]) ?-Echo in 5/23 noted EF> 53%, grade 2 diastolic dysfunction, severe PAH moderate to severe aortic stenosis moderate mitral stenosis, moderate to severe MR ?-Left heart cath 5/9 noted moderate nonobstructive CAD ?-Clinically appears euvolemic now, IV diuretics on hold, resume low-dose Lasix at discharge ? ?Moderate to severe aortic stenosis ?Moderate to severe MR ?Moderate mitral stenosis ?-Cardiology following, TAVR work-up ongoing ?-Cath 5/9 with nonobstructive CAD ?-TAVR CT imaging today ? ?Hypokalemia ?repleted  ? ?Cerebrovascular accident (CVA) ([place]) ?Continue statin ? ?Essential hypertension ?-Stable, continue metoprolol ? ?Coronary artery disease involving native coronary artery of native heart without angina pectoris ?-Stable, continue metoprolol, statin ? ?Dyslipidemia ?Continue statin ? ? ?DVT prophylaxis: Heparin subcutaneous ?Code Status: Full code ?Family Communication: Discussed with patient in detail, no family at bedside ?Disposition Plan: Home in 1 to 2 days ? ?Consultants: Cardiology ? ? ?Procedures: Left heart cath ?CONCLUSIONS: Irregularities from proximal to distal LAD with 40 to 50% proximal and [place] 0, 1, 1 bifurcation in the mid vessel with 0%, 50%, and 70% stenoses. Left main is widely patent Circumflex is codominant and widely patent Right coronary is codominant with luminal irregularities Mild pulmonary  hypertension ? ?Antimicrobials:  ? ? ?Objective: ?Vitals:  ? 12/06/21 2222 12/06/21 2246 12/07/21 0537 12/07/21 1129  ?BP: 97/75 126/63 129/78 128/71  ?Pulse: 79 76 73 79  ?Resp:   16 16  ?Temp:   98.5 ?F (36.9 ?C) 97.9 ?F (36.6 ?C)  ?TempSrc:   Oral Oral  ?SpO2: 91% 91% 95%   ?Weight:   77.7 kg   ?Height:      ? ? ?Intake/Output Summary (Last 24 hours) at 12/07/2021 1416 ?Last data filed at 12/06/2021 2113 ?Gross per 24 hour  ?Intake 88.62 ml  ?Output 325 ml  ?Net -236.38 ml  ? ?Filed Weights  ? 12/05/21 0452 12/06/21 0539 12/07/21 0537  ?Weight: 79 kg 77.8 kg 77.7 kg  ? ? ?Examination: ? ?General exam: Appears calm and comfortable  ?Respiratory system: Clear to auscultation ?Cardiovascular system: S1 & S2 heard, RRR.  Systolic murmur ?Abd: nondistended, soft and nontender.Normal bowel sounds heard. ?Central nervous system: Alert and oriented. No focal neurological deficits. ?Extremities: no edema ?Skin: No rashes ?Psychiatry:  Mood & affect appropriate.  ? ? ? ?Data Reviewed:  ? ?CBC: ?Recent Labs  ?Lab 12/04/21 ?1629 12/05/21 ?0153 12/06/21 ?0153 12/06/21 ?1742 12/06/21 ?1751 12/06/21 ?1917  ?WBC 10.8* 9.7 8.5  --   --  7.4  ?NEUTROABS  --   --  4.5  --   --   --   ?HGB 12.6 11.9* 12.3 12.6 12.2 14.3  ?HCT 37.2 36.1 36.1 37.0 36.0 43.1  ?MCV 95.9 96.5 95.3  --   --  95.1  ?PLT 246 245 250  --   --  247  ? ?Basic Metabolic Panel: ?Recent Labs  ?Lab 12/04/21 ?1629 12/05/21 ?0153 12/06/21 ?0153 12/06/21 ?1742 12/06/21 ?1751 12/06/21 ?1917 12/07/21 ?0345  ?NA 137 138 136 138 138  --  138  ?K 3.4* 3.9 3.6 3.7  3.7  --  3.7  ?CL 108 106 103  --   --   --  106  ?CO2 21* 23 24  --   --   --  23  ?GLUCOSE 104* 125* 101*  --   --   --  88  ?BUN '12 13 21  '$ --   --   --  20  ?CREATININE 0.72 0.74 0.70  --   --  0.78 0.74  ?CALCIUM 9.2 8.9 9.1  --   --   --  9.0  ?MG 2.0  --   --   --   --   --   --   ? ?GFR: ?Estimated Creatinine Clearance: 56.6 mL/min (by C-G formula based on SCr of 0.74 mg/dL). ?Liver Function Tests: ?Recent  Labs  ?Lab 12/06/21 ?0153  ?AST 22  ?ALT 21  ?ALKPHOS 62  ?BILITOT 1.0  ?PROT 6.5  ?ALBUMIN 3.2*  ? ?No results for input(s): LIPASE, AMYLASE in the last 168 hours. ?No results for input(s): AMMONIA in the last 168 hours. ?Coagulation Profile: ?No results for input(s): INR, PROTIME in the last 168 hours. ?Cardiac Enzymes: ?No results for input(s): CKTOTAL, CKMB, CKMBINDEX, TROPONINI in the last 168 hours. ?BNP (last 3 results) ?No results for input(s): PROBNP in the last 8760 hours. ?HbA1C: ?No results for input(s): HGBA1C in the last 72 hours. ?CBG: ?No results for input(s): GLUCAP in the last 168 hours. ?Lipid Profile: ?Recent Labs  ?  12/06/21 ?0153  ?CHOL 111  ?HDL 38*  ?[place] 52  ?TRIG 107  ?CHOLHDL 2.9  ? ?Thyroid Function Tests: ?Recent Labs  ?  12/06/21 ?0153  ?TSH 3.141  ? ?Anemia Panel: ?No results for input(s): VITAMINB12, FOLATE, FERRITIN, TIBC, IRON, RETICCTPCT in the last 72 hours. ?Urine analysis: ?   ?Component Value Date/Time  ? COLORURINE YELLOW 01/05/2021 1256  ? APPEARANCEUR CLEAR 01/05/2021 1256  ? LABSPEC 1.012 01/05/2021 1256  ? PHURINE 6.0 01/05/2021 1256  ? GLUCOSEU NEGATIVE 01/05/2021 1256  ? HGBUR NEGATIVE 01/05/2021 1256  ? [place] NEGATIVE 01/05/2021 1256  ? [place] NEGATIVE 01/05/2021 1256  ? PROTEINUR NEGATIVE 01/05/2021 1256  ? UROBILINOGEN 0.2 12/29/2012 1259  ? NITRITE NEGATIVE 01/05/2021 1256  ? LEUKOCYTESUR TRACE (A) 01/05/2021 1256  ? ?Sepsis Labs: ?'@LABRCNTIP'$ (procalcitonin:4,lacticidven:4) ? ?)No results found for this or any previous visit (from the past 240 hour(s)).  ? ?Radiology Studies: ?CARDIAC CATHETERIZATION ? ?Result Date: 12/06/2021 ?CONCLUSIONS: Irregularities from proximal to distal LAD with 40 to 50% proximal and [place] 0, 1, 1 bifurcation in the mid vessel with 0%, 50%, and 70% stenoses. Left main is widely patent Circumflex is codominant and widely patent Right coronary is codominant with luminal irregularities Mild pulmonary hypertension, WHO group 2 with  mean PA pressure 32 mmHg. Peak to peak aortic valve gradient 21 mmHg and mean gradient 25 mmHg.  Calculated aortic valve area 1.1 cm?, consistent with moderate mitral stenosis. Transmitral mean gradient 11 mmHg with calculated mitral valve area 1.64 cm? Cardiac output by Fick 5.19 L/min and cardiac index 2.83 L/min/m?Marland Kitchen Mean pulmonary wedge pressure 18 mmHg, LVEDP 15 mmHg, and pulmonary vascular resistance 2.7 Wood units. RECOMMENDATIONS: Compensated heart failure with patient able to lie flat.  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 Structural heart team evaluating the patient for possible valve procedures. ? ?CT CORONARY MORPH W/CTA COR W/SCORE W/CA W/CM &/OR WO/CM ? ?Result Date: 12/07/2021 ?EXAM: OVER-READ INTERPRETATION  CT CHEST The following report is an over-read performed  by radiologist Dr. Yetta Glassman of [facility], [place] on 12/07/2021. This over-read does not include interpretation of cardiac or coronary anatomy or pathology. The coronary calcium score/coronary CTA interpretation by the cardiologist is attached. COMPARISON:  None Available. FINDINGS: Extracardiac findings will be described separately under dictation for contemporaneously obtained CTA chest, abdomen and pelvis. IMPRESSION: Please see separate dictation for contemporaneously obtained CTA chest, abdomen and pelvis dated 12/07/2021 for full description of relevant extracardiac findings. Electronically Signed   By: Yetta Glassman M.D.   On: 12/07/2021 12:49   ? ? ?Scheduled Meds: ? heparin  5,000 Units Subcutaneous Q8H  ? metoprolol tartrate      ? pramipexole  0.25 mg Oral 2 times per day  ? rosuvastatin  20 mg Oral Daily  ? sodium chloride flush  3 mL Intravenous Q12H  ? sodium chloride flush  3 mL Intravenous Q12H  ? ?Continuous Infusions: ? sodium chloride    ? ? ? LOS: 3 days  ? ? ?Time spent: 70mn ? ?PDomenic Polite MD ?[facility] ? ? ?12/07/2021, 2:16 PM  ?  ?

## 2021-12-07 NOTE — Progress Notes (Addendum)
? ?Progress Note ? ?Patient Name: Tiffany [place] ?Date of Encounter: 12/07/2021 ? ?[facility] Cardiologist: Minus Breeding, MD  ? ?Subjective  ? ?Patient is doing well this AM. Denies any chest pain, palpitations, sob, ankle swelling. Cath yesterday shows moderate CAD . Severe AS  ?Planning for CT scan today as her TAVR work up continues  ?Up ambulating, no CP ?Minimal dyspnea  ? ? ?Inpatient Medications  ?  ?Scheduled Meds: ? atorvastatin  80 mg Oral Daily  ? heparin  5,000 Units Subcutaneous Q8H  ? metoprolol tartrate  50 mg Oral Once  ? pramipexole  0.25 mg Oral 2 times per day  ? sodium chloride flush  3 mL Intravenous Q12H  ? sodium chloride flush  3 mL Intravenous Q12H  ? ?Continuous Infusions: ? sodium chloride    ? ?PRN Meds: ?sodium chloride, acetaminophen, ondansetron (ZOFRAN) IV, oxyCODONE, sodium chloride flush  ? ?Vital Signs  ?  ?Vitals:  ? 12/06/21 2217 12/06/21 2222 12/06/21 2246 12/07/21 0537  ?BP: (!) 130/93 97/75 126/63 129/78  ?Pulse: 81 79 76 73  ?Resp:    16  ?Temp:    98.5 ?F (36.9 ?C)  ?TempSrc:    Oral  ?SpO2: 92% 91% 91% 95%  ?Weight:    77.7 kg  ?Height:      ? ? ?Intake/Output Summary (Last 24 hours) at 12/07/2021 0942 ?Last data filed at 12/06/2021 2113 ?Gross per 24 hour  ?Intake 88.62 ml  ?Output 525 ml  ?Net -436.38 ml  ? ? ? ?  12/07/2021  ?  5:37 AM 12/06/2021  ?  5:39 AM 12/05/2021  ?  4:52 AM  ?Last 3 Weights  ?Weight (lbs) 171 lb 4.8 oz 171 lb 9.6 oz 174 lb 1.6 oz  ?Weight (kg) 77.701 kg 77.837 kg 78.971 kg  ?   ? ?Telemetry  ?  ?NSR  - Personally Reviewed ? ?ECG  ?  ?No new tracings since 5/7 - Personally Reviewed ? ?Physical Exam  ?Physical Exam: ?Blood pressure 129/78, pulse 73, temperature 98.5 ?F (36.9 ?C), temperature source Oral, resp. rate 16, height '5\' 4"'$  (1.626 m), weight 77.7 kg, SpO2 95 %. ? ?GEN:  Well nourished, well developed in no acute distress ?HEENT: Normal ?NECK: No JVD; No carotid bruits ?LYMPHATICS: No lymphadenopathy ?CARDIAC: RRR  3/6 systolic murmur   ?RESPIRATORY:  Clear to auscultation without rales, wheezing or rhonchi  ?ABDOMEN: Soft, non-tender, non-distended ?MUSCULOSKELETAL:  No edema; No deformity  ?SKIN: Warm and dry ?NEUROLOGIC:  Alert and oriented x 3 ? ? ?Labs  ?  ?High Sensitivity Troponin:   ?Recent Labs  ?Lab 12/04/21 ?1629 12/04/21 ?1820  ?TROPONINIHS 14 17  ? ?   ?Chemistry ?Recent Labs  ?Lab 12/04/21 ?1629 12/05/21 ?0153 12/06/21 ?0153 12/06/21 ?1742 12/06/21 ?1751 12/06/21 ?1917 12/07/21 ?0345  ?NA 137 138 136 138 138  --  138  ?K 3.4* 3.9 3.6 3.7 3.7  --  3.7  ?CL 108 106 103  --   --   --  106  ?CO2 21* 23 24  --   --   --  23  ?GLUCOSE 104* 125* 101*  --   --   --  88  ?BUN '12 13 21  '$ --   --   --  20  ?CREATININE 0.72 0.74 0.70  --   --  0.78 0.74  ?CALCIUM 9.2 8.9 9.1  --   --   --  9.0  ?MG 2.0  --   --   --   --   --   --   ?  PROT  --   --  6.5  --   --   --   --   ?ALBUMIN  --   --  3.2*  --   --   --   --   ?AST  --   --  22  --   --   --   --   ?ALT  --   --  21  --   --   --   --   ?ALKPHOS  --   --  75  --   --   --   --   ?BILITOT  --   --  1.0  --   --   --   --   ?GFRNONAA >60 >60 >60  --   --  >60 >60  ?ANIONGAP '8 9 9  '$ --   --   --  9  ? ?  ?Lipids  ?Recent Labs  ?Lab 12/06/21 ?0153  ?CHOL 111  ?TRIG 107  ?HDL 38*  ?[place] 52  ?CHOLHDL 2.9  ? ?  ?Hematology ?Recent Labs  ?Lab 12/05/21 ?0153 12/06/21 ?0153 12/06/21 ?1742 12/06/21 ?1751 12/06/21 ?1917  ?WBC 9.7 8.5  --   --  7.4  ?RBC 3.74* 3.79*  --   --  4.53  ?HGB 11.9* 12.3 12.6 12.2 14.3  ?HCT 36.1 36.1 37.0 36.0 43.1  ?MCV 96.5 95.3  --   --  95.1  ?MCH 31.8 32.5  --   --  31.6  ?MCHC 33.0 34.1  --   --  33.2  ?RDW 13.2 13.2  --   --  12.9  ?PLT 245 250  --   --  247  ? ? ?Thyroid  ?Recent Labs  ?Lab 12/06/21 ?0153  ?TSH 3.141  ? ?  ?BNP ?Recent Labs  ?Lab 12/04/21 ?1629  ?BNP 306.9*  ? ?  ?DDimer No results for input(s): DDIMER in the last 168 hours.  ? ?Radiology  ?  ?CARDIAC CATHETERIZATION ? ?Result Date: 12/06/2021 ?CONCLUSIONS: Irregularities from proximal to distal LAD  with 40 to 50% proximal and [place] 0, 1, 1 bifurcation in the mid vessel with 0%, 50%, and 70% stenoses. Left main is widely patent Circumflex is codominant and widely patent Right coronary is codominant with luminal irregularities Mild pulmonary hypertension, WHO group 2 with mean PA pressure 32 mmHg. Peak to peak aortic valve gradient 21 mmHg and mean gradient 25 mmHg.  Calculated aortic valve area 1.1 cm?, consistent with moderate mitral stenosis. Transmitral mean gradient 11 mmHg with calculated mitral valve area 1.64 cm? Cardiac output by Fick 5.19 L/min and cardiac index 2.83 L/min/m?Marland Kitchen Mean pulmonary wedge pressure 18 mmHg, LVEDP 15 mmHg, and pulmonary vascular resistance 2.7 Wood units. RECOMMENDATIONS: Compensated heart failure with patient able to lie flat.  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 Structural heart team evaluating the patient for possible valve procedures. ? ?ECHOCARDIOGRAM COMPLETE ? ?Result Date: 12/05/2021 ?   ECHOCARDIOGRAM REPORT   Patient Name:   Tiffany [place] Date of Exam: 12/05/2021 Medical Rec #:  [id number]        Height:       64.0 in Accession #:    [id number]       Weight:       174.1 lb Date of Birth:  16-Apr-1941         BSA:          1.844 m? Patient Age:    81  years         BP:           108/67 mmHg Patient Gender: F                HR:           83 bpm. Exam Location:  Inpatient Procedure: 2D Echo, Cardiac Doppler and Color Doppler Indications:    CHF- Acute systolic  History:        Patient has prior history of Echocardiogram examinations, most                 recent 05/03/2021. CHF, CAD; Risk Factors:Hypertension.  Sonographer:    Jefferey Pica Referring Phys: [id number] [place]  1. Left ventricular ejection fraction, by estimation, is >75%. The left ventricle has hyperdynamic function. The left ventricle has no regional wall motion abnormalities. Left ventricular diastolic parameters are consistent with Grade II diastolic  dysfunction (pseudonormalization).  2. Right ventricular systolic function is normal. The right ventricular size is normal. There is severely elevated pulmonary artery systolic pressure. The estimated right ventricular systolic pressure is 45.4 mmHg.  3. Left atrial size was moderately dilated.  4. Splay artifact. The mitral valve is degenerative. Moderate to severe mitral valve regurgitation. Moderate mitral stenosis. The mean mitral valve gradient is 8.0 mmHg. Severe mitral annular calcification.  5. Tricuspid valve regurgitation is moderate.  6. The aortic valve is calcified. There is moderate calcification of the aortic valve. There is moderate thickening of the aortic valve. Aortic valve regurgitation is mild. Moderate to severe aortic valve stenosis. Aortic regurgitation PHT measures 426 msec. Aortic valve area, by VTI measures 0.82 cm?Marland Kitchen Aortic valve mean gradient measures 33.2 mmHg. Aortic valve Vmax measures 3.96 m/s.  7. The inferior vena cava is normal in size with greater than 50% respiratory variability, suggesting right atrial pressure of 3 mmHg. Comparison(s): Aortic stenosis and mitral stenosis have increased. FINDINGS  Left Ventricle: Left ventricular ejection fraction, by estimation, is >75%. The left ventricle has hyperdynamic function. The left ventricle has no regional wall motion abnormalities. The left ventricular internal cavity size was normal in size. There is no left ventricular hypertrophy. Left ventricular diastolic parameters are consistent with Grade II diastolic dysfunction (pseudonormalization). Right Ventricle: The right ventricular size is normal. No increase in right ventricular wall thickness. Right ventricular systolic function is normal. There is severely elevated pulmonary artery systolic pressure. The tricuspid regurgitant velocity is 4.02 m/s, and with an assumed right atrial pressure of 10 mmHg, the estimated right ventricular systolic pressure is 09.8 mmHg. Left Atrium:  Left atrial size was moderately dilated. Right Atrium: Right atrial size was normal in size. Pericardium: There is no evidence of pericardial effusion. Mitral Valve: Splay artifact. The mitral valve is degenera

## 2021-12-08 ENCOUNTER — Encounter (HOSPITAL_COMMUNITY): Payer: Self-pay | Admitting: Family Medicine

## 2021-12-08 DIAGNOSIS — J9601 Acute respiratory failure with hypoxia: Secondary | ICD-10-CM | POA: Diagnosis not present

## 2021-12-08 DIAGNOSIS — I350 Nonrheumatic aortic (valve) stenosis: Secondary | ICD-10-CM | POA: Diagnosis not present

## 2021-12-08 LAB — BASIC METABOLIC PANEL
Anion gap: 8 (ref 5–15)
BUN: 21 mg/dL (ref 8–23)
CO2: 20 mmol/L — ABNORMAL LOW (ref 22–32)
Calcium: 9 mg/dL (ref 8.9–10.3)
Chloride: 109 mmol/L (ref 98–111)
Creatinine, Ser: 0.71 mg/dL (ref 0.44–1.00)
GFR, Estimated: >60 mL/min (ref >60)
Glucose, Bld: 90 mg/dL (ref 70–99)
Potassium: 4.1 mmol/L (ref 3.5–5.1)
Sodium: 137 mmol/L (ref 135–145)

## 2021-12-08 LAB — LIPOPROTEIN A (LPA): Lipoprotein (a): 214.5 nmol/L — ABNORMAL HIGH (ref <75.0)

## 2021-12-08 MED ORDER — FUROSEMIDE 40 MG PO TABS: 40.0000 mg | ORAL_TABLET | Freq: Every day | ORAL | 0 refills | Status: DC

## 2021-12-08 MED ORDER — FUROSEMIDE 10 MG/ML IJ SOLN
40.0000 mg | Freq: Once | INTRAMUSCULAR | Status: AC
  Administered 2021-12-08: 40 mg via INTRAVENOUS
  Filled 2021-12-08: qty 4

## 2021-12-08 MED ORDER — POTASSIUM CHLORIDE CRYS ER 20 MEQ PO TBCR: 20.0000 meq | EXTENDED_RELEASE_TABLET | Freq: Every day | ORAL | 0 refills | Status: DC

## 2021-12-08 NOTE — Progress Notes (Signed)
? ?Progress Note ? ?Patient Name: Tiffany Washington ?Date of Encounter: 12/08/2021 ? ?[facility] Cardiologist: Minus Breeding, MD  ? ?Subjective  ? ?Patient is doing well this AM. Denies any chest pain, palpitations, sob, ankle swelling. Cath yesterday shows moderate CAD . Severe AS  ?Planning for CT scan today as her TAVR work up continues  ?Up ambulating, no CP ?Minimal dyspnea  ? ?Coronary CTA : ?Coronary calcium score 1657 (95th percentile) ?Note per Dr. Gardiner Rhyme:  ? ? Small aortic annulus measuring 69m x 169min diameter with ?perimeter 6421mnd area 305 mm^2. No annular or LVOT ?calcification. By annular area, would be appropriate for placement ?of 47m51molut valve, though by perimeter would size to a 26mm48mlve. Sinus of valsalva diameter would be borderline for 26mm 76me ?however (measures 27mm a12mght cusp), but would be appropriate for ?47mm va17m Would discuss at structural heart conference ? ? ?She is feeling well.  Up walking in the hallway ?Not on any cardiac medications ? ?Is on atorva for HLD  ? ? ? ?Inpatient Medications  ?  ?Scheduled Meds: ? heparin  5,000 Units Subcutaneous Q8H  ? pramipexole  0.25 mg Oral 2 times per day  ? rosuvastatin  20 mg Oral Daily  ? sodium chloride flush  3 mL Intravenous Q12H  ? sodium chloride flush  3 mL Intravenous Q12H  ? ?Continuous Infusions: ? sodium chloride    ? ?PRN Meds: ?sodium chloride, acetaminophen, ondansetron (ZOFRAN) IV, oxyCODONE, sodium chloride flush  ? ?Vital Signs  ?  ?Vitals:  ? 12/07/21 0537 12/07/21 1129 12/07/21 2216 12/08/21 0516  ?BP: 129/78 128/71 (!) 102/52 (!) 114/58  ?Pulse: 73 79 66 66  ?Resp: '16 16 16 17  '$ ?Temp: 98.5 ?F (36.9 ?C) 97.9 ?F (36.6 ?C) 98.2 ?F (36.8 ?C) 98.2 ?F (36.8 ?C)  ?TempSrc: Oral Oral Oral Oral  ?SpO2: 95%  91% 93%  ?Weight: 77.7 kg   78.2 kg  ?Height:      ? ? ?Intake/Output Summary (Last 24 hours) at 12/08/2021 0910 ?Last data filed at 12/08/2021 0522 ?Gross per 24 hour  ?Intake --  ?Output 1450 ml  ?Net  -1450 ml  ? ? ? ?  12/08/2021  ?  5:16 AM 12/07/2021  ?  5:37 AM 12/06/2021  ?  5:39 AM  ?Last 3 Weights  ?Weight (lbs) 172 lb 6.4 oz 171 lb 4.8 oz 171 lb 9.6 oz  ?Weight (kg) 78.2 kg 77.701 kg 77.837 kg  ?   ? ?Telemetry  ?  ?NSR  - Personally Reviewed ? ?ECG  ?  ?No new tracings since 5/7 - Personally Reviewed ? ?Physical Exam  ?Physical Exam: ?Blood pressure (!) 114/58, pulse 66, temperature 98.2 ?F (36.8 ?C), temperature source Oral, resp. rate 17, height '5\' 4"'$  (1.626 m), weight 78.2 kg, SpO2 93 %. ? ?GEN:  Well nourished, well developed in no acute distress ?HEENT: Normal ?NECK: No JVD; No carotid bruits ?LYMPHATICS: No lymphadenopathy ?CARDIAC: RRR  3/6 systolic murmur  ?RESPIRATORY:  Clear to auscultation without rales, wheezing or rhonchi  ?ABDOMEN: Soft, non-tender, non-distended ?MUSCULOSKELETAL:  No edema; No deformity  ?SKIN: Warm and dry ?NEUROLOGIC:  Alert and oriented x 3 ? ? ?Labs  ?  ?High Sensitivity Troponin:   ?Recent Labs  ?Lab 12/04/21 ?1629 12/04/21 ?1820  ?TROPONINIHS 14 17  ? ?   ?Chemistry ?Recent Labs  ?Lab 12/04/21 ?1629 12/05/21 ?0153 12/06/21 ?0153 12/06/21 ?1742 12/06/21 ?1751 12/06/21 ?1917 12/07/21 ?0345 12/08/21 ?0218  ?[id number]   < >  136   < > 138  --  138 137  ?K 3.4*   < > 3.6   < > 3.7  --  3.7 4.1  ?CL 108   < > 103  --   --   --  106 109  ?CO2 21*   < > 24  --   --   --  23 20*  ?GLUCOSE 104*   < > 101*  --   --   --  88 90  ?BUN 12   < > 21  --   --   --  20 21  ?CREATININE 0.72   < > 0.70  --   --  0.78 0.74 0.71  ?CALCIUM 9.2   < > 9.1  --   --   --  9.0 9.0  ?MG 2.0  --   --   --   --   --   --   --   ?PROT  --   --  6.5  --   --   --   --   --   ?ALBUMIN  --   --  3.2*  --   --   --   --   --   ?AST  --   --  22  --   --   --   --   --   ?ALT  --   --  21  --   --   --   --   --   ?ALKPHOS  --   --  31  --   --   --   --   --   ?BILITOT  --   --  1.0  --   --   --   --   --   ?GFRNONAA >60   < > >60  --   --  >60 >60 >60  ?ANIONGAP 8   < > 9  --   --   --  9 8  ? < > = values  in this interval not displayed.  ? ?  ?Lipids  ?Recent Labs  ?Lab 12/06/21 ?0153  ?CHOL 111  ?TRIG 107  ?HDL 38*  ?[place] 52  ?CHOLHDL 2.9  ? ?  ?Hematology ?Recent Labs  ?Lab 12/05/21 ?0153 12/06/21 ?0153 12/06/21 ?1742 12/06/21 ?1751 12/06/21 ?1917  ?WBC 9.7 8.5  --   --  7.4  ?RBC 3.74* 3.79*  --   --  4.53  ?HGB 11.9* 12.3 12.6 12.2 14.3  ?HCT 36.1 36.1 37.0 36.0 43.1  ?MCV 96.5 95.3  --   --  95.1  ?MCH 31.8 32.5  --   --  31.6  ?MCHC 33.0 34.1  --   --  33.2  ?RDW 13.2 13.2  --   --  12.9  ?PLT 245 250  --   --  247  ? ? ?Thyroid  ?Recent Labs  ?Lab 12/06/21 ?0153  ?TSH 3.141  ? ?  ?BNP ?Recent Labs  ?Lab 12/04/21 ?1629  ?BNP 306.9*  ? ?  ?DDimer No results for input(s): DDIMER in the last 168 hours.  ? ?Radiology  ?  ?CARDIAC CATHETERIZATION ? ?Result Date: 12/06/2021 ?CONCLUSIONS: Irregularities from proximal to distal LAD with 40 to 50% proximal and [place] 0, 1, 1 bifurcation in the mid vessel with 0%, 50%, and 70% stenoses. Left main is widely patent Circumflex is codominant and widely patent Right coronary is codominant with luminal irregularities Mild pulmonary hypertension, WHO group  2 with mean PA pressure 32 mmHg. Peak to peak aortic valve gradient 21 mmHg and mean gradient 25 mmHg.  Calculated aortic valve area 1.1 cm?, consistent with moderate mitral stenosis. Transmitral mean gradient 11 mmHg with calculated mitral valve area 1.64 cm? Cardiac output by Fick 5.19 L/min and cardiac index 2.83 L/min/m?Marland Kitchen Mean pulmonary wedge pressure 18 mmHg, LVEDP 15 mmHg, and pulmonary vascular resistance 2.7 Wood units. RECOMMENDATIONS: Compensated heart failure with patient able to lie flat.  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 Structural heart team evaluating the patient for possible valve procedures. ? ?CT CORONARY MORPH W/CTA COR W/SCORE W/CA W/CM &/OR WO/CM ? ?Addendum Date: 12/07/2021   ?ADDENDUM REPORT: 12/07/2021 19:21 CLINICAL DATA:  81 -year-old female with severe  aortic stenosis being evaluated for a TAVR procedure. EXAM: Cardiac TAVR CT TECHNIQUE: The patient was scanned on a [employer]. A 120 kV retrospective scan was triggered in the descending thoracic aorta at 111 HU's. Gantry rotation speed was 250 msecs and collimation was .6 mm. No beta blockade or nitro were given. The 3D data set was reconstructed in 5% intervals of the R-R cycle. Systolic and diastolic phases were analyzed on a dedicated work station using MPR, MIP and VRT modes. The patient received 80 cc of contrast. FINDINGS: Aortic Root: Aortic valve: Trileaflet Aortic valve calcium score: 1165 Aortic annulus: Diameter: 7m x 176mPerimeter: 6445mrea: 305 mm^2 Calcifications: No calcifications Coronary height: Min Left - 45m39max Left - 18mm45mn Right - 12mm 32mtubular height: Left cusp - 21mm; 1mt cusp - 17mm; N12mronary cusp - 21mm LVO56ms measured 3 mm below the annulus): Diameter: 25mm x 1635mrea:57m mm^2 Calcifications: No calcifications Aortic sinus width: Left cusp - 30mm; Right33mp - 27mm; Noncor61my cusp - 31mm Sinotubu62mjunction width: 28mm x 25mm Op42mm Fl19mscopic Angle for Delivery: LAO 3 CAU 6 Cardiac: Right atrium: Mild enlargement Right ventricle: Mild enlargement Pulmonary arteries: Normal size Pulmonary veins: Normal configuration Left atrium: Mild enlargement Left ventricle: Normal size Pericardium: Normal thickness Mitral valve: Severe mitral annular calcification Coronary arteries: Calcium score 1657 (95th percentile) IMPRESSION: 1. Trileaflet aortic valve with moderate calcifications (AV calcium score 1165) 2. Small aortic annulus measuring 24mm x 17mm in d61mter 28m perimeter 64mm and area 305 67m. No annular or LVOT calcification. By annular area, would be appropriate for placement of 23mm Evolut valve, 44mgh by perimeter would size to a 26mm valve. Sinus of34msalva diameter would be borderline for 26mm valve however (m72mres 27mm at right cusp), b30mwould be appropriate for 23mm valve. Would discu69mt structural heart conference 3. Sufficient coronary to annulus distance, measuring 12mm to RCA and 45mm to 76m main 4.  Opt41m Fluoroscopic Angle for

## 2021-12-08 NOTE — Discharge Summary (Signed)
Physician Discharge Summary  ?Tiffany Washington CLE:[id number] DOB: June 15, 1941 DOA: 12/04/2021 ? ?PCP: Velna Hatchet, MD ? ?Admit date: 12/04/2021 ?Discharge date: 12/08/2021 ? ?Time spent: 35 minutes ? ?Recommendations for Outpatient Follow-up:  ?CHF TOC clinic in 1 week ?[name] structural heart team 6/11, TAVR workup ? ?Discharge Diagnoses:  ?Principal Problem: ?  Acute respiratory failure with hypoxia ([place]) ?Acute diastolic CHF ?Severe pulmonary hypertension ?Severe aortic stenosis ?Moderate to severe mitral regurgitation ?Moderate mitral stenosis ?  Dyslipidemia ?  Coronary artery disease involving native coronary artery of native heart without angina pectoris ?  Essential hypertension ?  Cerebrovascular accident (CVA) (Tiffany Washington) ?  CHF (congestive heart failure) (Tiffany Washington) ?  Hypokalemia ? ? ?Discharge Condition: Stable ? ?Diet recommendation: Low-sodium, heart healthy ? ?Filed Weights  ? 12/06/21 0539 12/07/21 0537 12/08/21 0516  ?Weight: 77.8 kg 77.7 kg 78.2 kg  ? ? ?History of present illness:  ?Tiffany Washington with history of hypertension, intracranial hemorrhage 6/22, mild carotid artery disease, coronary calcification, moderate aortic stenosisPresented to the ED with dyspnea on exertion, orthopnea, lower extremity edema, in the ED she was hypoxic, chest x-ray noted pulmonary edema, vascular congestion ? ?Hospital Course:  ? ?Acute diastolic CHF ?Severe pulmonary hypertension ?Acute respiratory failure with hypoxia (Tiffany Washington) ?-Echo in 5/23 noted EF> 94%, grade 2 diastolic dysfunction, severe PAH moderate to severe aortic stenosis moderate mitral stenosis, moderate to severe MR ?-Left heart cath 5/9 noted moderate nonobstructive CAD ?-diuresed with IV lasix, followed by cardiology, IV diuretics held prior to cath, oral Lasix resumed at discharge clinically appears euvolemic now, ?-FU with CHF toC clinic and structural heart team ?  ?Moderate to severe aortic stenosis ?Moderate to severe MR ?Moderate mitral stenosis ?-Cardiology  following, TAVR work-up ongoing ?-Cath 5/9 with nonobstructive CAD ?-TAVR CT imaging also noted thoracic compression fractures with the patient is asymptomatic from, likely chronic ?-Follow-up with structural heart team after discharge ?  ?Hypokalemia ?repleted  ?  ?Cerebrovascular accident (CVA) (Tiffany Washington) ?Continue statin ?  ?Essential hypertension ?-Stable, continue metoprolol ?  ?Coronary artery disease involving native coronary artery of native heart without angina pectoris ?-Stable, continue metoprolol, statin ?  ?Dyslipidemia ?Continue statin ?  ?  ?Consultants: Cardiology ?  ?  ?Procedures: Left heart cath ?CONCLUSIONS: Irregularities from proximal to distal LAD with 40 to 50% proximal and [place] 0, 1, 1 bifurcation in the mid vessel with 0%, 50%, and 70% stenoses. Left main is widely patent Circumflex is codominant and widely patent Right coronary is codominant with luminal irregularities Mild pulmonary hypertension ? ? ?Discharge Exam: ?Vitals:  ? 12/07/21 2216 12/08/21 0516  ?BP: (!) 102/52 (!) 114/58  ?Pulse: 66 66  ?Resp: 16 17  ?Temp: 98.2 ?F (36.8 ?C) 98.2 ?F (36.8 ?C)  ?SpO2: 91% 93%  ? ?General exam: Appears calm and comfortable  ?Respiratory system: Clear to auscultation ?Cardiovascular system: S1 & S2 heard, RRR.  Systolic murmur ?Abd: nondistended, soft and nontender.Normal bowel sounds heard. ?Central nervous system: Alert and oriented. No focal neurological deficits. ?Extremities: no edema ?Skin: No rashes ?Psychiatry:  Mood & affect appropriate.  ? ?Discharge Instructions ? ? ?Discharge Instructions   ? ? Diet - low sodium heart healthy   Complete by: As directed ?  ? Increase activity slowly   Complete by: As directed ?  ? ?  ? ?Allergies as of 12/08/2021   ? ?   Reactions  ? Carbamazepine Other (See Comments)  ? Doesn't remember reaction  ? Ciprofloxacin Other (See Comments)  ? Patient stated she "  cannot take this"  ? Diclofenac Other (See Comments)  ? Reaction not recalled  ? Indomethacin Other  (See Comments)  ? Reaction not recalled  ? Meloxicam Other (See Comments)  ? Reaction not recalled  ? Nortriptyline Other (See Comments)  ? Reaction not recalled  ? Voltaren [diclofenac Sodium] Other (See Comments)  ? Reaction not recalled  ? Gabapentin Other (See Comments)  ? Makes the patient unable to sleep  ? Pamelor [nortriptyline Hcl] Other (See Comments)  ? insomnia  ? Ropinirole Other (See Comments)  ? Speed up the restless legs  ? Sulfa Antibiotics Diarrhea, Nausea And Vomiting  ? ?  ? ?  ?Medication List  ?  ? ?STOP taking these medications   ? ?lisinopril 10 MG tablet ?Commonly known as: ZESTRIL ?  ? ?  ? ?TAKE these medications   ? ?acetaminophen 325 MG tablet ?Commonly known as: TYLENOL ?Take 650 mg by mouth every 6 (six) hours as needed for moderate pain or headache. ?  ?B COMPLEX 100 PO ?Take 1 tablet by mouth daily. ?  ?furosemide 40 MG tablet ?Commonly known as: Lasix ?Take 1 tablet (40 mg total) by mouth daily. ?  ?multivitamin with minerals tablet ?Take 1 tablet by mouth daily. ?  ?potassium chloride SA 20 MEQ tablet ?Commonly known as: KLOR-CON M ?Take 1 tablet (20 mEq total) by mouth daily. ?  ?pramipexole 0.25 MG tablet ?Commonly known as: MIRAPEX ?Take 0.25 mg by mouth 2 (two) times daily. ?  ?rosuvastatin 20 MG tablet ?Commonly known as: CRESTOR ?Take 1 tablet (20 mg total) by mouth daily. ?  ?vitamin C 1000 MG tablet ?Take 1,000 mg by mouth daily. ?  ?Zinc 30 MG Caps ?Take 30 mg by mouth daily. ?  ? ?  ? ?Allergies  ?Allergen Reactions  ? Carbamazepine Other (See Comments)  ?  Doesn't remember reaction  ? Ciprofloxacin Other (See Comments)  ?  Patient stated she "cannot take this"  ? Diclofenac Other (See Comments)  ?  Reaction not recalled  ? Indomethacin Other (See Comments)  ?  Reaction not recalled  ? Meloxicam Other (See Comments)  ?  Reaction not recalled  ? Nortriptyline Other (See Comments)  ?  Reaction not recalled  ? Voltaren [Diclofenac Sodium] Other (See Comments)  ?  Reaction  not recalled  ? Gabapentin Other (See Comments)  ?  Makes the patient unable to sleep  ? Pamelor [Nortriptyline Hcl] Other (See Comments)  ?  insomnia  ? Ropinirole Other (See Comments)  ?  Speed up the restless legs  ? Sulfa Antibiotics Diarrhea and Nausea And Vomiting  ? ? Follow-up Information   ? ? Gaye Pollack, MD Follow up on 03/08/2022.   ?Specialty: Cardiothoracic Surgery ?Why: at 2:30pm. Please arirve at 2:15pm. ?Contact information: ?[address] ?[address] ?[place] [address] ?[phone] ? ? ?  ?  ? ? [name] D, NP Follow up on 01/02/2022.   ?Specialty: Cardiology ?Why: at 1pm. Please arrive at 12:45pm. ?Contact information: ?[address] ?[address] ?[place] [address] ?[phone] ? ? ?  ?  ? ? Velna Hatchet, MD. Schedule an appointment as soon as possible for a visit in 1 week(s).   ?Specialty: Internal Medicine ?Contact information: ?[address] ?[place] [address] ?[phone] ? ? ?  ?  ? ?  ?  ? ?  ? ? ? ?The results of significant diagnostics from this hospitalization (including imaging, microbiology, ancillary and laboratory) are listed  below for reference.   ? ?Significant Diagnostic Studies: ?DG Chest 2 View ? ?Result Date: 12/04/2021 ?CLINICAL DATA:  Shortness of breath and extremity edema. EXAM: CHEST - 2 VIEW COMPARISON:  None Available. FINDINGS: Pulmonary vascular congestion with mixed interstitial airspace opacities bilaterally are noted compatible with pulmonary edema. Bilateral LOWER lung opacities/atelectasis noted. Small bilateral pleural effusions are present. The cardiomediastinal silhouette is grossly unremarkable. Probable compression fractures of a LOWER thoracic vertebra and L1 IMPRESSION: 1. Pulmonary vascular congestion with pulmonary edema and small effusions. 2. Bilateral LOWER lung opacities/atelectasis. Electronically Signed   By: Margarette Canada M.D.   On: 12/04/2021 17:52  ? ?CARDIAC CATHETERIZATION ? ?Result Date: 12/06/2021 ?CONCLUSIONS:  Irregularities from proximal to distal LAD with 40 to 50% proximal and [place] 0, 1, 1 bifurcation in the mid vessel with 0%, 50%, and 70% stenoses. Left main is widely patent Circumflex is codominant and widely patent

## 2021-12-08 NOTE — Plan of Care (Signed)
?  Problem: Education: ?Goal: Ability to demonstrate management of disease process will improve ?Outcome: Adequate for Discharge ?Goal: Ability to verbalize understanding of medication therapies will improve ?Outcome: Adequate for Discharge ?Goal: Individualized Educational Video(s) ?Outcome: Adequate for Discharge ?  ?Problem: Activity: ?Goal: Capacity to carry out activities will improve ?Outcome: Adequate for Discharge ?  ?Problem: Cardiac: ?Goal: Ability to achieve and maintain adequate cardiopulmonary perfusion will improve ?Outcome: Adequate for Discharge ?  ?Problem: Education: ?Goal: Knowledge of General Education information will improve ?Description: Including pain rating scale, medication(s)/side effects and non-pharmacologic comfort measures ?Outcome: Adequate for Discharge ?  ?Problem: Health Behavior/Discharge Planning: ?Goal: Ability to manage health-related needs will improve ?Outcome: Adequate for Discharge ?  ?

## 2021-12-08 NOTE — Care Management Important Message (Signed)
Important Message ? ?Patient Details  ?Name: Tiffany Washington ?MRN: [id number] ?Date of Birth: 02/11/1941 ? ? ?Medicare Important Message Given:  Yes ? ? ? ? ?Shelda Altes ?12/08/2021, 10:25 AM ?

## 2021-12-08 NOTE — Progress Notes (Signed)
Discharge instructions, RX's explained and provided to patient verbalized understanding. Patient left floor via wheelchair accompanied by staff  to D/C lounge. ? ?Dhanya Bogle, Tivis Ringer, RN ? ?

## 2021-12-08 NOTE — Progress Notes (Addendum)
Heart Failure Navigator Progress Note ? ?Assessed for Heart & Vascular TOC clinic [facility] @ 11 am. . Hulen Skains and spoke to patient on phone after discharge about her appointment.  ? ? ? ?Earnestine Leys, BSN, RN ?Heart Failure Nurse Navigator ?Secure Chat Only   ?

## 2021-12-09 SURGERY — ECHOCARDIOGRAM, TRANSESOPHAGEAL
Anesthesia: Monitor Anesthesia Care

## 2021-12-13 ENCOUNTER — Encounter (HOSPITAL_COMMUNITY): Payer: PPO

## 2021-12-14 DIAGNOSIS — I509 Heart failure, unspecified: Secondary | ICD-10-CM | POA: Diagnosis not present

## 2021-12-14 DIAGNOSIS — J9601 Acute respiratory failure with hypoxia: Secondary | ICD-10-CM | POA: Diagnosis not present

## 2021-12-14 DIAGNOSIS — I272 Pulmonary hypertension, unspecified: Secondary | ICD-10-CM | POA: Diagnosis not present

## 2021-12-14 DIAGNOSIS — I350 Nonrheumatic aortic (valve) stenosis: Secondary | ICD-10-CM | POA: Diagnosis not present

## 2021-12-14 DIAGNOSIS — I2510 Atherosclerotic heart disease of native coronary artery without angina pectoris: Secondary | ICD-10-CM | POA: Diagnosis not present

## 2021-12-18 NOTE — Progress Notes (Signed)
HEART & VASCULAR TRANSITION OF CARE CONSULT NOTE     Referring Physician: Dr. Broadus John Primary Care: Dr. Ardeth Perfect Primary Cardiologist: Dr. Percival Spanish  HPI: Referred to clinic by Dr. Broadus John with [facility] for heart failure consultation.  81 y.o. female with history of coronary atherosclerosis noted on prior CT, aortic stenosis, hx basal ganglia bleed 06/22 (thought to be hypertensive), HTN, HLD. Has followed with Dr. Percival Spanish in Cardiology clinic.  Admitted earlier this month with acute hypoxic respiratory failure secondary to acute diastolic CHF in setting of valvular heart disease. Echo during admit with EF > 75%, RV okay, RVSP 75 mmHg, splay artifact with degenerative MV, moderate to severe MR, moderate MS, moderate TR, severe aortic valve stenosis. R/LHC with nonobstructive CAD, RA mean 4, PA mean 32 mmHg, PCWP mean 18 mmHg, LVEDP 15 mmHg, PVR 2.7 WU, Fick CO/CI 5.19/2.83, peak to peak aortic valve mean gradient 25 mmHg, transmitral mean gradient 11 mmHg with MV area 1.64 cm2. She was seen by structural heart team and felt to be a good candidate for TAVR. Diuresed with IV lasix and discharged home on 40 mg furosemide daily.   She is here today for f/u. Reports feeling well. Dyspnea has significantly improved and she has more energy. Enjoys working in her garden. Her weight has been stable between 162-163 lb. Mobility limited by chronic back pain and knee pain. No orthopnea, PND or LE edema. Denies CP, palpitations or dizziness. Has been watching sodium and fluid intake.   Review of Systems: [y] = yes, _0  = no   General: Weight gain _1 ; Weight loss _2 ; Anorexia _3 ; Fatigue _4 ; Fever _5 ; Chills _6 ; Weakness _7   Cardiac: Chest pain/pressure _8 ; Resting SOB _9 ; Exertional SOB _10 ; Orthopnea _11 ; Pedal Edema _12 ; Palpitations _13 ; Syncope _14 ; Presyncope _15 ; Paroxysmal nocturnal dyspnea_16   Pulmonary: Cough _17 ; Wheezing_18 ; Hemoptysis_19 ; Sputum _20 ; Snoring _21   GI: Vomiting_22 ;  Dysphagia_23 ; Melena_24 ; Hematochezia _25 ; Heartburn_26 ; Abdominal pain _27 ; Constipation _28 ; Diarrhea _29 ; BRBPR _30   GU: Hematuria_31 ; Dysuria _32 ; Nocturia_33   Vascular: Pain in legs with walking _34 ; Pain in feet with lying flat _35 ; Non-healing sores _36 ; Stroke [Y]; TIA _37 ; Slurred speech _38 ;  Neuro: Headaches_39 ; Vertigo_40 ; Seizures_41 ; Paresthesias_42 ;Blurred vision _43 ; Diplopia _44 ; Vision changes _45   Ortho/Skin: Arthritis _46 ; Joint pain [Y]; Muscle pain _47 ; Joint swelling _48 ; Back Pain [Y ]; Rash _49   Psych: Depression_50 ; Anxiety_51   Heme: Bleeding problems _52 ; Clotting disorders _53 ; Anemia _54   Endocrine: Diabetes _55 ; Thyroid dysfunction_56    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     Ascorbic Acid (VITAMIN C) 1000 MG tablet Take 1,000 mg by mouth daily.     B Complex Vitamins (B COMPLEX 100 PO) Take 1 tablet by mouth daily.     furosemide (LASIX) 40 MG tablet Take 1 tablet (40 mg total) by mouth daily. 30 tablet 0   lisinopril (ZESTRIL) 10 MG tablet Take 1 tablet (10 mg total) by mouth daily. 30 tablet 0   Multiple Vitamins-Minerals (MULTIVITAMIN WITH MINERALS) tablet Take 1 tablet by mouth daily.      pramipexole (MIRAPEX) 0.25 MG tablet Take 0.25 mg by mouth 2 (two) times daily.      rosuvastatin (CRESTOR) 20 MG tablet Take 1 tablet (20 mg total) by mouth daily. 30 tablet 3   Zinc 30 MG CAPS Take 30  mg by mouth daily.     No current facility-administered medications for this encounter.    Allergies  Allergen Reactions   Carbamazepine Other (See Comments)    Doesn't remember reaction   Ciprofloxacin Other (See Comments)    Patient stated she "cannot take this"   Diclofenac Other (See Comments)    Reaction not recalled   Indomethacin Other (See Comments)    Reaction not recalled   Meloxicam Other (See Comments)    Reaction not recalled   Nortriptyline Other (See Comments)    Reaction not recalled   Voltaren [Diclofenac Sodium] Other (See Comments)    Reaction not recalled   Gabapentin Other (See Comments)    Makes the patient unable to sleep   Pamelor [Nortriptyline Hcl] Other (See Comments)    insomnia   Ropinirole Other (See Comments)    Speed up the restless legs   Sulfa Antibiotics Diarrhea and Nausea And Vomiting      Social History   Socioeconomic History   Marital status: Married    Spouse name: Karlton Lemon   Number of children: 2   Years of education: Not on file   Highest education level: High school graduate  Occupational History   Occupation: retired  Tobacco Use   Smoking status: Never   Smokeless tobacco: Never  Vaping Use   Vaping Use: Never used  Substance and Sexual Activity   Alcohol use: No   Drug use: No   Sexual activity: Not on file  Other Topics Concern   Not on file  Social History Narrative   Working on a farm   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   She is married for 52 years and they have two children.   Social Determinants of Health   Financial Resource Strain: Not on file  Food Insecurity: No Food Insecurity   Worried About Charity fundraiser in the Last Year: Never true   Ran Out of Food in the Last Year: Never true  Transportation Needs: No Transportation Needs   Lack of Transportation (Medical): No   Lack of Transportation (Non-Medical): No  Physical Activity: Not on file  Stress: Not on file  Social  Connections: Not on file  Intimate Partner Violence: Not on file      Family History  Problem Relation Age of Onset   Hypertension Mother    Congestive Heart Failure Mother        Died, 51   Heart failure Mother    Pulmonary embolism Father        Died, 81   Diabetes Sister    CAD Brother 56       CABG  Diabetes Brother    Gout Son     Vitals:   12/19/21 1106  BP: 138/80  Pulse: 76  SpO2: 98%  Weight: 76.4 kg (168 lb 8 oz)    PHYSICAL EXAM: General:  No distress. Ambulated into clinic. HEENT: normal Neck: supple. no JVD. Carotids 2+ bilat; no bruits.  Cor: PMI nondisplaced. Regular rate & rhythm. No rubs, gallops, 3/6 harsh systolic murmur with reduced A2. Lungs: clear Abdomen: soft, nontender, nondistended.  Extremities: no cyanosis, clubbing, rash, edema Neuro: alert & oriented x 3, cranial nerves grossly intact. moves all 4 extremities w/o difficulty. Affect pleasant.   ASSESSMENT & PLAN: Chronic diastolic CHF: -Likely d/t valvular heart disease. See below. -Echo during recent admit with EF > 75%, hyperdynamic LV function, RV okay, RVSP 75 mmhg, moderate to severe MR, moderate MS, severe MAC, moderate TR, severe AS with mean gradient 33 mmHg, AVA by VTI 0.82 cm2 -R/LHC 05/23: nonobstructive CAD, RA mean 4, PA mean 32 mmHg, PCWP mean 18 mmHg, LVEDP 15 mmHg, PVR 2.7 WU, Fick CO/CI 5.19/2.83, peak to peak aortic valve mean gradient 25 mmHg, transmitral mean gradient 11 mmHg with MV area 1.64 cm2 -NYHA status difficult. Mobility impaired by back and knee pain. Volume looks good today. Continue Torsemide 40 mg daily. -Had been on lisinopril prior to admit. Her BP has ben reasonably controlled. Will avoid adding back lisinopril for now as she is afterload dependent with severe AS. Continue home BP monitoring.  2. Severe aortic valve stenosis: -Echo during recent admit as above -Following with structural heart team. Planning for possible TAVR. -Structural follow-up  06/05  3. Mitral valve disease: -Moderate to severe MR, moderate MS and severe MAC on echo -Following with structural heart team  4. CAD: -LHC 05/23 with 50% mid LAD, 70% D2 -No CP -On high-intensity statin. Not currently on aspirin d/t history hemorrhagic CVA  5. HTN: -BP reasonable today. SBP averages around 130 at home -See discussion in #1  6. HLD: -Continue Rosuvastatin 20  7. Hx hemorrhagic CVA: -In setting of uncontrolled HTN -Now off aspirin per Neurology   NYHA difficult to assess d/t orthopedic pain GDMT  Diuretic-Furosemide 40 mg daily BB-No Ace/ARB/ARNI-No MRA-No [id number]-No    Referred to HFSW (PCP, Medications, Transportation, ETOH Abuse, Drug Abuse, Insurance, Museum/gallery curator ): No Refer to Pharmacy: No Refer to Home Health: No Refer to Advanced Heart Failure Clinic: No  Refer to General Cardiology: Yes  Follow up As needed, f/u Dr. Percival Spanish in about 4 weeks, structural heart team as planned

## 2021-12-19 ENCOUNTER — Encounter (HOSPITAL_COMMUNITY): Payer: Self-pay

## 2021-12-19 ENCOUNTER — Ambulatory Visit (HOSPITAL_COMMUNITY)
Admission: RE | Admit: 2021-12-19 | Discharge: 2021-12-19 | Disposition: A | Discharge status: Home / Self Care | Payer: PPO | Source: Ambulatory Visit | Attending: Physician Assistant | Admitting: Physician Assistant

## 2021-12-19 ENCOUNTER — Telehealth (HOSPITAL_COMMUNITY): Payer: Self-pay | Admitting: *Deleted

## 2021-12-19 VITALS — BP 138/80 | HR 76 | Wt 168.5 lb

## 2021-12-19 DIAGNOSIS — I2510 Atherosclerotic heart disease of native coronary artery without angina pectoris: Secondary | ICD-10-CM | POA: Insufficient documentation

## 2021-12-19 DIAGNOSIS — I110 Hypertensive heart disease with heart failure: Secondary | ICD-10-CM | POA: Insufficient documentation

## 2021-12-19 DIAGNOSIS — I059 Rheumatic mitral valve disease, unspecified: Secondary | ICD-10-CM

## 2021-12-19 DIAGNOSIS — I5032 Chronic diastolic (congestive) heart failure: Secondary | ICD-10-CM

## 2021-12-19 DIAGNOSIS — M25569 Pain in unspecified knee: Secondary | ICD-10-CM | POA: Diagnosis not present

## 2021-12-19 DIAGNOSIS — Z8673 Personal history of transient ischemic attack (TIA), and cerebral infarction without residual deficits: Secondary | ICD-10-CM | POA: Insufficient documentation

## 2021-12-19 DIAGNOSIS — I10 Essential (primary) hypertension: Secondary | ICD-10-CM | POA: Diagnosis not present

## 2021-12-19 DIAGNOSIS — E782 Mixed hyperlipidemia: Secondary | ICD-10-CM

## 2021-12-19 DIAGNOSIS — I350 Nonrheumatic aortic (valve) stenosis: Secondary | ICD-10-CM | POA: Diagnosis not present

## 2021-12-19 DIAGNOSIS — I080 Rheumatic disorders of both mitral and aortic valves: Secondary | ICD-10-CM | POA: Insufficient documentation

## 2021-12-19 DIAGNOSIS — Z79899 Other long term (current) drug therapy: Secondary | ICD-10-CM | POA: Insufficient documentation

## 2021-12-19 DIAGNOSIS — E785 Hyperlipidemia, unspecified: Secondary | ICD-10-CM | POA: Insufficient documentation

## 2021-12-19 LAB — BASIC METABOLIC PANEL
Anion gap: 6 (ref 5–15)
BUN: 22 mg/dL (ref 8–23)
CO2: 27 mmol/L (ref 22–32)
Calcium: 9.7 mg/dL (ref 8.9–10.3)
Chloride: 105 mmol/L (ref 98–111)
Creatinine, Ser: 0.87 mg/dL (ref 0.44–1.00)
GFR, Estimated: >60 mL/min (ref >60)
Glucose, Bld: 99 mg/dL (ref 70–99)
Potassium: 4.2 mmol/L (ref 3.5–5.1)
Sodium: 138 mmol/L (ref 135–145)

## 2021-12-19 LAB — BRAIN NATRIURETIC PEPTIDE: B Natriuretic Peptide: 157.9 pg/mL — ABNORMAL HIGH (ref 0.0–100.0)

## 2021-12-19 MED ORDER — LISINOPRIL 10 MG PO TABS: 10.0000 mg | ORAL_TABLET | Freq: Every day | ORAL | 0 refills | Status: DC

## 2021-12-19 MED ORDER — POTASSIUM CHLORIDE CRYS ER 20 MEQ PO TBCR: 20.0000 meq | EXTENDED_RELEASE_TABLET | Freq: Every day | ORAL | 0 refills | Status: DC

## 2021-12-19 NOTE — Patient Instructions (Addendum)
RESTART Lisinopril 10 mg Daily  STOP Potassium (klor-con)  Labs done today, we will call you for abnormal results  Thank you for allowing [place] to provider your heart failure care after your recent hospitalization. Please follow-up with Dr Percival Spanish at [facility] in about 4 weeks, his office will call you to schedule this appointment  Do the following things EVERYDAY: Weigh yourself in the morning before breakfast. Write it down and keep it in a log. Take your medicines as prescribed Eat low salt foods--Limit salt (sodium) to 2000 mg per day.  Stay as active as you can everyday Limit all fluids for the day to less than 2 liters

## 2021-12-19 NOTE — Progress Notes (Addendum)
Patient notified via phone after today's visit not to restart home lisinopril. She verbalized understanding of instructions.

## 2021-12-19 NOTE — Telephone Encounter (Signed)
Called to confirm Heart & Vascular Transitions of Care appointment at 11 am on 12/19/21. Patient reminded to bring all medications and pill box organizer with them. Confirmed patient has transportation. Gave directions, instructed to utilize [place] parking.  Confirmed appointment prior to ending call.    Earnestine Leys, BSN, Clinical cytogeneticist Only

## 2021-12-30 NOTE — Progress Notes (Addendum)
HEART AND [place]                                     Cardiology Office Note:    Date:  01/03/2022   ID:  Tiffany Washington, DOB 29-Apr-1941, MRN [id number]  PCP:  Velna Hatchet, MD  [facility] Cardiologist:  Minus Breeding, MD  [facility] Electrophysiologist:  None   Referring MD: Velna Hatchet, MD   Follow up aortic stenosis undergoing TAVR work up  History of Present Illness:    Tiffany Washington is a 81 y.o. female with a hx of intracranial hemorrhage 12/2020 felt hypertensive in etiology due to territory, non obstructive CAD, HTN, arthritis with limited mobility and severe AS as well as mitral valve disease who presents to clinic for follow up.   The patient is followed by Dr. Percival Spanish and has a history of aortic stenosis. She was admitted in 11/2021 for acute hypoxic respiratory failure secondary to acute diastolic heart failure and severe aortic stenosis. Echocardiogram showed a hyperdynamic LV at >75% with G2DD, splay artifact with degenerative MV, moderate to severe MR, moderate MS, moderate TR, mild AI, and moderate to severe aortic stenosis which appeared to have progressed since prior echocardiogram.  Structural heart was consulted while she was admitted and she was seen by Dr. Angelena Form. L/RHC on 12/06/2021 with nonobstructive CAD, RA mean 4, PA mean 32 mmHg, PCWP mean 18 mmHg, LVEDP 15 mmHg, PVR 2.7 WU, Fick CO/CI 5.19/2.83, peak to peak aortic valve mean gradient 25 mmHg, transmitral mean gradient 11 mmHg with MV area 1.64 cm2. Pre-TAVR CTs were completed showing a small aortic annulus that sized to a 20 mm Edwards SAPIEN versus a 23 versus 26 Evolut Fx with adequate transfemoral access.  Valve team discussion for valve sizing was recommended. She was diuresed with IV lasix and discharged home on 40 mg furosemide daily.   She was seen in the heart failure outpatient clinic on 12/19/2021.  Weight stable between 162-163 lb. She was  continued on her current medications.   Today the patient presents to clinic for follow up. Here with her husband. No CP or SOB. No LE edema or orthopnea. No syncope. No blood in stool or urine. No palpitations. She does wake up in the middle of the night having to sit up straight and feels dizzy. ? PND. No exertional dizziness.  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)    Mild carotid artery disease (HCC)    Mitral regurgitation    Restless leg syndrome    Spondylolisthesis of lumbar region     Past Surgical History:  Procedure Laterality Date   BACK SURGERY  10/2019   CESAREAN SECTION     COLONOSCOPY     COLONOSCOPY W/ BIOPSIES AND POLYPECTOMY     KNEE SURGERY Bilateral    Bilateral   MULTIPLE TOOTH EXTRACTIONS     POLYPECTOMY     RIGHT/LEFT HEART CATH AND CORONARY ANGIOGRAPHY N/A 12/06/2021   Procedure: RIGHT/LEFT HEART CATH AND CORONARY ANGIOGRAPHY;  Surgeon: Belva Crome, MD;  Location: [facility];  Service: Cardiovascular;  Laterality: N/A;   UPPER GASTROINTESTINAL ENDOSCOPY     VAGINAL HYSTERECTOMY      Current Medications: Current Meds  Medication Sig   acetaminophen (TYLENOL) 325 MG tablet Take 650 mg by mouth every 6 (six) hours as needed for moderate pain or headache.   Ascorbic Acid (VITAMIN C) 1000 MG tablet Take 1,000 mg by mouth daily.   B Complex Vitamins (B COMPLEX 100 PO) Take 1 tablet by mouth daily.   Multiple Vitamins-Minerals (MULTIVITAMIN WITH MINERALS) tablet Take 1 tablet by mouth daily.    pramipexole (MIRAPEX) 0.25 MG tablet Take 0.25 mg by mouth 2 (two) times daily.    rosuvastatin (CRESTOR) 20 MG tablet Take 1 tablet (20 mg total) by mouth daily.   Zinc 30 MG CAPS Take 30 mg by mouth  daily.   [DISCONTINUED] furosemide (LASIX) 40 MG tablet Take 1 tablet (40 mg total) by mouth daily.   [DISCONTINUED] potassium chloride SA (KLOR-CON M) 20 MEQ tablet Take 1 tablet (20 mEq total) by mouth daily.     Allergies:   Carbamazepine, Ciprofloxacin, Diclofenac, Indomethacin, Meloxicam, Nortriptyline, Voltaren [diclofenac sodium], Gabapentin, Pamelor [nortriptyline hcl], Ropinirole, and Sulfa antibiotics   Social History   Socioeconomic History   Marital status: Married    Spouse name: Karlton Lemon   Number of children: 2   Years of education: Not on file   Highest education level: High school graduate  Occupational History   Occupation: retired  Tobacco Use   Smoking status: Never   Smokeless tobacco: Never  Vaping Use   Vaping Use: Never used  Substance and Sexual Activity   Alcohol use: No   Drug use: No   Sexual activity: Not on file  Other Topics Concern   Not on file  Social History Narrative   Working on a farm   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   She is married for 52 years and they have two children.   Social Determinants of Health   Financial Resource Strain: Not on file  Food Insecurity: No Food Insecurity   Worried About Charity fundraiser in the Last Year: Never true   Ran Out of Food in the Last Year: Never true  Transportation Needs: No Transportation Needs   Lack of Transportation (Medical): No   Lack of Transportation (Non-Medical): No  Physical Activity: Not on file  Stress: Not on file  Social Connections: Not on file     Family History: The patient's family history includes CAD (age of onset: 98) in her brother; Congestive Heart Failure in her mother; Diabetes in her brother and sister; Gout in her son; Heart failure in her mother; Hypertension in her mother; Pulmonary embolism in her father.  ROS:   Please see the history of present illness.    All other systems reviewed and are negative.  EKGs/Labs/Other Studies Reviewed:     The following studies were reviewed today:  Echo 12/05/21 IMPRESSIONS  1. Left ventricular ejection fraction, by estimation, is >75%. The left  ventricle has hyperdynamic function. The left ventricle has no regional  wall motion abnormalities. Left ventricular diastolic parameters are  consistent with Grade II diastolic  dysfunction (pseudonormalization).   2. Right ventricular systolic function is normal. The right ventricular  size is normal. There is severely elevated pulmonary artery systolic  pressure. The estimated right ventricular systolic pressure is 97.3 mmHg.   3. Left atrial size was moderately dilated.  4. Splay artifact. The mitral valve is degenerative. Moderate to severe  mitral valve regurgitation. Moderate mitral stenosis. The mean mitral  valve gradient is 8.0 mmHg. Severe mitral annular calcification.   5. Tricuspid valve regurgitation is moderate.   6. The aortic valve is calcified. There is moderate calcification of the  aortic valve. There is moderate thickening of the aortic valve. Aortic  valve regurgitation is mild. Moderate to severe aortic valve stenosis.  Aortic regurgitation PHT measures 426  msec. Aortic valve area, by VTI measures 0.82 cm. Aortic valve mean  gradient measures 33.2 mmHg. Aortic valve Vmax measures 3.96 m/s.   7. The inferior vena cava is normal in size with greater than 50%  respiratory variability, suggesting right atrial pressure of 3 mmHg.   Comparison(s): Aortic stenosis and mitral stenosis have increased.   ________________________  [facility] 12/06/21 CONCLUSIONS: Irregularities from proximal to distal LAD with 40 to 50% proximal and [place] 0, 1, 1 bifurcation in the mid vessel with 0%, 50%, and 70% stenoses. Left main is widely patent Circumflex is codominant and widely patent Right coronary is codominant with luminal irregularities Mild pulmonary hypertension, WHO group 2 with mean PA pressure 32 mmHg. Peak to peak aortic valve  gradient 21 mmHg and mean gradient 25 mmHg.  Calculated aortic valve area 1.1 cm, consistent with moderate mitral stenosis. Transmitral mean gradient 11 mmHg with calculated mitral valve area 1.64 cm Cardiac output by Fick 5.19 L/min and cardiac index 2.83 L/min/m. Mean pulmonary wedge pressure 18 mmHg, LVEDP 15 mmHg, and pulmonary vascular resistance 2.7 Wood units.   RECOMMENDATIONS: Compensated heart failure with patient able to lie flat.  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 Structural heart team evaluating the patient for possible valve procedures.  _______________________  CT cardiac 12/07/21 ADDENDUM REPORT: 12/07/2021 19:21   CLINICAL DATA:  34 -year-old female with severe aortic stenosis being evaluated for a TAVR procedure.   EXAM: Cardiac TAVR CT   TECHNIQUE: The patient was scanned on a [employer]. A 120 kV retrospective scan was triggered in the descending thoracic aorta at 111 HU's. Gantry rotation speed was 250 msecs and collimation was .6 mm. No beta blockade or nitro were given. The 3D data set was reconstructed in 5% intervals of the R-R cycle. Systolic and diastolic phases were analyzed on a dedicated work station using MPR, MIP and VRT modes. The patient received 80 cc of contrast.   FINDINGS: Aortic Root:   Aortic valve: Trileaflet   Aortic valve calcium score: 1165   Aortic annulus:   Diameter: 48m x 14m  Perimeter: 6422m Area: 305 mm^2   Calcifications: No calcifications   Coronary height: Min Left - 45m25max Left - 18mm35mn Right - 12mm 6mnotubular height: Left cusp - 21mm; 65mt cusp - 17mm; N9mronary cusp - 21mm   L68m(as measured 3 mm below the annulus):   Diameter: 25mm x 1639m Are41m03 mm^2   Calcifications: No calcifications   Aortic sinus width: Left cusp - 30mm; Right79mp - 27mm; Noncor8my cusp - 31mm   Sinotu85mr junction width: 28mm x 25mm   61mimum79moroscopic Angle for Delivery: LAO 3 CAU 6   Cardiac:   Right atrium: Mild enlargement   Right ventricle: Mild enlargement   Pulmonary arteries: Normal size   Pulmonary veins: Normal configuration   Left atrium: Mild enlargement   Left ventricle: Normal size   Pericardium:  Normal thickness   Mitral valve: Severe mitral annular calcification   Coronary arteries: Calcium score 1657 (95th percentile)   IMPRESSION: 1. Trileaflet aortic valve with moderate calcifications (AV calcium score 1165)   2. Small aortic annulus measuring 27m x 135min diameter with perimeter 6435mnd area 305 mm^2. No annular or LVOT calcification. By annular area, would be appropriate for placement of 59m6molut valve, though by perimeter would size to a 26mm80mve. Sinus of valsalva diameter would be borderline for 26mm 50me however (measures 27mm a96mght cusp), but would be appropriate for 59mm va53m Would discuss at structural heart conference   3. Sufficient coronary to annulus distance, measuring 12mm to 16mand 15mm to l37mmain   4.  Optimum Fluoroscopic Angle for Delivery:  LAO 3 CAU 6   5.  Coronary calcium score 1657 (95th percentile)  ___________________  CT chest/abd/pelvis 12/07/21 EXAM: CT ANGIOGRAPHY CHEST, ABDOMEN AND PELVIS   TECHNIQUE: Non-contrast CT of the chest was initially obtained.   Multidetector CT imaging through the chest, abdomen and pelvis was performed using the standard protocol during bolus administration of intravenous contrast. Multiplanar reconstructed images and MIPs were obtained and reviewed to evaluate the vascular anatomy.   RADIATION DOSE REDUCTION: This exam was performed according to the departmental dose-optimization program which includes automated exposure control, adjustment of the mA and/or kV according to patient size and/or use of iterative reconstruction technique.   CONTRAST:  100mL OMNIP34m IOHEXOL 350 MG/ML SOLN    COMPARISON:  CT abdomen and pelvis dated February 08, 2018   FINDINGS: CTA CHEST FINDINGS   Cardiovascular: Normal heart size. Pericardial effusion. Left main and three-vessel coronary artery calcifications. Aortic valve thickening and calcifications. Moderate atherosclerotic disease of the thoracic aorta. Standard three-vessel aortic arch with no significant stenosis. No suspicious filling defects of the central pulmonary arteries. Mitral annular calcifications.   Mediastinum/Nodes: Thyroid is unremarkable. Small hiatal hernia. Mildly enlarged mediastinal and hilar lymph nodes, likely reactive. Reference right lower paratracheal lymph node measuring 1.2 cm in short axis on series 4, image 52.   Lungs/Pleura: Central airways are patent. Bilateral central and upper lobe predominant ground-glass opacities with associated interlobular septal thickening. Small right-greater-than-left pleural effusions and bibasilar atelectasis.   Musculoskeletal: New mild age indeterminate compression deformity at T10.   CTA ABDOMEN AND PELVIS FINDINGS   Hepatobiliary: No focal liver abnormality is seen. No gallstones, gallbladder wall thickening, or biliary dilatation.   Pancreas: Unremarkable. No pancreatic ductal dilatation or surrounding inflammatory changes.   Spleen: Normal in size without focal abnormality.   Adrenals/Urinary Tract: Bilateral adrenal glands are unremarkable. No hydronephrosis or nephrolithiasis. Simple appearing cyst of the upper pole of the left kidney, unchanged compared to prior exam.   Stomach/Bowel: Stomach is within normal limits. Appendix appears normal. No evidence of bowel wall thickening, distention, or inflammatory changes.   Vascular/lymphatic: Severe atherosclerotic disease of the abdominal aorta. Normal caliber abdominal aorta. Severe narrowing at the origin of the celiac artery with poststenotic dilatation, due to a combination of calcified and  noncalcified plaque. Moderate narrowing at the origin of the SMA due to calcified plaque. Mild narrowing at the origin of the right renal artery due to calcified plaque, left renal arteries widely patent. IMA is patent. No pathologically enlarged lymph nodes seen in the abdomen or pelvis.   Reproductive: No adnexal masses.   Other: No abdominal wall hernia or abnormality. No abdominopelvic ascites.   Musculoskeletal: Interval posterior fusion of L4-L5 similar mild L2 compression  deformity new mild age indeterminate T12 and L3 compression deformities.   VASCULAR MEASUREMENTS PERTINENT TO TAVR:   AORTA:   Minimal Aortic Diameter -  13.3 mm   Severity of Aortic Calcification-severe   RIGHT PELVIS:   Right Common Iliac Artery -   Minimal Diameter-9.3 mm   Tortuosity-none   Calcification-severe   Right External Iliac Artery -   Minimal Diameter-7.2 mm   Tortuosity-mild   Calcification-none   Right Common Femoral Artery -   Minimal Diameter-5.8 mm   Tortuosity-none   Calcification-moderate   LEFT PELVIS:   Left Common Iliac Artery -   Minimal Diameter-9.7 mm   Tortuosity-none   Calcification-mild   Left External Iliac Artery -   Minimal Diameter-7.6 mm   Tortuosity-moderate   Calcification-none   Left Common Femoral Artery -   Minimal Diameter-6.4 mm   Tortuosity-none   Calcification-mild   Review of the MIP images confirms the above findings.   IMPRESSION: 1. Vascular findings and measurements pertinent to potential TAVR procedure, as detailed above. 2. Severe thickening and calcification of the aortic valve, compatible with reported clinical history of severe aortic stenosis. 3. 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 Left main and 3 vessel coronary artery disease. 4. Moderate pulmonary edema and small right-greater-than-left. 5. New mild indeterminate compression deformities at T10, T12, and L3. Correlate for point  tenderness.    EKG:  EKG is NOT ordered today.   Recent Labs: 12/04/2021: Magnesium 2.0 12/06/2021: ALT 21; Hemoglobin 14.3; Platelets 247; TSH 3.141 12/19/2021: B Natriuretic Peptide 157.9; BUN 22; Creatinine, Ser 0.87; Potassium 4.2; Sodium 138  Recent Lipid Panel    Component Value Date/Time   CHOL 111 12/06/2021 0153   TRIG 107 12/06/2021 0153   HDL 38 (L) 12/06/2021 0153   CHOLHDL 2.9 12/06/2021 0153   VLDL 21 12/06/2021 0153   LDLCALC 52 12/06/2021 0153     Risk Assessment/Calculations:       Physical Exam:    VS:  BP 118/68   Pulse 80   Ht _0  (1.626 m)   Wt 172 lb 12.8 oz (78.4 kg)   SpO2 95%   BMI 29.66 kg/m     Wt Readings from Last 3 Encounters:  01/02/22 172 lb 12.8 oz (78.4 kg)  12/19/21 168 lb 8 oz (76.4 kg)  12/08/21 172 lb 6.4 oz (78.2 kg)     GEN:  Well nourished, well developed in no acute distress HEENT: Normal NECK: No JVD LYMPHATICS: No lymphadenopathy CARDIAC: RRR, 4/6 harsh systolic murmur- loudest at RUSB. No  rubs, gallops RESPIRATORY:  Clear to auscultation without rales, wheezing or rhonchi  ABDOMEN: Soft, non-tender, non-distended MUSCULOSKELETAL:  No edema; No deformity  SKIN: Warm and dry NEUROLOGIC:  Alert and oriented x 3 PSYCHIATRIC:  Normal affect   ASSESSMENT:    1. Severe aortic stenosis   2. Chronic diastolic heart failure ([place])   3. Mitral valve disease   4. Coronary artery disease involving native heart without angina pectoris, unspecified vessel or lesion type   5. Primary hypertension   6. Dyslipidemia   7. Nontraumatic subcortical hemorrhage of left cerebral hemisphere [facility])    PLAN:    In order of problems listed above:   Severe aortic valve stenosis: has completed TAVR work up but still has to meet Dr. Cyndia Bent. She has an apt 03/08/22.   Chronic diastolic CHF: -Echo during recent admit with EF > 75%, hyperdynamic LV function, RV okay, RVSP 75 mmhg, moderate to severe MR, moderate MS,  severe MAC, moderate TR,  severe AS with mean gradient 33 mmHg, AVA by VTI 0.82 cm2 -R/LHC 05/23: nonobstructive CAD, RA mean 4, PA mean 32 mmHg, PCWP mean 18 mmHg, LVEDP 15 mmHg, PVR 2.7 WU, Fick CO/CI 5.19/2.83, peak to peak aortic valve mean gradient 25 mmHg, transmitral mean gradient 11 mmHg with MV area 1.64 cm2 -NYHA status difficult. Mobility impaired by back and knee pain. Volume looks good today. Continue furosemide 40 mg daily/Kdur 17mq daily.   Mitral valve disease: -Moderate to severe MR, moderate MS and severe MAC on echo -Following with structural heart team   CAD: -LHC 05/23 with 50% mid LAD, 70% D2 -No CP -On high-intensity statin. Not currently on aspirin d/t history hemorrhagic CVA   HTN: -BP reasonable today. SBP averages around 130 at home -See discussion in #1   HLD: -Continue Rosuvastatin 20   Hx hemorrhagic CVA: -In setting of uncontrolled HTN -Now off aspirin per Neurology      Medication Adjustments/Labs and Tests Ordered: Current medicines are reviewed at length with the patient today.  Concerns regarding medicines are outlined above.  No orders of the defined types were placed in this encounter.  Meds ordered this encounter  Medications   furosemide (LASIX) 40 MG tablet    Sig: Take 1 tablet (40 mg total) by mouth daily.    Dispense:  90 tablet    Refill:  3   potassium chloride SA (KLOR-CON M) 20 MEQ tablet    Sig: Take 1 tablet (20 mEq total) by mouth daily.    Dispense:  90 tablet    Refill:  3    Patient Instructions  Medication Instructions:  Your physician recommends that you continue on your current medications as directed. Please refer to the Current Medication list given to you today.  *If you need a refill on your cardiac medications before your next appointment, please call your pharmacy*   Lab Work: NONE If you have labs (blood work) drawn today and your tests are completely normal, you will receive your results only by: [place](if you have  MyChart) OR A paper copy in the mail If you have any lab test that is abnormal or we need to change your treatment, we will call you to review the results.   Testing/Procedures: NONE   Follow-Up: At [facility] you and your health needs are our priority.  As part of our continuing mission to provide you with exceptional heart care, we have created designated Provider Care Teams.  These Care Teams include your primary Cardiologist (physician) and Advanced Practice Providers (APPs -  Physician Assistants and Nurse Practitioners) who all work together to provide you with the care you need, when you need it.  We recommend signing up for the patient portal called "MyChart".  Sign up information is provided on this After Visit Summary.  MyChart is used to connect with patients for Virtual Visits (Telemedicine).  Patients are able to view lab/test results, encounter notes, upcoming appointments, etc.  Non-urgent messages can be sent to your provider as well.   To learn more about what you can do with MyChart, go to [email or web address]    Your next appointment:   KEEP SCHEDULED APPOINTMENT   Important Information About Sugar         SWeston BrassKAngelena Form PA-C  01/03/2022 2:46 PM    [place]

## 2022-01-02 ENCOUNTER — Ambulatory Visit: Payer: PPO | Admitting: Physician Assistant

## 2022-01-02 VITALS — BP 118/68 | HR 80 | Ht 64.0 in | Wt 172.8 lb

## 2022-01-02 DIAGNOSIS — I059 Rheumatic mitral valve disease, unspecified: Secondary | ICD-10-CM | POA: Diagnosis not present

## 2022-01-02 DIAGNOSIS — I350 Nonrheumatic aortic (valve) stenosis: Secondary | ICD-10-CM

## 2022-01-02 DIAGNOSIS — E785 Hyperlipidemia, unspecified: Secondary | ICD-10-CM | POA: Diagnosis not present

## 2022-01-02 DIAGNOSIS — I5032 Chronic diastolic (congestive) heart failure: Secondary | ICD-10-CM | POA: Diagnosis not present

## 2022-01-02 DIAGNOSIS — I610 Nontraumatic intracerebral hemorrhage in hemisphere, subcortical: Secondary | ICD-10-CM

## 2022-01-02 DIAGNOSIS — I10 Essential (primary) hypertension: Secondary | ICD-10-CM

## 2022-01-02 DIAGNOSIS — I2510 Atherosclerotic heart disease of native coronary artery without angina pectoris: Secondary | ICD-10-CM | POA: Diagnosis not present

## 2022-01-02 MED ORDER — POTASSIUM CHLORIDE CRYS ER 20 MEQ PO TBCR: 20.0000 meq | EXTENDED_RELEASE_TABLET | Freq: Every day | ORAL | 3 refills | Status: DC

## 2022-01-02 MED ORDER — FUROSEMIDE 40 MG PO TABS
40.0000 mg | ORAL_TABLET | Freq: Every day | ORAL | 3 refills | Status: DC
Start: 2022-01-02 — End: 2022-03-29

## 2022-01-02 NOTE — Patient Instructions (Signed)
Medication Instructions:  Your physician recommends that you continue on your current medications as directed. Please refer to the Current Medication list given to you today.  *If you need a refill on your cardiac medications before your next appointment, please call your pharmacy*   Lab Work: NONE If you have labs (blood work) drawn today and your tests are completely normal, you will receive your results only by: [place] (if you have MyChart) OR A paper copy in the mail If you have any lab test that is abnormal or we need to change your treatment, we will call you to review the results.   Testing/Procedures: NONE   Follow-Up: At [facility], you and your health needs are our priority.  As part of our continuing mission to provide you with exceptional heart care, we have created designated Provider Care Teams.  These Care Teams include your primary Cardiologist (physician) and Advanced Practice Providers (APPs -  Physician Assistants and Nurse Practitioners) who all work together to provide you with the care you need, when you need it.  We recommend signing up for the patient portal called "MyChart".  Sign up information is provided on this After Visit Summary.  MyChart is used to connect with patients for Virtual Visits (Telemedicine).  Patients are able to view lab/test results, encounter notes, upcoming appointments, etc.  Non-urgent messages can be sent to your provider as well.   To learn more about what you can do with MyChart, go to [email or web address].    Your next appointment:   KEEP SCHEDULED APPOINTMENT   Important Information About Sugar

## 2022-01-02 NOTE — Progress Notes (Addendum)
Pre Surgical Assessment: 5 M Walk Test     68M=16.68f     5 Meter Walk Test- trial 1: 16 seconds  5 Meter Walk Test- trial 2: 24 seconds  5 Meter Walk Test- trial 3: 17 seconds  5 Meter Walk Test Average: 19 seconds

## 2022-03-08 ENCOUNTER — Encounter: Payer: Self-pay | Admitting: Surgery

## 2022-03-08 ENCOUNTER — Institutional Professional Consult (permissible substitution): Payer: PPO | Admitting: Surgery

## 2022-03-08 VITALS — BP 137/75 | HR 90 | Resp 20 | Ht 64.0 in | Wt 168.0 lb

## 2022-03-08 DIAGNOSIS — I350 Nonrheumatic aortic (valve) stenosis: Secondary | ICD-10-CM

## 2022-03-08 NOTE — Progress Notes (Signed)
Patient ID: Tiffany Washington, female   DOB: 09-23-1940, 81 y.o.   MRN: [id number]  [facility]VE CLINIC         [address]       [place] [address]             [phone]          CARDIOTHORACIC SURGERY CONSULTATION REPORT  PCP is Velna Hatchet, MD Referring Provider is Lauree Chandler, MD Primary Cardiologist is Minus Breeding, MD  Reason for consultation:  Severe aortic stenosis  HPI:  The patient is an 81 year old woman with a history of hypertension, intracranial hemorrhage in 12/2020 felt to be due to hypertension, arthritis, and aortic stenosis who was admitted to [facility] in May 2023 with acute congestive heart failure.  2D echocardiogram on 12/05/2021 showed a calcified and thickened aortic valve with restricted leaflet mobility.  The mean gradient was 33.2 mmHg and the peak gradient was 63 mmHg.  Valve area by VTI was 0.82 cm.  There was mild aortic insufficiency.  There was degenerative disease of the mitral valve with moderate thickening and calcification as well as severe mitral annular calcification.  There was moderate to severe mitral valve regurgitation with an eccentric medially directed jet.  There was moderate mitral stenosis with a mean mitral gradient of 8 mmHg and a peak gradient of 30 mmHg.  PA systolic pressure was severely elevated with normal right heart function.  Left ventricular ejection fraction was greater than 75% with grade 2 diastolic dysfunction.  She underwent cardiac catheterization at that time with diffuse disease in the LAD and no significant disease in the left circumflex or RCA.  The aortic valve had a peak to peak gradient of 21 mmHg and mean gradient of 25 mmHg.  The transmitral mean gradient was 11 mmHg.  She was treated with diuresis with improvement.  She is here today with her husband.  She has not been doing very much activity but denies any chest pain or shortness of breath.  She  has had no orthopnea or peripheral edema.  She denies dizziness and syncope.  She has woken up a few times in the middle night and had to sit straight up to breathe and felt dizzy at that time.   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)    Mild carotid artery disease (HCC)    Mitral regurgitation    Restless leg syndrome    Spondylolisthesis of lumbar region     Past Surgical History:  Procedure Laterality Date   BACK SURGERY  10/2019   CESAREAN SECTION     COLONOSCOPY     COLONOSCOPY W/ BIOPSIES AND POLYPECTOMY     KNEE SURGERY Bilateral    Bilateral   MULTIPLE TOOTH EXTRACTIONS     POLYPECTOMY     RIGHT/LEFT HEART CATH AND CORONARY ANGIOGRAPHY N/A 12/06/2021   Procedure: RIGHT/LEFT HEART CATH AND CORONARY ANGIOGRAPHY;  Surgeon: Belva Crome, MD;  Location: [facility];  Service: Cardiovascular;  Laterality: N/A;   UPPER GASTROINTESTINAL ENDOSCOPY     VAGINAL HYSTERECTOMY      Family History  Problem  Relation Age of Onset   Hypertension Mother    Congestive Heart Failure Mother        Died, 89   Heart failure Mother    Pulmonary embolism Father        Died, 81   Diabetes Sister    CAD Brother 2       CABG   Diabetes Brother    Gout Son     Social History   Socioeconomic History   Marital status: Married    Spouse name: Karlton Lemon   Number of children: 2   Years of education: Not on file   Highest education level: High school graduate  Occupational History   Occupation: retired  Tobacco Use   Smoking status: Never   Smokeless tobacco: Never  Vaping Use   Vaping Use: Never used  Substance and Sexual Activity   Alcohol use: No   Drug use: No   Sexual activity: Not on file  Other Topics Concern    Not on file  Social History Narrative   Working on a farm   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   She is married for 52 years and they have two children.   Social Determinants of Health   Financial Resource Strain: Not on file  Food Insecurity: No Food Insecurity (12/08/2021)   Hunger Vital Sign    Worried About Running Out of Food in the Last Year: Never true    Ran Out of Food in the Last Year: Never true  Transportation Needs: No Transportation Needs (12/08/2021)   PRAPARE - Hydrologist (Medical): No    Lack of Transportation (Non-Medical): No  Physical Activity: Not on file  Stress: Not on file  Social Connections: Not on file  Intimate Partner Violence: Not on file    Prior to Admission medications   Medication Sig Start Date End Date Taking? Authorizing Provider  acetaminophen (TYLENOL) 325 MG tablet Take 650 mg by mouth every 6 (six) hours as needed for moderate pain or headache.   Yes [provider]  Ascorbic Acid (VITAMIN C) 1000 MG tablet Take 1,000 mg by mouth daily.   Yes [provider]  B Complex Vitamins (B COMPLEX 100 PO) Take 1 tablet by mouth daily.   Yes [provider]  furosemide (LASIX) 40 MG tablet Take 1 tablet (40 mg total) by mouth daily. 01/02/22  Yes Eileen Stanford, PA-C  Multiple Vitamins-Minerals (MULTIVITAMIN WITH MINERALS) tablet Take 1 tablet by mouth daily.    Yes [provider]  potassium chloride SA (KLOR-CON M) 20 MEQ tablet Take 1 tablet (20 mEq total) by mouth daily. 01/02/22  Yes Eileen Stanford, PA-C  pramipexole (MIRAPEX) 0.25 MG tablet Take 0.25 mg by mouth 2 (two) times daily.    Yes [provider]  rosuvastatin (CRESTOR) 20 MG tablet Take 1 tablet (20 mg total) by mouth daily. 01/07/21  Yes Einar Pheasant, NP  Zinc 30 MG CAPS Take 30 mg by mouth daily.   Yes [provider]    Current Outpatient Medications  Medication Sig Dispense  Refill   acetaminophen (TYLENOL) 325 MG tablet Take 650 mg by mouth every 6 (six) hours as needed for moderate pain or headache.     Ascorbic Acid (VITAMIN C) 1000 MG tablet Take 1,000 mg by mouth daily.     B Complex Vitamins (B COMPLEX 100 PO) Take 1 tablet by mouth daily.  furosemide (LASIX) 40 MG tablet Take 1 tablet (40 mg total) by mouth daily. 90 tablet 3   Multiple Vitamins-Minerals (MULTIVITAMIN WITH MINERALS) tablet Take 1 tablet by mouth daily.      potassium chloride SA (KLOR-CON M) 20 MEQ tablet Take 1 tablet (20 mEq total) by mouth daily. 90 tablet 3   pramipexole (MIRAPEX) 0.25 MG tablet Take 0.25 mg by mouth 2 (two) times daily.      rosuvastatin (CRESTOR) 20 MG tablet Take 1 tablet (20 mg total) by mouth daily. 30 tablet 3   Zinc 30 MG CAPS Take 30 mg by mouth daily.     No current facility-administered medications for this visit.    Allergies  Allergen Reactions   Carbamazepine Other (See Comments)    Doesn't remember reaction   Ciprofloxacin Other (See Comments)    Patient stated she "cannot take this"   Diclofenac Other (See Comments)    Reaction not recalled   Indomethacin Other (See Comments)    Reaction not recalled   Meloxicam Other (See Comments)    Reaction not recalled   Nortriptyline Other (See Comments)    Reaction not recalled   Voltaren [Diclofenac Sodium] Other (See Comments)    Reaction not recalled   Gabapentin Other (See Comments)    Makes the patient unable to sleep   Pamelor [Nortriptyline Hcl] Other (See Comments)    insomnia   Ropinirole Other (See Comments)    Speed up the restless legs   Sulfa Antibiotics Diarrhea and Nausea And Vomiting      Review of Systems:   General:  normal appetite, + decreased energy, no weight gain, no weight loss, no fever  Cardiac:  no chest pain with exertion, no chest pain at rest, +SOB with moderate exertion, no resting SOB, + PND, no orthopnea, no palpitations, no arrhythmia, no atrial  fibrillation, no LE edema, + dizzy spells, no syncope  Respiratory:  + exertional shortness of breath, no home oxygen, no productive cough, no dry cough, no bronchitis, no wheezing, no hemoptysis, no asthma, no pain with inspiration or cough, no sleep apnea, no CPAP at night  GI:   no difficulty swallowing, no reflux, no frequent heartburn, no hiatal hernia, no abdominal pain, no constipation, no diarrhea, no hematochezia, no hematemesis, no melena  GU:   no dysuria,  no frequency, no urinary tract infection, no hematuria, no kidney stones, no kidney disease  Vascular:  no pain suggestive of claudication, no pain in feet, no leg cramps, no varicose veins, no DVT, no non-healing foot ulcer  Neuro:   no stroke, no TIA's, no seizures, no headaches, no temporary blindness one eye,  no slurred speech, no peripheral neuropathy, no chronic pain, + instability of gait, no memory/cognitive dysfunction  Musculoskeletal: + arthritis - primarily involving the knees, no joint swelling, no myalgias, + difficulty walking, + decreased mobility   Skin:   no rash, no itching, no skin infections, no pressure sores or ulcerations  Psych:   no anxiety, no depression, no nervousness, no unusual recent stress  Eyes:   no blurry vision, no floaters, no recent vision changes, + wears glasses   ENT:   no hearing loss, no loose or painful teeth, no dentures, sees dentist regularly  Hematologic:  no easy bruising, no abnormal bleeding, no clotting disorder, no frequent epistaxis  Endocrine:  no diabetes, does not check CBG's at home     Physical Exam:   BP 137/75   Pulse 90  Resp 20   Ht '5\' 4"'$  (1.626 m)   Wt 168 lb (76.2 kg)   SpO2 97% Comment: RA  BMI 28.84 kg/m   General:  well-appearing  HEENT:  Unremarkable, NCAT, PERLA, EOMI  Neck:   no JVD, no bruits, no adenopathy   Chest:   clear to auscultation, symmetrical breath sounds, no wheezes, no rhonchi   CV:   RRR, 3/6 systolic murmur RSB, no diastolic  murmur  Abdomen:  soft, non-tender, no masses   Extremities:  warm, well-perfused, pulses palpable bilaterally, no lower extremity edema  Rectal/GU  Deferred  Neuro:   Grossly non-focal and symmetrical throughout  Skin:   Clean and dry, no rashes, no breakdown  Diagnostic Tests:     ECHOCARDIOGRAM REPORT         Patient Name:   AVAYAH RAFFETY Date of Exam: 12/05/2021  Medical Rec #:  [id number]        Height:       64.0 in  Accession #:    [id number]       Weight:       174.1 lb  Date of Birth:  1941-07-28         BSA:          1.844 m  Patient Age:    4 years         BP:           108/67 mmHg  Patient Gender: F                HR:           83 bpm.  Exam Location:  Inpatient   Procedure: 2D Echo, Cardiac Doppler and Color Doppler   Indications:    CHF- Acute systolic     History:        Patient has prior history of Echocardiogram examinations,  most                  recent 05/03/2021. CHF, CAD; Risk Factors:Hypertension.     Sonographer:    Jefferey Pica  Referring Phys: [id number] [place]     1. Left ventricular ejection fraction, by estimation, is >75%. The left  ventricle has hyperdynamic function. The left ventricle has no regional  wall motion abnormalities. Left ventricular diastolic parameters are  consistent with Grade II diastolic  dysfunction (pseudonormalization).   2. Right ventricular systolic function is normal. The right ventricular  size is normal. There is severely elevated pulmonary artery systolic  pressure. The estimated right ventricular systolic pressure is 71.2 mmHg.   3. Left atrial size was moderately dilated.   4. Splay artifact. The mitral valve is degenerative. Moderate to severe  mitral valve regurgitation. Moderate mitral stenosis. The mean mitral  valve gradient is 8.0 mmHg. Severe mitral annular calcification.   5. Tricuspid valve regurgitation is moderate.   6. The aortic valve is calcified. There is moderate  calcification of the  aortic valve. There is moderate thickening of the aortic valve. Aortic  valve regurgitation is mild. Moderate to severe aortic valve stenosis.  Aortic regurgitation PHT measures 426  msec. Aortic valve area, by VTI measures 0.82 cm. Aortic valve mean  gradient measures 33.2 mmHg. Aortic valve Vmax measures 3.96 m/s.   7. The inferior vena cava is normal in size with greater than 50%  respiratory variability, suggesting right atrial pressure of 3 mmHg.   Comparison(s): Aortic stenosis and mitral stenosis have increased.  FINDINGS   Left Ventricle: Left ventricular ejection fraction, by estimation, is  >75%. The left ventricle has hyperdynamic function. The left ventricle has  no regional wall motion abnormalities. The left ventricular internal  cavity size was normal in size. There  is no left ventricular hypertrophy. Left ventricular diastolic parameters  are consistent with Grade II diastolic dysfunction (pseudonormalization).   Right Ventricle: The right ventricular size is normal. No increase in  right ventricular wall thickness. Right ventricular systolic function is  normal. There is severely elevated pulmonary artery systolic pressure. The  tricuspid regurgitant velocity is  4.02 m/s, and with an assumed right atrial pressure of 10 mmHg, the  estimated right ventricular systolic pressure is 57.0 mmHg.   Left Atrium: Left atrial size was moderately dilated.   Right Atrium: Right atrial size was normal in size.   Pericardium: There is no evidence of pericardial effusion.   Mitral Valve: Splay artifact. The mitral valve is degenerative in  appearance. There is moderate thickening of the mitral valve leaflet(s).  There is moderate calcification of the mitral valve leaflet(s). Severe  mitral annular calcification. Moderate to  severe mitral valve regurgitation, with eccentric medially directed jet.  Moderate mitral valve stenosis. MV peak gradient, 30.2  mmHg. The mean  mitral valve gradient is 8.0 mmHg.   Tricuspid Valve: The tricuspid valve is normal in structure. Tricuspid  valve regurgitation is moderate . No evidence of tricuspid stenosis.   Aortic Valve: The aortic valve is calcified. There is moderate  calcification of the aortic valve. There is moderate thickening of the  aortic valve. Aortic valve regurgitation is mild. Aortic regurgitation PHT  measures 426 msec. Moderate to severe aortic  stenosis is present. Aortic valve mean gradient measures 33.2 mmHg. Aortic  valve peak gradient measures 62.7 mmHg. Aortic valve area, by VTI measures  0.82 cm.   Pulmonic Valve: The pulmonic valve was normal in structure. Pulmonic valve  regurgitation is trivial. No evidence of pulmonic stenosis.   Aorta: The aortic root is normal in size and structure.   Venous: The inferior vena cava is normal in size with greater than 50%  respiratory variability, suggesting right atrial pressure of 3 mmHg.   IAS/Shunts: No atrial level shunt detected by color flow Doppler.      LEFT VENTRICLE  PLAX 2D  LVIDd:         4.30 cm   Diastology  LVIDs:         1.50 cm   LV e' medial:    3.71 cm/s  LV PW:         1.10 cm   LV E/e' medial:  45.8  LV IVS:        1.10 cm   LV e' lateral:   6.70 cm/s  LVOT diam:     1.80 cm   LV E/e' lateral: 25.4  LV SV:         62  LV SV Index:   34  LVOT Area:     2.54 cm      RIGHT VENTRICLE             IVC  RV Basal diam:  2.80 cm     IVC diam: 2.10 cm  RV S prime:     14.00 cm/s  TAPSE (M-mode): 2.2 cm   LEFT ATRIUM             Index        RIGHT ATRIUM  Index  LA diam:        4.60 cm 2.49 cm/m   RA Area:     14.00 cm  LA Vol (A2C):   73.7 ml 39.96 ml/m  RA Volume:   32.70 ml  17.73 ml/m  LA Vol (A4C):   72.8 ml 39.47 ml/m  LA Biplane Vol: 76.7 ml 41.59 ml/m   AORTIC VALVE                     PULMONIC VALVE  AV Area (Vmax):    0.77 cm      PV Vmax:       1.05 m/s  AV Area (Vmean):   0.77  cm      PV Peak grad:  4.4 mmHg  AV Area (VTI):     0.82 cm  AV Vmax:           396.00 cm/s  AV Vmean:          262.200 cm/s  AV VTI:            0.758 m  AV Peak Grad:      62.7 mmHg  AV Mean Grad:      33.2 mmHg  LVOT Vmax:         120.00 cm/s  LVOT Vmean:        79.500 cm/s  LVOT VTI:          0.245 m  LVOT/AV VTI ratio: 0.32  AI PHT:            426 msec     AORTA  Ao Root diam: 3.10 cm  Ao Asc diam:  3.80 cm   MITRAL VALVE                TRICUSPID VALVE  MV Area (PHT): 2.97 cm     TR Peak grad:   64.6 mmHg  MV Area VTI:   0.99 cm     TR Vmax:        402.00 cm/s  MV Peak grad:  30.2 mmHg  MV Mean grad:  8.0 mmHg     SHUNTS  MV Vmax:       2.75 m/s     Systemic VTI:  0.24 m  MV Vmean:      126.5 cm/s   Systemic Diam: 1.80 cm  MV Decel Time: 255 msec  MV E velocity: 170.00 cm/s  MV A velocity: 127.00 cm/s  MV E/A ratio:  1.34   Candee Furbish MD  Electronically signed by Candee Furbish MD  Signature Date/Time: 12/05/2021/12:51:18 PM         Final     Physicians  Panel Physicians Referring Physician Case Authorizing Physician  Belva Crome, MD (Primary)     Procedures  RIGHT/LEFT HEART CATH AND CORONARY ANGIOGRAPHY   Conclusion  CONCLUSIONS: Irregularities from proximal to distal LAD with 40 to 50% proximal and [place] 0, 1, 1 bifurcation in the mid vessel with 0%, 50%, and 70% stenoses. Left main is widely patent Circumflex is codominant and widely patent Right coronary is codominant with luminal irregularities Mild pulmonary hypertension, WHO group 2 with mean PA pressure 32 mmHg. Peak to peak aortic valve gradient 21 mmHg and mean gradient 25 mmHg.  Calculated aortic valve area 1.1 cm, consistent with moderate mitral stenosis. Transmitral mean gradient 11 mmHg with calculated mitral valve area 1.64 cm Cardiac output by Fick 5.19 L/min and cardiac index 2.83 L/min/m. Mean pulmonary wedge pressure 18  mmHg, LVEDP 15 mmHg, and pulmonary vascular resistance 2.7  Wood units.   RECOMMENDATIONS: Compensated heart failure with patient able to lie flat.  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 Structural heart team evaluating the patient for possible valve procedures.     Surgeon Notes    12/06/2021  6:28 PM CV Procedure signed by Belva Crome, MD   Procedural Details  Technical Details The right radial area was sterilely prepped and draped. Intravenous sedation with Versed and fentanyl was administered. 1% Xylocaine was infiltrated to achieve local analgesia. Using real-time vascular ultrasound, a double wall stick with an angiocath was utilized to obtain intra-arterial access. A VUS image was saved for the permanent record.The modified Seldinger technique was used to place a 74F " Slender" sheath in the right radial artery. Weight based heparin was administered. Coronary angiography was done using 5 F catheters. Right coronary angiography was performed with a JR4. Left ventricular hemodymic recordings were done using the JR 4 catheter after reentering the LV using a 0.035 straight soft wire.  Ventriculography was not performed.  Left coronary angiography was performed with a JL 3.5 cm.  Right heart catheterization was performed by exchanging a previously placed antecubital IV angio-cath for a 5 French Slender sheath. 1% Xylocaine was used to locally nesthetize the area around the IV site. The IV catheter was wired using an .018 guidewire. The modified Seldinger technique was used to place the 5 [place] sheath. Double glove technique was used to enhance sterility. After sheath insertion, right heart cath was performed using a 5 French balloon tipped catheter and fluoroscopic guidance. Pressures were recorded in each chamber and in the pulmonary capillary wedge position.. The main pulmonary artery O2 saturation was sampled.   Hemostasis was achieved using a pneumatic band.  During this procedure the patient is  administered a total of Versed 0.5 mg and Fentanyl 25 mcg to achieve and maintain moderate conscious sedation.  The patient's heart rate, blood pressure, and oxygen saturation are monitored continuously during the procedure. The period of conscious sedation is 29 minutes, of which I was present face-to-face 100% of this time.  Estimated blood loss <50 mL.   During this procedure medications were administered to achieve and maintain moderate conscious sedation while the patient's heart rate, blood pressure, and oxygen saturation were continuously monitored and I was present face-to-face 100% of this time.   Medications (Filter: Administrations occurring from 1700 to 1818 on 12/06/21)  important  Continuous medications are totaled by the amount administered until 12/06/21 1818.   fentaNYL (SUBLIMAZE) injection (mcg) Total dose:  25 mcg  Date/Time Rate/Dose/Volume Action   12/06/21 1727 25 mcg Given    midazolam (VERSED) injection (mg) Total dose:  0.5 mg  Date/Time Rate/Dose/Volume Action   12/06/21 1727 0.5 mg Given    lidocaine (PF) (XYLOCAINE) 1 % injection (mL) Total volume:  2 mL  Date/Time Rate/Dose/Volume Action   12/06/21 1728 2 mL Given    heparin sodium (porcine) injection (Units) Total dose:  4,000 Units  Date/Time Rate/Dose/Volume Action   12/06/21 1744 4,000 Units Given    iohexol (OMNIPAQUE) 350 MG/ML injection (mL) Total volume:  45 mL  Date/Time Rate/Dose/Volume Action   12/06/21 1801 45 mL Given    Heparin (Porcine) in NaCl 1000-0.9 UT/500ML-% SOLN (mL) Total volume:  1,000 mL  Date/Time Rate/Dose/Volume Action   12/06/21 1801 500 mL Given   1801 500 mL Given    Radial Cocktail/Verapamil only (  mL) Total volume:  10 mL  Date/Time Rate/Dose/Volume Action   12/06/21 1732 10 mL Given    Sedation Time  Sedation Time Physician-1: 28 minutes 38 seconds Contrast  Medication Name Total Dose  iohexol (OMNIPAQUE) 350 MG/ML injection 45 mL    Radiation/Fluoro  Fluoro time: 7 (min) DAP: 13150 (mGycm2) Cumulative Air Kerma: 673 (mGy) Complications  Complications documented before study signed (12/06/2021  4:19 PM)   No complications were associated with this study.  Documented by Elveria Royals, RT - 12/06/2021  6:04 PM     Coronary Findings  Diagnostic Dominance: Co-dominant Left Anterior Descending  Vessel is moderate in size. There is mild diffuse disease throughout the vessel.  Mid LAD lesion is 50% stenosed.    First Diagonal Branch  Vessel is small in size.    Second Diagonal Branch  2nd Diag lesion is 70% stenosed.    Left Circumflex  There is mild diffuse disease throughout the vessel.    First Obtuse Marginal Branch  Vessel is small in size.    First Left Posterolateral Branch  Vessel is large in size.    Second Left Posterolateral Branch  Vessel is small in size.    Right Coronary Artery  The vessel exhibits minimal luminal irregularities.    Intervention   No interventions have been documented.   Right Heart  Right Heart Pressures Hemodynamic findings consistent with mild pulmonary hypertension and mitral valve stenosis. LV EDP is normal.   Left Heart  Left Ventricle LV end diastolic pressure is normal.  Mitral Valve There is moderate mitral valve stenosis. The annulus is calcified.  Aortic Valve There is moderate aortic valve stenosis. The aortic valve is calcified. There is restricted aortic valve motion.   Coronary Diagrams  Diagnostic Dominance: Co-dominant  Intervention  Implants     No implant documentation for this case.   Syngo Images   Show images for CARDIAC CATHETERIZATION Images on Long Term Storage   Show images for Polak, DESAREA OHAGAN to Procedure Log  Procedure Log    Hemo Data  Flowsheet Row Most Recent Value  Fick Cardiac Output 5.19 L/min  Fick Cardiac Output Index 2.83 (L/min)/BSA  Aortic Mean Gradient 24.7 mmHg  Aortic Peak Gradient 21.1 mmHg   Aortic Valve Area 1.10  Aortic Value Area Index 0.6 cm2/BSA  Mitral Mean Gradient 13.76 mmHg  Mitral Peak Gradient 7.7 mmHg  Mitral Valve Area Index 0.85 cm2/BSA  RA A Wave 8 mmHg  RA V Wave 4 mmHg  RA Mean 4 mmHg  RV Systolic Pressure 43 mmHg  RV Diastolic Pressure 1 mmHg  RV EDP 9 mmHg  PA Systolic Pressure 44 mmHg  PA Diastolic Pressure 17 mmHg  PA Mean 32 mmHg  PW A Wave 22 mmHg  PW V Wave 28 mmHg  PW Mean 18 mmHg  AO Systolic Pressure 379 mmHg  AO Diastolic Pressure 63 mmHg  AO Mean 89 mmHg  LV Systolic Pressure 024 mmHg  LV Diastolic Pressure 2 mmHg  LV EDP 11 mmHg  AOp Systolic Pressure 097 mmHg  AOp Diastolic Pressure 52 mmHg  AOp Mean Pressure 85 mmHg  LVp Systolic Pressure 353 mmHg  LVp Diastolic Pressure 1 mmHg  LVp EDP Pressure 15 mmHg  QP/QS 1  TPVR Index 11.29 HRUI  TSVR Index 31.4 HRUI  PVR SVR Ratio 0.16  TPVR/TSVR Ratio 0.36   Addendum  ADDENDUM REPORT: 12/07/2021 19:21   CLINICAL DATA:  78 -year-old female with severe aortic stenosis being  evaluated for a TAVR procedure.   EXAM: Cardiac TAVR CT   TECHNIQUE: The patient was scanned on a [employer]. A 120 kV retrospective scan was triggered in the descending thoracic aorta at 111 HU's. Gantry rotation speed was 250 msecs and collimation was .6 mm. No beta blockade or nitro were given. The 3D data set was reconstructed in 5% intervals of the R-R cycle. Systolic and diastolic phases were analyzed on a dedicated work station using MPR, MIP and VRT modes. The patient received 80 cc of contrast.   FINDINGS: Aortic Root:   Aortic valve: Trileaflet   Aortic valve calcium score: 1165   Aortic annulus:   Diameter: 66m x 147m  Perimeter: 644m Area: 305 mm^2   Calcifications: No calcifications   Coronary height: Min Left - 56m20max Left - 18mm26mn Right - 12mm 38mnotubular height: Left cusp - 21mm; 29mt cusp - 17mm; N56mronary cusp - 21mm   L25m(as measured 3 mm  below the annulus):   Diameter: 25mm x 1675m Are55m03 mm^2   Calcifications: No calcifications   Aortic sinus width: Left cusp - 30mm; Right20mp - 27mm; Noncor58my cusp - 31mm   Sinotu78mr junction width: 28mm x 25mm   13mmum 38mroscopic Angle for Delivery: LAO 3 CAU 6   Cardiac:   Right atrium: Mild enlargement   Right ventricle: Mild enlargement   Pulmonary arteries: Normal size   Pulmonary veins: Normal configuration   Left atrium: Mild enlargement   Left ventricle: Normal size   Pericardium: Normal thickness   Mitral valve: Severe mitral annular calcification   Coronary arteries: Calcium score 1657 (95th percentile)   IMPRESSION: 1. Trileaflet aortic valve with moderate calcifications (AV calcium score 1165)   2. Small aortic annulus measuring 24mm x 17mm in d36mter 35m perimeter 64mm and area 305 85m. No annular or LVOT calcification. By annular area, would be appropriate for placement of 23mm Evolut valve, 64mgh by perimeter would size to a 26mm valve. Sinus of66msalva diameter would be borderline for 26mm valve however (m81mres 27mm at right cusp), b17mould be appropriate for 23mm valve. Would discu55mt structural heart conference   3. Sufficient coronary to annulus distance, measuring 12mm to RCA and 56mm to 18m main   4.  O70mum Fluoroscopic Angle for Delivery:  LAO 3 CAU 6   5.  Coronary calcium score 1657 (95th percentile)     Electronically Signed   By: Christopher  Schumann M.D.Oswaldo Milian21    Addended by Schumann, Christopher L, MDonato Heinz   Study Result  Narrative & Impression  EXAM: OVER-READ INTERPRETATION  CT CHEST   The following report is an over-read performed by radiologist Dr. Leah Strickland [facility]. This over[place] does not include interpretation of cardiac or coronary anatomy or pathology. The coronary calcium score/coronary CTA interpretation by  the cardiologist is attached.   COMPARISON:  None Available.   FINDINGS: Extracardiac findings will be described separately under dictation for contemporaneously obtained CTA chest, abdomen and pelvis.   IMPRESSION: Please see separate dictation for contemporaneously obtained CTA chest, abdomen and pelvis dated 12/07/2021 for full description of relevant extracardiac findings.   Electronically Signed: By: Leah  Strickland M.D. On: Yetta Glassman       Narrative & Impression  CLINICAL DATA:  Preop evaluation for aortic valve replacement   EXAM: CT ANGIOGRAPHY CHEST, ABDOMEN AND PELVIS  TECHNIQUE: Non-contrast CT of the chest was initially obtained.   Multidetector CT imaging through the chest, abdomen and pelvis was performed using the standard protocol during bolus administration of intravenous contrast. Multiplanar reconstructed images and MIPs were obtained and reviewed to evaluate the vascular anatomy.   RADIATION DOSE REDUCTION: This exam was performed according to the departmental dose-optimization program which includes automated exposure control, adjustment of the mA and/or kV according to patient size and/or use of iterative reconstruction technique.   CONTRAST:  188m OMNIPAQUE IOHEXOL 350 MG/ML SOLN   COMPARISON:  CT abdomen and pelvis dated February 08, 2018   FINDINGS: CTA CHEST FINDINGS   Cardiovascular: Normal heart size. Pericardial effusion. Left main and three-vessel coronary artery calcifications. Aortic valve thickening and calcifications. Moderate atherosclerotic disease of the thoracic aorta. Standard three-vessel aortic arch with no significant stenosis. No suspicious filling defects of the central pulmonary arteries. Mitral annular calcifications.   Mediastinum/Nodes: Thyroid is unremarkable. Small hiatal hernia. Mildly enlarged mediastinal and hilar lymph nodes, likely reactive. Reference right lower paratracheal lymph node measuring 1.2  cm in short axis on series 4, image 52.   Lungs/Pleura: Central airways are patent. Bilateral central and upper lobe predominant ground-glass opacities with associated interlobular septal thickening. Small right-greater-than-left pleural effusions and bibasilar atelectasis.   Musculoskeletal: New mild age indeterminate compression deformity at T10.   CTA ABDOMEN AND PELVIS FINDINGS   Hepatobiliary: No focal liver abnormality is seen. No gallstones, gallbladder wall thickening, or biliary dilatation.   Pancreas: Unremarkable. No pancreatic ductal dilatation or surrounding inflammatory changes.   Spleen: Normal in size without focal abnormality.   Adrenals/Urinary Tract: Bilateral adrenal glands are unremarkable. No hydronephrosis or nephrolithiasis. Simple appearing cyst of the upper pole of the left kidney, unchanged compared to prior exam.   Stomach/Bowel: Stomach is within normal limits. Appendix appears normal. No evidence of bowel wall thickening, distention, or inflammatory changes.   Vascular/lymphatic: Severe atherosclerotic disease of the abdominal aorta. Normal caliber abdominal aorta. Severe narrowing at the origin of the celiac artery with poststenotic dilatation, due to a combination of calcified and noncalcified plaque. Moderate narrowing at the origin of the SMA due to calcified plaque. Mild narrowing at the origin of the right renal artery due to calcified plaque, left renal arteries widely patent. IMA is patent. No pathologically enlarged lymph nodes seen in the abdomen or pelvis.   Reproductive: No adnexal masses.   Other: No abdominal wall hernia or abnormality. No abdominopelvic ascites.   Musculoskeletal: Interval posterior fusion of L4-L5 similar mild L2 compression deformity new mild age indeterminate T12 and L3 compression deformities.   VASCULAR MEASUREMENTS PERTINENT TO TAVR:   AORTA:   Minimal Aortic Diameter -  13.3 mm   Severity of  Aortic Calcification-severe   RIGHT PELVIS:   Right Common Iliac Artery -   Minimal Diameter-9.3 mm   Tortuosity-none   Calcification-severe   Right External Iliac Artery -   Minimal Diameter-7.2 mm   Tortuosity-mild   Calcification-none   Right Common Femoral Artery -   Minimal Diameter-5.8 mm   Tortuosity-none   Calcification-moderate   LEFT PELVIS:   Left Common Iliac Artery -   Minimal Diameter-9.7 mm   Tortuosity-none   Calcification-mild   Left External Iliac Artery -   Minimal Diameter-7.6 mm   Tortuosity-moderate   Calcification-none   Left Common Femoral Artery -   Minimal Diameter-6.4 mm   Tortuosity-none   Calcification-mild   Review of the MIP images confirms the above findings.  IMPRESSION: 1. Vascular findings and measurements pertinent to potential TAVR procedure, as detailed above. 2. Severe thickening and calcification of the aortic valve, compatible with reported clinical history of severe aortic stenosis. 3. 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 Left main and 3 vessel coronary artery disease. 4. Moderate pulmonary edema and small right-greater-than-left. 5. New mild indeterminate compression deformities at T10, T12, and L3. Correlate for point tenderness.     Electronically Signed   By: Yetta Glassman M.D.   On: 12/07/2021 17:09     Impression:  This 81 year old woman has stage D, severe, symptomatic aortic stenosis with NYHA class II symptoms of exertional fatigue and shortness of breath consistent with chronic diastolic congestive heart failure.  She was admitted in May 2023 with acute on chronic diastolic heart failure responding to diuresis.  I have personally reviewed her 2D echocardiogram, cardiac catheterization, and CTA studies.  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  The mean gradient is 33.2 mmHg with a  valve area of 0.77 cm.  There is also moderate to severe mitral regurgitation and moderate mitral stenosis which may be contributing to her symptoms.  Cardiac catheterization shows nonobstructive coronary disease.  The peak to peak aortic valve gradient was 25 mmHg with a mean transmitral gradient of 11 mmHg.  I agree that aortic valve replacement is indicated in this patient for relief of her symptoms and to prevent left ventricular dysfunction.  She does have significant mitral valve disease but I do not think she is a candidate for open surgical aortic and mitral valve replacements due to the combination of her age and severe arthritis with limited mobility as well as the severity of her mitral annular calcification.  I think that transcatheter aortic valve replacement would be the best option for treating her.  Her gated cardiac CTA shows anatomy suitable for TAVR although she has a relatively small annulus.  Her anatomy appears suitable for a 26 mm Medtronic Evolut valve.  Her abdominal and pelvic CTA shows adequate pelvic vascular anatomy to allow transfemoral insertion.  The patient and her husband were counseled at length regarding treatment alternatives for management of severe symptomatic aortic stenosis. The risks and benefits of surgical intervention has been discussed in detail. Long-term prognosis with medical therapy was discussed. Alternative approaches such as conventional surgical aortic valve replacement, transcatheter aortic valve replacement, and palliative medical therapy were compared and contrasted at length. This discussion was placed in the context of the patient's own specific clinical presentation and past medical history. All of their questions have been addressed.   Following the decision to proceed with transcatheter aortic valve replacement, a discussion was held regarding what types of management strategies would be attempted intraoperatively in the event of life-threatening  complications, including whether or not the patient would be considered a candidate for the use of cardiopulmonary bypass and/or conversion to open sternotomy for attempted surgical intervention.  I do not think she is a candidate for emergent sternotomy to manage any intraoperative complications given the combination of her age, restricted mobility, and moderate to severe mitral valve regurgitation and stenosis with a severely calcified mitral annulus.  The patient is aware of the fact that transient use of cardiopulmonary bypass may be necessary. The patient has been advised of a variety of complications that might develop including but not limited to risks of death, stroke, paravalvular leak, aortic dissection or other major vascular complications, aortic  annulus rupture, device embolization, cardiac rupture or perforation, mitral regurgitation, acute myocardial infarction, arrhythmia, heart block or bradycardia requiring permanent pacemaker placement, congestive heart failure, respiratory failure, renal failure, pneumonia, infection, other late complications related to structural valve deterioration or migration, or other complications that might ultimately cause a temporary or permanent loss of functional independence or other long term morbidity. The patient provides full informed consent for the procedure as described and all questions were answered.        Plan:  She will be scheduled for transfemoral TAVR using a Medtronic valve on 03/21/2022.  I spent 60 minutes performing this consultation and > 50% of this time was spent face to face counseling and coordinating the care of this patient's severe symptomatic aortic stenosis.   Gaye Pollack, MD 03/08/2022

## 2022-03-14 ENCOUNTER — Other Ambulatory Visit: Payer: Self-pay

## 2022-03-14 DIAGNOSIS — I350 Nonrheumatic aortic (valve) stenosis: Secondary | ICD-10-CM

## 2022-03-14 MED ORDER — ASPIRIN 81 MG PO TBEC
81.0000 mg | DELAYED_RELEASE_TABLET | Freq: Every day | ORAL | Status: DC
Start: 2022-03-14 — End: 2024-02-05

## 2022-03-16 NOTE — Pre-Procedure Instructions (Signed)
Surgical Instructions    Your procedure is scheduled on Tuesday, August 22nd.  Report to [facility] Main Entrance "A" at 11:45 A.M., then check in with the Admitting office.  Call this number if you have problems the morning of surgery:  [phone]   If you have any questions prior to your surgery date call [phone]: Open Monday-Friday 8am-4pm    Remember:  Do not eat or drink after midnight the night before your surgery    STOP now taking any Aleve, Naproxen, Ibuprofen, Motrin, Advil, Goody's, BC's, all herbal medications, fish oil, and all non-prescription vitamins.   Continue taking all other medications including Aspirin without change through the day before surgery. On the morning of surgery do not take any medications.                      Do NOT Smoke (Tobacco/Vaping) for 24 hours prior to your procedure.  If you use a CPAP at night, you may bring your mask/headgear for your overnight stay.   Contacts, glasses, piercing's, hearing aid's, dentures or partials may not be worn into surgery, please bring cases for these belongings.    For patients admitted to the hospital, discharge time will be determined by your treatment team.   Patients discharged the day of surgery will not be allowed to drive home, and someone needs to stay with them for 24 hours.  SURGICAL WAITING ROOM VISITATION Patients having surgery or a procedure may have no more than 2 support people in the waiting area - these visitors may rotate.   Children under the age of 21 must have an adult with them who is not the patient. If the patient needs to stay at the hospital during part of their recovery, the visitor guidelines for inpatient rooms apply. Pre-op nurse will coordinate an appropriate time for 1 support person to accompany patient in pre-op.  This support person may not rotate.   Please refer to the [facility] website for the visitor guidelines for Inpatients (after your surgery is over and you  are in a regular room).    Special instructions:   [facility]- Preparing For Surgery  Before surgery, you can play an important role. Because skin is not sterile, your skin needs to be as free of germs as possible. You can reduce the number of germs on your skin by washing with CHG (chlorahexidine gluconate) Soap before surgery.  CHG is an antiseptic cleaner which kills germs and bonds with the skin to continue killing germs even after washing.    Oral Hygiene is also important to reduce your risk of infection.  Remember - BRUSH YOUR TEETH THE MORNING OF SURGERY WITH YOUR REGULAR TOOTHPASTE  Please do not use if you have an allergy to CHG or antibacterial soaps. If your skin becomes reddened/irritated stop using the CHG.  Do not shave (including legs and underarms) for at least 48 hours prior to first CHG shower. It is OK to shave your face.  Please follow these instructions carefully.   Shower the NIGHT BEFORE SURGERY and the MORNING OF SURGERY  If you chose to wash your hair, wash your hair first as usual with your normal shampoo.  After you shampoo, rinse your hair and body thoroughly to remove the shampoo.  Use CHG Soap as you would any other liquid soap. You can apply CHG directly to the skin and wash gently with a scrungie or a clean washcloth.   Apply the CHG Soap to  your body ONLY FROM THE NECK DOWN.  Do not use on open wounds or open sores. Avoid contact with your eyes, ears, mouth and genitals (private parts). Wash Face and genitals (private parts)  with your normal soap.   Wash thoroughly, paying special attention to the area where your surgery will be performed.  Thoroughly rinse your body with warm water from the neck down.  DO NOT shower/wash with your normal soap after using and rinsing off the CHG Soap.  Pat yourself dry with a CLEAN TOWEL.  Wear CLEAN PAJAMAS to bed the night before surgery  Place CLEAN SHEETS on your bed the night before your surgery  DO NOT  SLEEP WITH PETS.   Day of Surgery: Take a shower with CHG soap. Do not wear jewelry or makeup Do not wear lotions, powders, perfumes, or deodorant. Do not shave 48 hours prior to surgery.   Do not bring valuables to the hospital. [facility] is not responsible for any belongings or valuables. Do not wear nail polish, gel polish, artificial nails, or any other type of covering on natural nails (fingers and toes) If you have artificial nails or gel coating that need to be removed by a nail salon, please have this removed prior to surgery. Artificial nails or gel coating may interfere with anesthesia's ability to adequately monitor your vital signs. Wear Clean/Comfortable clothing the morning of surgery Remember to brush your teeth WITH YOUR REGULAR TOOTHPASTE.   Please read over the following fact sheets that you were given.    If you received a COVID test during your pre-op visit  it is requested that you wear a mask when out in public, stay away from anyone that may not be feeling well and notify your surgeon if you develop symptoms. If you have been in contact with anyone that has tested positive in the last 10 days please notify you surgeon.

## 2022-03-17 ENCOUNTER — Encounter (HOSPITAL_COMMUNITY)
Admission: RE | Admit: 2022-03-17 | Discharge: 2022-03-17 | Disposition: A | Discharge status: Home / Self Care | Payer: PPO | Source: Ambulatory Visit | Attending: Cardiovascular Disease | Admitting: Cardiovascular Disease

## 2022-03-17 ENCOUNTER — Ambulatory Visit (HOSPITAL_COMMUNITY)
Admission: RE | Admit: 2022-03-17 | Discharge: 2022-03-17 | Disposition: A | Discharge status: Home / Self Care | Payer: PPO | Source: Ambulatory Visit | Attending: Cardiovascular Disease | Admitting: Cardiovascular Disease

## 2022-03-17 ENCOUNTER — Other Ambulatory Visit: Payer: Self-pay | Admitting: Cardiology

## 2022-03-17 ENCOUNTER — Other Ambulatory Visit: Payer: Self-pay

## 2022-03-17 ENCOUNTER — Encounter (HOSPITAL_COMMUNITY): Payer: Self-pay

## 2022-03-17 ENCOUNTER — Telehealth: Payer: Self-pay | Admitting: Cardiology

## 2022-03-17 VITALS — BP 123/53 | HR 70 | Temp 97.6°F | Resp 18 | Ht 64.0 in | Wt 175.2 lb

## 2022-03-17 DIAGNOSIS — Z8249 Family history of ischemic heart disease and other diseases of the circulatory system: Secondary | ICD-10-CM | POA: Diagnosis not present

## 2022-03-17 DIAGNOSIS — R0602 Shortness of breath: Secondary | ICD-10-CM | POA: Diagnosis not present

## 2022-03-17 DIAGNOSIS — J90 Pleural effusion, not elsewhere classified: Secondary | ICD-10-CM | POA: Diagnosis not present

## 2022-03-17 DIAGNOSIS — J810 Acute pulmonary edema: Secondary | ICD-10-CM | POA: Diagnosis not present

## 2022-03-17 DIAGNOSIS — Z20822 Contact with and (suspected) exposure to covid-19: Secondary | ICD-10-CM | POA: Insufficient documentation

## 2022-03-17 DIAGNOSIS — Z8673 Personal history of transient ischemic attack (TIA), and cerebral infarction without residual deficits: Secondary | ICD-10-CM | POA: Diagnosis not present

## 2022-03-17 DIAGNOSIS — Z882 Allergy status to sulfonamides status: Secondary | ICD-10-CM | POA: Diagnosis not present

## 2022-03-17 DIAGNOSIS — Z886 Allergy status to analgesic agent status: Secondary | ICD-10-CM | POA: Diagnosis not present

## 2022-03-17 DIAGNOSIS — Z881 Allergy status to other antibiotic agents status: Secondary | ICD-10-CM | POA: Diagnosis not present

## 2022-03-17 DIAGNOSIS — I083 Combined rheumatic disorders of mitral, aortic and tricuspid valves: Secondary | ICD-10-CM | POA: Diagnosis not present

## 2022-03-17 DIAGNOSIS — Z85820 Personal history of malignant melanoma of skin: Secondary | ICD-10-CM | POA: Diagnosis not present

## 2022-03-17 DIAGNOSIS — Z79899 Other long term (current) drug therapy: Secondary | ICD-10-CM | POA: Diagnosis not present

## 2022-03-17 DIAGNOSIS — I350 Nonrheumatic aortic (valve) stenosis: Secondary | ICD-10-CM | POA: Insufficient documentation

## 2022-03-17 DIAGNOSIS — I2722 Pulmonary hypertension due to left heart disease: Secondary | ICD-10-CM | POA: Diagnosis not present

## 2022-03-17 DIAGNOSIS — R059 Cough, unspecified: Secondary | ICD-10-CM | POA: Diagnosis not present

## 2022-03-17 DIAGNOSIS — Z888 Allergy status to other drugs, medicaments and biological substances status: Secondary | ICD-10-CM | POA: Diagnosis not present

## 2022-03-17 DIAGNOSIS — I5033 Acute on chronic diastolic (congestive) heart failure: Secondary | ICD-10-CM | POA: Diagnosis not present

## 2022-03-17 DIAGNOSIS — N390 Urinary tract infection, site not specified: Secondary | ICD-10-CM | POA: Diagnosis not present

## 2022-03-17 DIAGNOSIS — Z01818 Encounter for other preprocedural examination: Secondary | ICD-10-CM | POA: Insufficient documentation

## 2022-03-17 DIAGNOSIS — I2510 Atherosclerotic heart disease of native coronary artery without angina pectoris: Secondary | ICD-10-CM | POA: Diagnosis not present

## 2022-03-17 DIAGNOSIS — Z006 Encounter for examination for normal comparison and control in clinical research program: Secondary | ICD-10-CM | POA: Diagnosis not present

## 2022-03-17 DIAGNOSIS — J811 Chronic pulmonary edema: Secondary | ICD-10-CM | POA: Diagnosis not present

## 2022-03-17 DIAGNOSIS — K219 Gastro-esophageal reflux disease without esophagitis: Secondary | ICD-10-CM | POA: Diagnosis not present

## 2022-03-17 DIAGNOSIS — R531 Weakness: Secondary | ICD-10-CM | POA: Diagnosis not present

## 2022-03-17 DIAGNOSIS — I110 Hypertensive heart disease with heart failure: Secondary | ICD-10-CM | POA: Diagnosis not present

## 2022-03-17 DIAGNOSIS — Z7982 Long term (current) use of aspirin: Secondary | ICD-10-CM | POA: Diagnosis not present

## 2022-03-17 DIAGNOSIS — M7989 Other specified soft tissue disorders: Secondary | ICD-10-CM | POA: Diagnosis not present

## 2022-03-17 LAB — COMPREHENSIVE METABOLIC PANEL
ALT: 24 U/L (ref 0–44)
AST: 29 U/L (ref 15–41)
Albumin: 3.8 g/dL (ref 3.5–5.0)
Alkaline Phosphatase: 69 U/L (ref 38–126)
Anion gap: 8 (ref 5–15)
BUN: 19 mg/dL (ref 8–23)
CO2: 24 mmol/L (ref 22–32)
Calcium: 9.4 mg/dL (ref 8.9–10.3)
Chloride: 107 mmol/L (ref 98–111)
Creatinine, Ser: 0.8 mg/dL (ref 0.44–1.00)
GFR, Estimated: >60 mL/min (ref >60)
Glucose, Bld: 95 mg/dL (ref 70–99)
Potassium: 3.9 mmol/L (ref 3.5–5.1)
Sodium: 139 mmol/L (ref 135–145)
Total Bilirubin: 0.9 mg/dL (ref 0.3–1.2)
Total Protein: 6.7 g/dL (ref 6.5–8.1)

## 2022-03-17 LAB — CBC
HCT: 35.9 % — ABNORMAL LOW (ref 36.0–46.0)
Hemoglobin: 12.2 g/dL (ref 12.0–15.0)
MCH: 33.1 pg (ref 26.0–34.0)
MCHC: 34 g/dL (ref 30.0–36.0)
MCV: 97.3 fL (ref 80.0–100.0)
Platelets: 206 10*3/uL (ref 150–400)
RBC: 3.69 MIL/uL — ABNORMAL LOW (ref 3.87–5.11)
RDW: 13.2 % (ref 11.5–15.5)
WBC: 8.2 10*3/uL (ref 4.0–10.5)
nRBC: 0 % (ref 0.0–0.2)

## 2022-03-17 LAB — URINALYSIS, ROUTINE W REFLEX MICROSCOPIC
Bilirubin Urine: NEGATIVE
Glucose, UA: NEGATIVE mg/dL
Hgb urine dipstick: NEGATIVE
Ketones, ur: NEGATIVE mg/dL
Nitrite: POSITIVE — AB
Protein, ur: NEGATIVE mg/dL
Specific Gravity, Urine: 1.024 (ref 1.005–1.030)
pH: 5 (ref 5.0–8.0)

## 2022-03-17 LAB — TYPE AND SCREEN
ABO/RH(D): A NEG
Antibody Screen: NEGATIVE

## 2022-03-17 LAB — PROTIME-INR
INR: 1.1 (ref 0.8–1.2)
Prothrombin Time: 13.9 seconds (ref 11.4–15.2)

## 2022-03-17 LAB — SURGICAL PCR SCREEN
MRSA, PCR: NEGATIVE
Staphylococcus aureus: NEGATIVE

## 2022-03-17 LAB — SARS CORONAVIRUS 2 (TAT 6-24 HRS): SARS Coronavirus 2: NEGATIVE

## 2022-03-17 MED ORDER — NITROFURANTOIN MONOHYD MACRO 100 MG PO CAPS: 100.0000 mg | ORAL_CAPSULE | Freq: Two times a day (BID) | ORAL | 0 refills | Status: DC

## 2022-03-17 NOTE — Progress Notes (Signed)
PCP - Velna Hatchet Cardiologist - Dr. Percival Spanish with [facility]  PPM/ICD - Denies  Chest x-ray - 03/17/22 EKG - 03/17/22 Stress Test - 07/18/18 ECHO - 12/05/21 Cardiac Cath -12/06/21   Sleep Study - Denies  DM - Denies  Blood Thinner Instructions:Denies Aspirin Instructions:Instructed to continue through to day before surgery and then nothing on day of surgery.     COVID TEST- 03/17/22   Anesthesia review: Yes cardiac history recent admission for cardiac cath  Patient denies fever, cough and chest pain at PAT appointment. Patient has shortness of breath at baseline.    All instructions explained to the patient, with a verbal understanding of the material. Patient agrees to go over the instructions while at home for a better understanding. Patient also instructed to wear a mask while in public after being tested for COVID-19. The opportunity to ask questions was provided.

## 2022-03-17 NOTE — Progress Notes (Signed)
Sent staff message to Dr. Julianne Handler regarding patient's abnormal UA

## 2022-03-17 NOTE — Telephone Encounter (Signed)
  [facility] VALVE TEAM   Patient noted to have large amounts of bacteria with nitrates and leukocytes on pre TAVR UA. Patient called with no symptoms reported. Will plan to treat with Macrobid '100mg'$  BID x 5 days.   [name] NP-C Structural Heart Team  Pager: [phone] Phone: [phone]

## 2022-03-19 ENCOUNTER — Emergency Department (HOSPITAL_COMMUNITY): Payer: PPO

## 2022-03-19 ENCOUNTER — Other Ambulatory Visit: Payer: Self-pay

## 2022-03-19 ENCOUNTER — Encounter (HOSPITAL_COMMUNITY): Payer: Self-pay | Admitting: Emergency Medicine

## 2022-03-19 ENCOUNTER — Inpatient Hospital Stay (HOSPITAL_COMMUNITY)
Admission: EM | Admit: 2022-03-19 | Discharge: 2022-03-19 | DRG: 291 | Disposition: A | Discharge status: Home / Self Care | Payer: PPO | Attending: Cardiovascular Disease | Admitting: Cardiovascular Disease

## 2022-03-19 ENCOUNTER — Emergency Department (HOSPITAL_COMMUNITY)
Admit: 2022-03-19 | Discharge: 2022-03-19 | Disposition: A | Discharge status: Home / Self Care | Payer: PPO | Attending: Emergency Medicine | Admitting: Emergency Medicine

## 2022-03-19 DIAGNOSIS — Z79899 Other long term (current) drug therapy: Secondary | ICD-10-CM

## 2022-03-19 DIAGNOSIS — Z886 Allergy status to analgesic agent status: Secondary | ICD-10-CM | POA: Diagnosis not present

## 2022-03-19 DIAGNOSIS — M7989 Other specified soft tissue disorders: Secondary | ICD-10-CM | POA: Diagnosis not present

## 2022-03-19 DIAGNOSIS — I2722 Pulmonary hypertension due to left heart disease: Secondary | ICD-10-CM | POA: Diagnosis present

## 2022-03-19 DIAGNOSIS — I5033 Acute on chronic diastolic (congestive) heart failure: Secondary | ICD-10-CM

## 2022-03-19 DIAGNOSIS — Z85820 Personal history of malignant melanoma of skin: Secondary | ICD-10-CM

## 2022-03-19 DIAGNOSIS — Z882 Allergy status to sulfonamides status: Secondary | ICD-10-CM

## 2022-03-19 DIAGNOSIS — I110 Hypertensive heart disease with heart failure: Secondary | ICD-10-CM | POA: Diagnosis present

## 2022-03-19 DIAGNOSIS — Z8673 Personal history of transient ischemic attack (TIA), and cerebral infarction without residual deficits: Secondary | ICD-10-CM | POA: Diagnosis not present

## 2022-03-19 DIAGNOSIS — Z20822 Contact with and (suspected) exposure to covid-19: Secondary | ICD-10-CM | POA: Diagnosis present

## 2022-03-19 DIAGNOSIS — Z8249 Family history of ischemic heart disease and other diseases of the circulatory system: Secondary | ICD-10-CM | POA: Diagnosis not present

## 2022-03-19 DIAGNOSIS — I2510 Atherosclerotic heart disease of native coronary artery without angina pectoris: Secondary | ICD-10-CM | POA: Diagnosis present

## 2022-03-19 DIAGNOSIS — I509 Heart failure, unspecified: Secondary | ICD-10-CM

## 2022-03-19 DIAGNOSIS — I350 Nonrheumatic aortic (valve) stenosis: Secondary | ICD-10-CM | POA: Diagnosis not present

## 2022-03-19 DIAGNOSIS — K219 Gastro-esophageal reflux disease without esophagitis: Secondary | ICD-10-CM | POA: Diagnosis present

## 2022-03-19 DIAGNOSIS — Z7982 Long term (current) use of aspirin: Secondary | ICD-10-CM

## 2022-03-19 DIAGNOSIS — J810 Acute pulmonary edema: Principal | ICD-10-CM

## 2022-03-19 DIAGNOSIS — I083 Combined rheumatic disorders of mitral, aortic and tricuspid valves: Secondary | ICD-10-CM | POA: Diagnosis present

## 2022-03-19 DIAGNOSIS — N390 Urinary tract infection, site not specified: Secondary | ICD-10-CM | POA: Diagnosis present

## 2022-03-19 DIAGNOSIS — Z888 Allergy status to other drugs, medicaments and biological substances status: Secondary | ICD-10-CM | POA: Diagnosis not present

## 2022-03-19 DIAGNOSIS — Z881 Allergy status to other antibiotic agents status: Secondary | ICD-10-CM | POA: Diagnosis not present

## 2022-03-19 DIAGNOSIS — Z006 Encounter for examination for normal comparison and control in clinical research program: Secondary | ICD-10-CM | POA: Diagnosis not present

## 2022-03-19 LAB — CBC
HCT: 35.5 % — ABNORMAL LOW (ref 36.0–46.0)
Hemoglobin: 11.7 g/dL — ABNORMAL LOW (ref 12.0–15.0)
MCH: 32.4 pg (ref 26.0–34.0)
MCHC: 33 g/dL (ref 30.0–36.0)
MCV: 98.3 fL (ref 80.0–100.0)
Platelets: 203 10*3/uL (ref 150–400)
RBC: 3.61 MIL/uL — ABNORMAL LOW (ref 3.87–5.11)
RDW: 13.1 % (ref 11.5–15.5)
WBC: 8.9 10*3/uL (ref 4.0–10.5)
nRBC: 0 % (ref 0.0–0.2)

## 2022-03-19 LAB — TROPONIN I (HIGH SENSITIVITY)
Troponin I (High Sensitivity): 11 ng/L (ref <18)
Troponin I (High Sensitivity): 12 ng/L (ref <18)

## 2022-03-19 LAB — BRAIN NATRIURETIC PEPTIDE: B Natriuretic Peptide: 336.8 pg/mL — ABNORMAL HIGH (ref 0.0–100.0)

## 2022-03-19 LAB — BASIC METABOLIC PANEL
Anion gap: 7 (ref 5–15)
BUN: 12 mg/dL (ref 8–23)
CO2: 22 mmol/L (ref 22–32)
Calcium: 9.1 mg/dL (ref 8.9–10.3)
Chloride: 108 mmol/L (ref 98–111)
Creatinine, Ser: 0.75 mg/dL (ref 0.44–1.00)
GFR, Estimated: >60 mL/min (ref >60)
Glucose, Bld: 121 mg/dL — ABNORMAL HIGH (ref 70–99)
Potassium: 3.8 mmol/L (ref 3.5–5.1)
Sodium: 137 mmol/L (ref 135–145)

## 2022-03-19 LAB — MAGNESIUM: Magnesium: 2.1 mg/dL (ref 1.7–2.4)

## 2022-03-19 MED ORDER — SODIUM CHLORIDE 0.9 % IV SOLN: 250.0000 mL | INTRAVENOUS | Status: DC | PRN

## 2022-03-19 MED ORDER — HEPARIN SODIUM (PORCINE) 5000 UNIT/ML IJ SOLN: 5000.0000 [IU] | Freq: Three times a day (TID) | INTRAMUSCULAR | Status: DC

## 2022-03-19 MED ORDER — HYPROMELLOSE (GONIOSCOPIC) 2.5 % OP SOLN: 1.0000 [drp] | OPHTHALMIC | Status: DC | PRN

## 2022-03-19 MED ORDER — ROSUVASTATIN CALCIUM 20 MG PO TABS
20.0000 mg | ORAL_TABLET | Freq: Every day | ORAL | Status: DC
  Administered 2022-03-19: 20 mg via ORAL
  Filled 2022-03-19: qty 1

## 2022-03-19 MED ORDER — NITROFURANTOIN MACROCRYSTAL 100 MG PO CAPS
100.0000 mg | ORAL_CAPSULE | Freq: Two times a day (BID) | ORAL | Status: DC
  Filled 2022-03-19 (×3): qty 1

## 2022-03-19 MED ORDER — ACETAMINOPHEN 325 MG PO TABS: 650.0000 mg | ORAL_TABLET | ORAL | Status: DC | PRN

## 2022-03-19 MED ORDER — ONDANSETRON HCL 4 MG/2ML IJ SOLN: 4.0000 mg | Freq: Four times a day (QID) | INTRAMUSCULAR | Status: DC | PRN

## 2022-03-19 MED ORDER — PRAMIPEXOLE DIHYDROCHLORIDE 0.25 MG PO TABS
0.2500 mg | ORAL_TABLET | Freq: Two times a day (BID) | ORAL | Status: DC
  Administered 2022-03-19: 0.25 mg via ORAL
  Filled 2022-03-19 (×2): qty 1

## 2022-03-19 MED ORDER — FUROSEMIDE 10 MG/ML IJ SOLN
40.0000 mg | Freq: Two times a day (BID) | INTRAMUSCULAR | Status: DC
Start: 2022-03-19 — End: 2022-03-19

## 2022-03-19 MED ORDER — FUROSEMIDE 10 MG/ML IJ SOLN
40.0000 mg | Freq: Once | INTRAMUSCULAR | Status: AC
  Administered 2022-03-19: 40 mg via INTRAVENOUS
  Filled 2022-03-19: qty 4

## 2022-03-19 MED ORDER — NITROFURANTOIN MONOHYD MACRO 100 MG PO CAPS
100.0000 mg | ORAL_CAPSULE | Freq: Two times a day (BID) | ORAL | Status: DC
Start: 2022-03-19 — End: 2022-03-19
  Administered 2022-03-19: 100 mg via ORAL
  Filled 2022-03-19 (×3): qty 1

## 2022-03-19 NOTE — ED Triage Notes (Signed)
Patient reports persistent SOB for 3 days worse when lying on bed and exertion , occasional dry cough . Scheduled for aortic valve replacement on 03/21/22 . Her cardiologist is Dr. Leo Rod .

## 2022-03-19 NOTE — ED Notes (Signed)
Family at bedside. 

## 2022-03-19 NOTE — ED Notes (Signed)
PT states "I feel better I would like to go home if possible." This writer tried to encourage pt about risks and benefits of going home. Reached out to provider.

## 2022-03-19 NOTE — ED Notes (Signed)
ED Provider at bedside. 

## 2022-03-19 NOTE — ED Provider Notes (Addendum)
[facility] EMERGENCY DEPARTMENT Provider Note   CSN: [id number] Arrival date & time: 03/19/22  4401     History  Chief Complaint  Patient presents with   Shortness of Breath    Tiffany Washington is a 81 y.o. female.   Shortness of Breath Associated symptoms: cough   Patient presents for shortness of breath.  Medical history includes aortic stenosis, arthritis, HTN, GERD, mitral regurgitation, aortic insufficiency, RLS, ICH, CVA, CHF.  Shortness of breath has been present over the past 3 days.  It is worse with exertion and laying flat.  She has had an occasional dry cough.  She has a planned aortic valve replacement scheduled for Tuesday.  In preparation for her upcoming procedure, she was told to discontinue home medications starting on Monday.  She did stop taking her home medications, including her Lasix.  Since that time, her symptoms of dyspnea have been progressing.  Symptoms are reminiscent of prior episodes of pulmonary edema.  She has had some increased swelling to her distal right lower extremity.  She denies any other areas of swelling.     Home Medications Prior to Admission medications   Medication Sig Start Date End Date Taking? Authorizing Provider  acetaminophen (TYLENOL) 325 MG tablet Take 650 mg by mouth every 6 (six) hours as needed for moderate pain or headache.    [provider]  Ascorbic Acid (VITAMIN C) 1000 MG tablet Take 1,000 mg by mouth daily. Patient not taking: Reported on 03/16/2022    [provider]  aspirin EC 81 MG tablet Take 1 tablet (81 mg total) by mouth daily. Swallow whole. 03/14/22   Burnell Blanks, MD  B Complex Vitamins (B COMPLEX 100 PO) Take 1 tablet by mouth daily. Patient not taking: Reported on 03/16/2022    [provider]  furosemide (LASIX) 40 MG tablet Take 1 tablet (40 mg total) by mouth daily. Patient not taking: Reported on 03/16/2022 01/02/22   Eileen Stanford, PA-C   hydroxypropyl methylcellulose / hypromellose (ISOPTO TEARS / GONIOVISC) 2.5 % ophthalmic solution 1 drop as needed for dry eyes.    [provider]  Multiple Vitamins-Minerals (MULTIVITAMIN WITH MINERALS) tablet Take 1 tablet by mouth daily.  Patient not taking: Reported on 03/16/2022    [provider]  nitrofurantoin, macrocrystal-monohydrate, (MACROBID) 100 MG capsule Take 1 capsule (100 mg total) by mouth 2 (two) times daily for 5 days. 03/17/22 03/22/22  [name] D, NP  potassium chloride SA (KLOR-CON M) 20 MEQ tablet Take 1 tablet (20 mEq total) by mouth daily. Patient not taking: Reported on 03/16/2022 01/02/22   Eileen Stanford, PA-C  pramipexole (MIRAPEX) 0.25 MG tablet Take 0.25 mg by mouth 2 (two) times daily.     [provider]  rosuvastatin (CRESTOR) 20 MG tablet Take 1 tablet (20 mg total) by mouth daily. 01/07/21   Einar Pheasant, NP  Zinc 30 MG CAPS Take 30 mg by mouth daily. Patient not taking: Reported on 03/16/2022    [provider]      Allergies    Carbamazepine, Ciprofloxacin, Diclofenac, Indomethacin, Meloxicam, Nortriptyline, Voltaren [diclofenac sodium], Gabapentin, Pamelor [nortriptyline hcl], Ropinirole, and Sulfa antibiotics    Review of Systems   Review of Systems  Respiratory:  Positive for cough and shortness of breath.   All other systems reviewed and are negative.   Physical Exam Updated Vital Signs BP 134/74 (BP Location: Right Arm)   Pulse 86   Temp 98.6  F (37 C) (Oral)   Resp (!) 28   Ht '5\' 4"'$  (1.626 m)   Wt 76.2 kg   SpO2 92%   BMI 28.84 kg/m  Physical Exam Vitals and nursing note reviewed.  Constitutional:      General: She is not in acute distress.    Appearance: She is well-developed. She is not toxic-appearing or diaphoretic.  HENT:     Head: Normocephalic and atraumatic.     Mouth/Throat:     Mouth: Mucous membranes are moist.     Pharynx: Oropharynx is clear.  Eyes:     Extraocular  Movements: Extraocular movements intact.     Conjunctiva/sclera: Conjunctivae normal.  Cardiovascular:     Rate and Rhythm: Normal rate and regular rhythm.     Heart sounds: No murmur heard. Pulmonary:     Effort: Tachypnea present. No respiratory distress.     Breath sounds: Rales present. No wheezing.  Abdominal:     Palpations: Abdomen is soft.     Tenderness: There is no abdominal tenderness.  Musculoskeletal:        General: No swelling. Normal range of motion.     Cervical back: Normal range of motion and neck supple.     Right lower leg: No tenderness. Edema present.     Left lower leg: No tenderness. No edema.  Skin:    General: Skin is warm and dry.     Capillary Refill: Capillary refill takes less than 2 seconds.     Coloration: Skin is not cyanotic or pale.  Neurological:     General: No focal deficit present.     Mental Status: She is alert and oriented to person, place, and time.  Psychiatric:        Mood and Affect: Mood normal.        Behavior: Behavior normal.     ED Results / Procedures / Treatments   Labs (all labs ordered are listed, but only abnormal results are displayed) Labs Reviewed  BASIC METABOLIC PANEL - Abnormal; Notable for the following components:      Result Value   Glucose, Bld 121 (*)    All other components within normal limits  CBC - Abnormal; Notable for the following components:   RBC 3.61 (*)    Hemoglobin 11.7 (*)    HCT 35.5 (*)    All other components within normal limits  BRAIN NATRIURETIC PEPTIDE  MAGNESIUM  TROPONIN I (HIGH SENSITIVITY)  TROPONIN I (HIGH SENSITIVITY)    EKG EKG Interpretation  Date/Time:  Sunday March 19 2022 08:47:45 EDT Ventricular Rate:  84 PR Interval:  150 QRS Duration: 79 QT Interval:  380 QTC Calculation: 450 R Axis:   58 Text Interpretation: Sinus rhythm Atrial premature complex Anteroseptal infarct, age indeterminate Confirmed by Godfrey Pick [phone]) on 03/19/2022 9:30:59 AM  Radiology DG  Chest 2 View  Result Date: 03/19/2022 CLINICAL DATA:  Pre-admission to transcatheter aortic valve repair, cough. EXAM: CHEST - 2 VIEW COMPARISON:  Dec 04, 2021. FINDINGS: The heart size and mediastinal contours are within normal limits. Moderate bilateral perihilar and basilar interstitial densities are noted consistent with pulmonary edema. No pneumothorax or pleural effusion is noted. Old lower thoracic compression fracture is noted. IMPRESSION: Moderate bilateral pulmonary edema is noted. Electronically Signed   By: Marijo Conception M.D.   On: 03/19/2022 09:18   DG Chest 2 View  Result Date: 03/19/2022 CLINICAL DATA:  Short of breath and weakness. EXAM: CHEST - 2 VIEW COMPARISON:  03/17/2022 FINDINGS: Stable cardiomediastinal contours. Interval development of bilateral pleural effusions, left greater than right. Mild diffuse interstitial edema pattern is similar to previous exam. No airspace opacities. IMPRESSION: Progressive CHF pattern with new bilateral pleural effusions and persistent interstitial edema. Electronically Signed   By: Kerby Moors M.D.   On: 03/19/2022 07:11    Procedures Procedures    Medications Ordered in ED Medications  furosemide (LASIX) injection 40 mg (has no administration in time range)    ED Course/ Medical Decision Making/ A&P                           Medical Decision Making Amount and/or Complexity of Data Reviewed Labs: ordered. Radiology: ordered.  Risk Prescription drug management.   This patient presents to the ED for concern of shortness of breath, this involves an extensive number of treatment options, and is a complaint that carries with it a high risk of complications and morbidity.  The differential diagnosis includes cardiogenic pulmonary edema, pneumonia, PE, reactive airway disease, anemia   Co morbidities that complicate the patient evaluation  aortic stenosis, arthritis, HTN, GERD, mitral regurgitation, aortic insufficiency, RLS, ICH,  CVA, CHF   Additional history obtained:  Additional history obtained from N/A External records from outside source obtained and reviewed including EMR   Lab Tests:  I Ordered, and personally interpreted labs.  The pertinent results include: Initial lab work shows normal electrolytes and troponin.  No leukocytosis is present.  Patient's hemoglobin has down trended from 3 months ago.   Imaging Studies ordered:  I ordered imaging studies including chest x-ray I independently visualized and interpreted imaging which showed findings consistent with CHF I agree with the radiologist interpretation   Cardiac Monitoring: / EKG:  The patient was maintained on a cardiac monitor.  I personally viewed and interpreted the cardiac monitored which showed an underlying rhythm of: Sinus rhythm   Consultations Obtained:  I requested consultation with the cardiologist, Dr. Harl Bowie,  and discussed lab and imaging findings as well as pertinent plan - they recommend: IV Lasix and cardiology will admit   Problem List / ED Course / Critical interventions / Medication management  Patient presents for shortness of breath over the past 3 days.  Vital signs are normal upon arrival.  Prior being bedded in the ED, initial lab work was obtained.  Lab work shows downtrending anemia when compared to 3 months ago.  She has no leukocytosis.  Electrolytes are normal.  Initial troponin is normal as well.  Chest x-ray shows findings consistent with CHF.  She is prescribed 40 mg of Lasix daily.  She states that she was told to discontinue home medications starting on Monday (6 days ago).  She stopped taking home medications, including her Lasix.  Vital signs on arrival are notable for tachypnea.  On assessment, patient has mildly increased work of breathing.  Crackles are present on lung auscultation.  On bedside ultrasound, she does have biapical B-lines.  Presentation is consistent with cardiogenic pulmonary edema.  I  discussed this with cardiologist on-call, Dr. Harl Bowie, who advised IV Lasix in the ED and cardiology will admit.  Patient additionally endorses swelling to her right lower extremity.  The swelling is mild but will order DVT study.  Patient was admitted for further management. Following admission, patient had good urine output while in the ED.  She had improvement in her shortness of breath.  She informed her admitting providers that she would  like to go home.  Cardiology team reached out to request patient be discharged from the emergency department.  Patient to continue Lasix at home and keep her appointments for this week. On reassessment, patient is breathing comfortably, even with ambulation.  SPO2 is 92% on room air.  She does confirm that she wishes to be discharged home.  Patient was discharged in stable condition. I ordered medication including Lasix for diuresis Reevaluation of the patient after these medicines showed that the patient stayed the same I have reviewed the patients home medicines and have made adjustments as needed   Social Determinants of Health:  Has access to outpatient care, including cardiology         Final Clinical Impression(s) / ED Diagnoses Final diagnoses:  Acute pulmonary edema [facility])    Rx / DC Orders ED Discharge Orders     None         Godfrey Pick, MD 03/19/22 3536    Godfrey Pick, MD 03/19/22 1343

## 2022-03-19 NOTE — H&P (Addendum)
Cardiology Admission History and Physical:   Patient ID: Tiffany Washington MRN: [id number]; DOB: August 04, 1940   Admission date: 03/19/2022  PCP:  Velna Hatchet, MD   [facility] Providers Cardiologist:  Minus Breeding, MD   Chief Complaint:  pulmonary edema, aortic stenosis  Patient Profile:   Tiffany Washington is a 81 y.o. female with aortic stenosis, intracranial hemorrhage 12/2020 felt hypertensive in etiology, nonobstructive CAD, hypertension, arthritis, and limited mobility who is being seen 03/19/2022 for the evaluation of pulmonary edema.  History of Present Illness:   [name] was admitted May 2023 for acute hypoxic respiratory failure secondary to acute diastolic heart failure and severe aortic stenosis.  Echocardiogram showed a hyperdynamic LVEF greater than 75% with G2 DD, degenerative MV, moderate to severe MR, moderate MS, moderate TR, mild AI, and moderate to severe aortic stenosis which appears to have progressed since prior echocardiogram.  Evaluation by the structural heart team was recommended.  She underwent [facility] 12/06/2021 with nonobstructive CAD, RA mean 4, PA mean 32 mmHg, PCWP mean 18 mmHg, LVEDP 15 mmHg, PVR 2.7 WU, Fick CO/CI 5.19/2.83, peak to peak aortic valve mean gradient 25 mmHg, transmitral mean gradient 11 mmHg with MV area 1.64 cm2.  Decision was made to move forward with TAVR.  Of note UA positive for bacteria with nitrites and leukocytes.  Planned to started on Macrobid 100 mg twice daily x5 days on 03/17/2022.  She is also followed in the advanced heart failure clinic and was last seen 12/19/2021.  She has been managed with 40 mg torsemide daily, but was transition to Lasix on 01/02/2022.Marland Kitchen  Lisinopril was discontinued.  Admission for TAVR was planned on 03/21/2022.  Unfortunately she presented to [facility] ED 03/19/2022 with shortness of breath x3 days.  In preparation for her TAVR, she was instructed to discontinue home medications starting on Monday.  She stopped  taking her home medications including her Lasix.  Since that time, her dyspnea has been progressing.  CXR significant for new bilateral pleural effusions and persistent interstitial edema.  She received 40 mg IV Lasix x1.   During my interview, she reports being contacted last Monday by our team and instructed to stop her lasix that day. Last dose of lasix was Monday. She quickly developed orthopnea and PND. She presented after not sleeping for 3 days due to dyspnea. She is not particularly bothered by dyspnea during the day or with exertion. No chest pain, no syncope.    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)    Mild carotid artery disease (HCC)    Mitral regurgitation    Restless leg syndrome    Spondylolisthesis of lumbar region     Past Surgical History:  Procedure Laterality Date   BACK SURGERY  10/2019   CESAREAN SECTION     COLONOSCOPY     COLONOSCOPY W/ BIOPSIES AND POLYPECTOMY     KNEE SURGERY Bilateral    Bilateral   MULTIPLE TOOTH EXTRACTIONS     POLYPECTOMY     RIGHT/LEFT HEART CATH AND CORONARY ANGIOGRAPHY N/A 12/06/2021   Procedure: RIGHT/LEFT HEART CATH AND CORONARY ANGIOGRAPHY;  Surgeon: Belva Crome, MD;  Location: [facility];  Service: Cardiovascular;  Laterality: N/A;   UPPER GASTROINTESTINAL ENDOSCOPY     VAGINAL HYSTERECTOMY       Medications Prior to Admission: Prior to Admission medications   Medication Sig Start Date End Date Taking? Authorizing Provider  acetaminophen (TYLENOL) 325 MG tablet Take 650 mg by mouth every 6 (six) hours as needed for moderate pain or headache.    [provider]  Ascorbic Acid (VITAMIN C) 1000 MG tablet Take 1,000 mg by mouth daily. Patient not taking:  Reported on 03/16/2022    [provider]  aspirin EC 81 MG tablet Take 1 tablet (81 mg total) by mouth daily. Swallow whole. 03/14/22   Burnell Blanks, MD  B Complex Vitamins (B COMPLEX 100 PO) Take 1 tablet by mouth daily. Patient not taking: Reported on 03/16/2022    [provider]  furosemide (LASIX) 40 MG tablet Take 1 tablet (40 mg total) by mouth daily. Patient not taking: Reported on 03/16/2022 01/02/22   Eileen Stanford, PA-C  hydroxypropyl methylcellulose / hypromellose (ISOPTO TEARS / GONIOVISC) 2.5 % ophthalmic solution 1 drop as needed for dry eyes.    [provider]  Multiple Vitamins-Minerals (MULTIVITAMIN WITH MINERALS) tablet Take 1 tablet by mouth daily.  Patient not taking: Reported on 03/16/2022    [provider]  nitrofurantoin, macrocrystal-monohydrate, (MACROBID) 100 MG capsule Take 1 capsule (100 mg total) by mouth 2 (two) times daily for 5 days. 03/17/22 03/22/22  [name] D, NP  potassium chloride SA (KLOR-CON M) 20 MEQ tablet Take 1 tablet (20 mEq total) by mouth daily. Patient not taking: Reported on 03/16/2022 01/02/22   Eileen Stanford, PA-C  pramipexole (MIRAPEX) 0.25 MG tablet Take 0.25 mg by mouth 2 (two) times daily.     [provider]  rosuvastatin (CRESTOR) 20 MG tablet Take 1 tablet (20 mg total) by mouth daily. 01/07/21   Einar Pheasant, NP  Zinc 30 MG CAPS Take 30 mg by mouth daily. Patient not taking: Reported on 03/16/2022    [provider]     Allergies:    Allergies  Allergen Reactions   Carbamazepine Other (See Comments)    Doesn't remember reaction   Ciprofloxacin Other (See Comments)    Patient stated she "cannot take this"   Diclofenac Other (See Comments)    Reaction not recalled   Indomethacin Other (See Comments)    Reaction not recalled   Meloxicam Other (See Comments)    Reaction not recalled   Nortriptyline Other (See Comments)    Reaction not recalled    Voltaren [Diclofenac Sodium] Other (See Comments)    Reaction not recalled   Gabapentin Other (See Comments)    Makes the patient unable to sleep   Pamelor [Nortriptyline Hcl] Other (See Comments)    insomnia   Ropinirole Other (See Comments)    Speed up the restless legs   Sulfa Antibiotics Diarrhea and Nausea And Vomiting    Social History:   Social History   Socioeconomic History   Marital status: Married    Spouse name: Karlton Lemon   Number of children: 2   Years of education: Not on file   Highest education level: High school graduate  Occupational History   Occupation: retired  Tobacco Use   Smoking status: Never   Smokeless tobacco: Never  Vaping Use   Vaping Use: Never used  Substance and Sexual Activity   Alcohol use: No   Drug use: No   Sexual  activity: Not Currently  Other Topics Concern   Not on file  Social History Narrative   Working on a farm   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   She is married for 52 years and they have two children.   Social Determinants of Health   Financial Resource Strain: Not on file  Food Insecurity: No Food Insecurity (12/08/2021)   Hunger Vital Sign    Worried About Running Out of Food in the Last Year: Never true    Ran Out of Food in the Last Year: Never true  Transportation Needs: No Transportation Needs (12/08/2021)   PRAPARE - Hydrologist (Medical): No    Lack of Transportation (Non-Medical): No  Physical Activity: Not on file  Stress: Not on file  Social Connections: Not on file  Intimate Partner Violence: Not on file    Family History:   The patient's family history includes CAD (age of onset: 73) in her brother; Congestive Heart Failure in her mother; Diabetes in her brother and sister; Gout in her son; Heart failure in her mother; Hypertension in her mother; Pulmonary embolism in her father.    ROS:  Please see the history of present illness.  All other ROS reviewed and  negative.     Physical Exam/Data:   Vitals:   03/19/22 0635 03/19/22 0827 03/19/22 0852 03/19/22 0856  BP: (!) 141/69 137/65  134/74  Pulse: 77 84  86  Resp: 19 18  (!) 28  Temp: 98.8 F (37.1 C)   98.6 F (37 C)  TempSrc: Oral   Oral  SpO2: 95% 97%  92%  Weight:   76.2 kg   Height:   _0  (1.626 m)    No intake or output data in the 24 hours ending 03/19/22 0941    03/19/2022    8:52 AM 03/17/2022    8:42 AM 03/08/2022    2:04 PM  Last 3 Weights  Weight (lbs) 168 lb 175 lb 3.2 oz 168 lb  Weight (kg) 76.204 kg 79.47 kg 76.204 kg     Body mass index is 28.84 kg/m.  General:  Well nourished, well developed, in no acute distress HEENT: normal Neck: minimal JVD Vascular: No carotid bruits; Distal pulses 2+ bilaterally   Cardiac:  normal S1, loud holosystolic murmur Lungs:  crackles in bases R > L Abd: soft, nontender, no hepatomegaly  Ext: mild B LE edema Musculoskeletal:  No deformities, BUE and BLE strength normal and equal Skin: warm and dry  Neuro:  CNs 2-12 intact, no focal abnormalities noted Psych:  Normal affect    EKG:  The ECG that was done  was personally reviewed and demonstrates sinus rhythm with HR 84, PAC  Relevant CV Studies:  R/L heart cath 12/06/21: CONCLUSIONS: Irregularities from proximal to distal LAD with 40 to 50% proximal and [place] 0, 1, 1 bifurcation in the mid vessel with 0%, 50%, and 70% stenoses. Left main is widely patent Circumflex is codominant and widely patent Right coronary is codominant with luminal irregularities Mild pulmonary hypertension, WHO group 2 with mean PA pressure 32 mmHg. Peak to peak aortic valve gradient 21 mmHg and mean gradient 25 mmHg.  Calculated aortic valve area 1.1 cm, consistent with moderate mitral stenosis. Transmitral mean gradient 11 mmHg with calculated mitral valve area 1.64 cm Cardiac output by Fick 5.19 L/min and cardiac index 2.83 L/min/m. Mean pulmonary wedge pressure 18 mmHg, LVEDP 15 mmHg, and  pulmonary vascular  resistance 2.7 Wood units.   RECOMMENDATIONS: Compensated heart failure with patient able to lie flat.  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 Structural heart team evaluating the patient for possible valve procedures.   Echo 12/05/21: 1. Left ventricular ejection fraction, by estimation, is >75%. The left  ventricle has hyperdynamic function. The left ventricle has no regional  wall motion abnormalities. Left ventricular diastolic parameters are  consistent with Grade II diastolic  dysfunction (pseudonormalization).   2. Right ventricular systolic function is normal. The right ventricular  size is normal. There is severely elevated pulmonary artery systolic  pressure. The estimated right ventricular systolic pressure is 70.1 mmHg.   3. Left atrial size was moderately dilated.   4. Splay artifact. The mitral valve is degenerative. Moderate to severe  mitral valve regurgitation. Moderate mitral stenosis. The mean mitral  valve gradient is 8.0 mmHg. Severe mitral annular calcification.   5. Tricuspid valve regurgitation is moderate.   6. The aortic valve is calcified. There is moderate calcification of the  aortic valve. There is moderate thickening of the aortic valve. Aortic  valve regurgitation is mild. Moderate to severe aortic valve stenosis.  Aortic regurgitation PHT measures 426  msec. Aortic valve area, by VTI measures 0.82 cm. Aortic valve mean  gradient measures 33.2 mmHg. Aortic valve Vmax measures 3.96 m/s.   7. The inferior vena cava is normal in size with greater than 50%  respiratory variability, suggesting right atrial pressure of 3 mmHg.    Laboratory Data:  High Sensitivity Troponin:   Recent Labs  Lab 03/19/22 0645  TROPONINIHS 11      Chemistry Recent Labs  Lab 03/17/22 0930 03/19/22 0645  NA 139 137  K 3.9 3.8  CL 107 108  CO2 24 22  GLUCOSE 95 121*  BUN 19 12  CREATININE 0.80 0.75   CALCIUM 9.4 9.1  GFRNONAA >60 >60  ANIONGAP 8 7    Recent Labs  Lab 03/17/22 0930  PROT 6.7  ALBUMIN 3.8  AST 29  ALT 24  ALKPHOS 69  BILITOT 0.9   Lipids No results for input(s): "CHOL", "TRIG", "HDL", "LABVLDL", "LDLCALC", "CHOLHDL" in the last 168 hours. Hematology Recent Labs  Lab 03/17/22 0930 03/19/22 0645  WBC 8.2 8.9  RBC 3.69* 3.61*  HGB 12.2 11.7*  HCT 35.9* 35.5*  MCV 97.3 98.3  MCH 33.1 32.4  MCHC 34.0 33.0  RDW 13.2 13.1  PLT 206 203   Thyroid No results for input(s): "TSH", "FREET4" in the last 168 hours. BNPNo results for input(s): "BNP", "PROBNP" in the last 168 hours.  DDimer No results for input(s): "DDIMER" in the last 168 hours.   Radiology/Studies:  DG Chest 2 View  Result Date: 03/19/2022 CLINICAL DATA:  Short of breath and weakness. EXAM: CHEST - 2 VIEW COMPARISON:  03/17/2022 FINDINGS: Stable cardiomediastinal contours. Interval development of bilateral pleural effusions, left greater than right. Mild diffuse interstitial edema pattern is similar to previous exam. No airspace opacities. IMPRESSION: Progressive CHF pattern with new bilateral pleural effusions and persistent interstitial edema. Electronically Signed   By: Kerby Moors M.D.   On: 03/19/2022 07:11     Assessment and Plan:   Pulmonary edema Acute on chronic diastolic heart failure Severe aortic stenosis Severe MAC with moderate to severe MR Pt reports she was instructed to stop her lasix on 03/13/22. As such, she developed bilateral pleural effusions and pulmonary edema with orthopnea and PND. She has received 40  mg IV lasix.  - suspect she will need to be admitted for IV diuresis in anticipation of TAVR on Tues 03/21/22, as scheduled. Pt expresses desire to discharge home prior to TAVR.  - BNP 337   Nonobstructive CAD HS troponin x 2 negative EKG unchanged Continue crestor   Hypertension BP under good control today. Will avoid BB given CHF and AS.    Asymptomatic  UTI She is allergic to sulfa containing compounds. I have started nitrofurantoin 100 mg BID x 5 days.   Hx of hemorrhagic CVA Felt secondary to HTN Off ASA per neurology   Risk Assessment/Risk Scores:    New York Heart Association (NYHA) Functional Class NYHA Class IV     Severity of Illness: The appropriate patient status for this patient is INPATIENT. Inpatient status is judged to be reasonable and necessary in order to provide the required intensity of service to ensure the patient's safety. The patient's presenting symptoms, physical exam findings, and initial radiographic and laboratory data in the context of their chronic comorbidities is felt to place them at high risk for further clinical deterioration. Furthermore, it is not anticipated that the patient will be medically stable for discharge from the hospital within 2 midnights of admission.   * I certify that at the point of admission it is my clinical judgment that the patient will require inpatient hospital care spanning beyond 2 midnights from the point of admission due to high intensity of service, high risk for further deterioration and high frequency of surveillance required.*   For questions or updates, please contact [place] Please consult [email or web address] for contact info under     Signed, Ledora Bottcher, [place]  03/19/2022 9:41 AM    Attending Note:   The patient was seen and examined.  Agree with assessment and plan as noted above.  Changes made to the above note as needed.  Patient seen and independently examined with Doreene Adas, PA .   We discussed all aspects of the encounter. I agree with the assessment and plan as stated above.    Acute diastolic CHF:   due to stopping her lasix a week ago.   She is back to baseline after IV lasix .  She is breathing ok,  can lie flat without any difficulties.  She does not have any significant leg edema. She wants to go home.  She says she will feel better at home.   I do not have any acute reason to admit her at this point.  She will take another Lasix and potassium pill tonight. She will call our nurse coordinator tomorrow for further instructions.  We still should be able to do her TAVR on Tuesday.  2.  Urinary tract infection: She has been started on nitrofurantoin 100 mg twice daily for 5 days.    I have spent a total of 40 minutes with patient reviewing hospital  notes , telemetry, EKGs, labs and examining patient as well as establishing an assessment and plan that was discussed with the patient.  > 50% of time was spent in direct patient care.    Thayer Headings, Brooke Bonito., MD, [employer] 03/19/2022, 12:55 PM [address]  Media Pager [phone]

## 2022-03-19 NOTE — Discharge Instructions (Addendum)
Keep your upcoming cardiology appointments.  Continue Lasix at home.  Continue your antibiotic for treatment of UTI.  Return to the emergency department at any time if you have any return of concerning symptoms.

## 2022-03-19 NOTE — Progress Notes (Signed)
Right lower extremity venous duplex has been completed. Preliminary results can be found in CV Proc through chart review.  Results were given to Dr. Doren Custard.  03/19/22 11:10 AM Tiffany Washington RVT

## 2022-03-19 NOTE — ED Notes (Signed)
Provider at bedside

## 2022-03-20 ENCOUNTER — Telehealth: Payer: Self-pay

## 2022-03-20 MED ORDER — POTASSIUM CHLORIDE 2 MEQ/ML IV SOLN
80.0000 meq | INTRAVENOUS | Status: DC
  Filled 2022-03-20: qty 40

## 2022-03-20 MED ORDER — MAGNESIUM SULFATE 50 % IJ SOLN
40.0000 meq | INTRAMUSCULAR | Status: DC
  Filled 2022-03-20: qty 9.85

## 2022-03-20 MED ORDER — NOREPINEPHRINE 4 MG/250ML-% IV SOLN
0.0000 ug/min | INTRAVENOUS | Status: AC
  Administered 2022-03-21: 1 ug/min via INTRAVENOUS
  Filled 2022-03-20: qty 250

## 2022-03-20 MED ORDER — CEFAZOLIN SODIUM-DEXTROSE 2-4 GM/100ML-% IV SOLN
2.0000 g | INTRAVENOUS | Status: AC
  Administered 2022-03-21: 2 g via INTRAVENOUS
  Filled 2022-03-20: qty 100

## 2022-03-20 MED ORDER — DEXMEDETOMIDINE HCL IN NACL 400 MCG/100ML IV SOLN
0.1000 ug/kg/h | INTRAVENOUS | Status: AC
  Administered 2022-03-21: 76.2 ug via INTRAVENOUS
  Administered 2022-03-21: 1 ug/kg/h via INTRAVENOUS
  Filled 2022-03-20: qty 100

## 2022-03-20 MED ORDER — HEPARIN 30,000 UNITS/1000 ML (OHS) CELLSAVER SOLUTION
Status: DC
  Filled 2022-03-20: qty 1000

## 2022-03-20 NOTE — H&P (Signed)
[address]       [place] [address]             [phone]      Cardiothoracic Surgery Admission History and Physical   PCP is Velna Hatchet, MD Referring Provider is Lauree Chandler, MD Primary Cardiologist is Minus Breeding, MD   Reason for admission:  Severe aortic stenosis   HPI:   The patient is an 81 year old woman with a history of hypertension, intracranial hemorrhage in 12/2020 felt to be due to hypertension, arthritis, and aortic stenosis who was admitted to [facility] in May 2023 with acute congestive heart failure.  2D echocardiogram on 12/05/2021 showed a calcified and thickened aortic valve with restricted leaflet mobility.  The mean gradient was 33.2 mmHg and the peak gradient was 63 mmHg.  Valve area by VTI was 0.82 cm.  There was mild aortic insufficiency.  There was degenerative disease of the mitral valve with moderate thickening and calcification as well as severe mitral annular calcification.  There was moderate to severe mitral valve regurgitation with an eccentric medially directed jet.  There was moderate mitral stenosis with a mean mitral gradient of 8 mmHg and a peak gradient of 30 mmHg.  PA systolic pressure was severely elevated with normal right heart function.  Left ventricular ejection fraction was greater than 75% with grade 2 diastolic dysfunction.  She underwent cardiac catheterization at that time with diffuse disease in the LAD and no significant disease in the left circumflex or RCA.  The aortic valve had a peak to peak gradient of 21 mmHg and mean gradient of 25 mmHg.  The transmitral mean gradient was 11 mmHg.  She was treated with diuresis with improvement.   She lives with her husband.  She has not been doing very much activity but denies any chest pain or shortness of breath.  She has had no orthopnea or peripheral edema.  She denies dizziness and syncope.  She has woken up a few times in the middle night and had to sit straight  up to breathe and felt dizzy at that time.         Past Medical History:  Diagnosis Date   Aortic insufficiency     Aortic stenosis      Mild   Arthritis     Essential hypertension     GERD (gastroesophageal reflux disease)      mild   HOH (hard of hearing)     ICH (intracerebral hemorrhage) (HCC)     Melanoma in situ of left upper extremity (HCC)     Mild carotid artery disease (HCC)     Mitral regurgitation     Restless leg syndrome     Spondylolisthesis of lumbar region             Past Surgical History:  Procedure Laterality Date   BACK SURGERY   10/2019   CESAREAN SECTION       COLONOSCOPY       COLONOSCOPY W/ BIOPSIES AND POLYPECTOMY       KNEE SURGERY Bilateral      Bilateral   MULTIPLE TOOTH EXTRACTIONS       POLYPECTOMY       RIGHT/LEFT HEART CATH AND CORONARY ANGIOGRAPHY N/A 12/06/2021    Procedure: RIGHT/LEFT HEART CATH AND CORONARY ANGIOGRAPHY;  Surgeon: Belva Crome, MD;  Location: [facility];  Service: Cardiovascular;  Laterality: N/A;   UPPER GASTROINTESTINAL ENDOSCOPY  VAGINAL HYSTERECTOMY               Family History  Problem Relation Age of Onset   Hypertension Mother     Congestive Heart Failure Mother          Died, 45   Heart failure Mother     Pulmonary embolism Father          Died, 81   Diabetes Sister     CAD Brother 77        CABG   Diabetes Brother     Gout Son        Social History         Socioeconomic History   Marital status: Married      Spouse name: Karlton Lemon   Number of children: 2   Years of education: Not on file   Highest education level: High school graduate  Occupational History   Occupation: retired  Tobacco Use   Smoking status: Never   Smokeless tobacco: Never  Vaping Use   Vaping Use: Never used  Substance and Sexual Activity   Alcohol use: No   Drug use: No   Sexual activity: Not on file  Other Topics Concern   Not on file  Social History Narrative    Working on a farm    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    She is married for 52 years and they have two children.    Social Determinants of Health        Financial Resource Strain: Not on file  Food Insecurity: No Food Insecurity (12/08/2021)    Hunger Vital Sign     Worried About Running Out of Food in the Last Year: Never true     Ran Out of Food in the Last Year: Never true  Transportation Needs: No Transportation Needs (12/08/2021)    PRAPARE - Armed forces logistics/support/administrative officer (Medical): No     Lack of Transportation (Non-Medical): No  Physical Activity: Not on file  Stress: Not on file  Social Connections: Not on file  Intimate Partner Violence: Not on file             Prior to Admission medications   Medication Sig Start Date End Date Taking? Authorizing Provider  acetaminophen (TYLENOL) 325 MG tablet Take 650 mg by mouth every 6 (six) hours as needed for moderate pain or headache.     Yes [provider]  Ascorbic Acid (VITAMIN C) 1000 MG tablet Take 1,000 mg by mouth daily.     Yes [provider]  B Complex Vitamins (B COMPLEX 100 PO) Take 1 tablet by mouth daily.     Yes [provider]  furosemide (LASIX) 40 MG tablet Take 1 tablet (40 mg total) by mouth daily. 01/02/22   Yes Eileen Stanford, PA-C  Multiple Vitamins-Minerals (MULTIVITAMIN WITH MINERALS) tablet Take 1 tablet by mouth daily.      Yes [provider]  potassium chloride SA (KLOR-CON M) 20 MEQ tablet Take 1 tablet (20 mEq total) by mouth daily. 01/02/22   Yes Eileen Stanford, PA-C  pramipexole (MIRAPEX) 0.25 MG tablet Take 0.25 mg by mouth 2 (two) times daily.      Yes [provider]  rosuvastatin (CRESTOR) 20 MG tablet Take 1 tablet (20 mg total) by mouth daily. 01/07/21   Yes Einar Pheasant, NP  Zinc 30 MG CAPS Take 30 mg by mouth daily.  Yes [provider]            Current Outpatient Medications  Medication Sig Dispense Refill   acetaminophen  (TYLENOL) 325 MG tablet Take 650 mg by mouth every 6 (six) hours as needed for moderate pain or headache.       Ascorbic Acid (VITAMIN C) 1000 MG tablet Take 1,000 mg by mouth daily.       B Complex Vitamins (B COMPLEX 100 PO) Take 1 tablet by mouth daily.       furosemide (LASIX) 40 MG tablet Take 1 tablet (40 mg total) by mouth daily. 90 tablet 3   Multiple Vitamins-Minerals (MULTIVITAMIN WITH MINERALS) tablet Take 1 tablet by mouth daily.        potassium chloride SA (KLOR-CON M) 20 MEQ tablet Take 1 tablet (20 mEq total) by mouth daily. 90 tablet 3   pramipexole (MIRAPEX) 0.25 MG tablet Take 0.25 mg by mouth 2 (two) times daily.        rosuvastatin (CRESTOR) 20 MG tablet Take 1 tablet (20 mg total) by mouth daily. 30 tablet 3   Zinc 30 MG CAPS Take 30 mg by mouth daily.        No current facility-administered medications for this visit.           Allergies  Allergen Reactions   Carbamazepine Other (See Comments)      Doesn't remember reaction   Ciprofloxacin Other (See Comments)      Patient stated she "cannot take this"   Diclofenac Other (See Comments)      Reaction not recalled   Indomethacin Other (See Comments)      Reaction not recalled   Meloxicam Other (See Comments)      Reaction not recalled   Nortriptyline Other (See Comments)      Reaction not recalled   Voltaren [Diclofenac Sodium] Other (See Comments)      Reaction not recalled   Gabapentin Other (See Comments)      Makes the patient unable to sleep   Pamelor [Nortriptyline Hcl] Other (See Comments)      insomnia   Ropinirole Other (See Comments)      Speed up the restless legs   Sulfa Antibiotics Diarrhea and Nausea And Vomiting          Review of Systems:               General:                      normal appetite, + decreased energy, no weight gain, no weight loss, no fever             Cardiac:                       no chest pain with exertion, no chest pain at rest, +SOB with moderate exertion, no  resting SOB, + PND, no orthopnea, no palpitations, no arrhythmia, no atrial fibrillation, no LE edema, + dizzy spells, no syncope             Respiratory:                 + exertional shortness of breath, no home oxygen, no productive cough, no dry cough, no bronchitis, no wheezing, no hemoptysis, no asthma, no pain with inspiration or cough, no sleep apnea, no CPAP at night             GI:  no difficulty swallowing, no reflux, no frequent heartburn, no hiatal hernia, no abdominal pain, no constipation, no diarrhea, no hematochezia, no hematemesis, no melena             GU:                              no dysuria,  no frequency, no urinary tract infection, no hematuria, no kidney stones, no kidney disease             Vascular:                     no pain suggestive of claudication, no pain in feet, no leg cramps, no varicose veins, no DVT, no non-healing foot ulcer             Neuro:                         no stroke, no TIA's, no seizures, no headaches, no temporary blindness one eye,  no slurred speech, no peripheral neuropathy, no chronic pain, + instability of gait, no memory/cognitive dysfunction             Musculoskeletal:         + arthritis - primarily involving the knees, no joint swelling, no myalgias, + difficulty walking, + decreased mobility              Skin:                            no rash, no itching, no skin infections, no pressure sores or ulcerations             Psych:                         no anxiety, no depression, no nervousness, no unusual recent stress             Eyes:                           no blurry vision, no floaters, no recent vision changes, + wears glasses              ENT:                            no hearing loss, no loose or painful teeth, no dentures, sees dentist regularly             Hematologic:               no easy bruising, no abnormal bleeding, no clotting disorder, no frequent epistaxis             Endocrine:                    no diabetes, does not check CBG's at home                            Physical Exam:               BP 137/75   Pulse 90   Resp 20   Ht '5\' 4"'$  (1.626 m)   Wt 168 lb (76.2 kg)   SpO2 97% Comment: RA  BMI 28.84 kg/m              General:                      well-appearing             HEENT:                       Unremarkable, NCAT, PERLA, EOMI             Neck:                           no JVD, no bruits, no adenopathy              Chest:                          clear to auscultation, symmetrical breath sounds, no wheezes, no rhonchi              CV:                              RRR, 3/6 systolic murmur RSB, no diastolic murmur             Abdomen:                    soft, non-tender, no masses              Extremities:                 warm, well-perfused, pulses palpable bilaterally, no lower extremity edema             Rectal/GU                   Deferred             Neuro:                         Grossly non-focal and symmetrical throughout             Skin:                            Clean and dry, no rashes, no breakdown   Diagnostic Tests:      ECHOCARDIOGRAM REPORT         Patient Name:   LOUIE MEADERS Date of Exam: 12/05/2021  Medical Rec #:  [id number]        Height:       64.0 in  Accession #:    [id number]       Weight:       174.1 lb  Date of Birth:  02/04/1941         BSA:          1.844 m  Patient Age:    44 years         BP:           108/67 mmHg  Patient Gender: F                HR:           83 bpm.  Exam Location:  Inpatient   Procedure: 2D Echo, Cardiac Doppler and Color Doppler   Indications:  CHF- Acute systolic     History:        Patient has prior history of Echocardiogram examinations,  most                  recent 05/03/2021. CHF, CAD; Risk Factors:Hypertension.     Sonographer:    Jefferey Pica  Referring Phys: [id number] Nenzel     1. Left ventricular ejection fraction, by estimation, is >75%. The left  ventricle has  hyperdynamic function. The left ventricle has no regional  wall motion abnormalities. Left ventricular diastolic parameters are  consistent with Grade II diastolic  dysfunction (pseudonormalization).   2. Right ventricular systolic function is normal. The right ventricular  size is normal. There is severely elevated pulmonary artery systolic  pressure. The estimated right ventricular systolic pressure is 76.1 mmHg.   3. Left atrial size was moderately dilated.   4. Splay artifact. The mitral valve is degenerative. Moderate to severe  mitral valve regurgitation. Moderate mitral stenosis. The mean mitral  valve gradient is 8.0 mmHg. Severe mitral annular calcification.   5. Tricuspid valve regurgitation is moderate.   6. The aortic valve is calcified. There is moderate calcification of the  aortic valve. There is moderate thickening of the aortic valve. Aortic  valve regurgitation is mild. Moderate to severe aortic valve stenosis.  Aortic regurgitation PHT measures 426  msec. Aortic valve area, by VTI measures 0.82 cm. Aortic valve mean  gradient measures 33.2 mmHg. Aortic valve Vmax measures 3.96 m/s.   7. The inferior vena cava is normal in size with greater than 50%  respiratory variability, suggesting right atrial pressure of 3 mmHg.   Comparison(s): Aortic stenosis and mitral stenosis have increased.   FINDINGS   Left Ventricle: Left ventricular ejection fraction, by estimation, is  >75%. The left ventricle has hyperdynamic function. The left ventricle has  no regional wall motion abnormalities. The left ventricular internal  cavity size was normal in size. There  is no left ventricular hypertrophy. Left ventricular diastolic parameters  are consistent with Grade II diastolic dysfunction (pseudonormalization).   Right Ventricle: The right ventricular size is normal. No increase in  right ventricular wall thickness. Right ventricular systolic function is  normal. There is severely  elevated pulmonary artery systolic pressure. The  tricuspid regurgitant velocity is  4.02 m/s, and with an assumed right atrial pressure of 10 mmHg, the  estimated right ventricular systolic pressure is 60.7 mmHg.   Left Atrium: Left atrial size was moderately dilated.   Right Atrium: Right atrial size was normal in size.   Pericardium: There is no evidence of pericardial effusion.   Mitral Valve: Splay artifact. The mitral valve is degenerative in  appearance. There is moderate thickening of the mitral valve leaflet(s).  There is moderate calcification of the mitral valve leaflet(s). Severe  mitral annular calcification. Moderate to  severe mitral valve regurgitation, with eccentric medially directed jet.  Moderate mitral valve stenosis. MV peak gradient, 30.2 mmHg. The mean  mitral valve gradient is 8.0 mmHg.   Tricuspid Valve: The tricuspid valve is normal in structure. Tricuspid  valve regurgitation is moderate . No evidence of tricuspid stenosis.   Aortic Valve: The aortic valve is calcified. There is moderate  calcification of the aortic valve. There is moderate thickening of the  aortic valve. Aortic valve regurgitation is mild. Aortic regurgitation PHT  measures 426 msec. Moderate to severe aortic  stenosis is present. Aortic valve mean gradient measures 33.2  mmHg. Aortic  valve peak gradient measures 62.7 mmHg. Aortic valve area, by VTI measures  0.82 cm.   Pulmonic Valve: The pulmonic valve was normal in structure. Pulmonic valve  regurgitation is trivial. No evidence of pulmonic stenosis.   Aorta: The aortic root is normal in size and structure.   Venous: The inferior vena cava is normal in size with greater than 50%  respiratory variability, suggesting right atrial pressure of 3 mmHg.   IAS/Shunts: No atrial level shunt detected by color flow Doppler.      LEFT VENTRICLE  PLAX 2D  LVIDd:         4.30 cm   Diastology  LVIDs:         1.50 cm   LV e' medial:     3.71 cm/s  LV PW:         1.10 cm   LV E/e' medial:  45.8  LV IVS:        1.10 cm   LV e' lateral:   6.70 cm/s  LVOT diam:     1.80 cm   LV E/e' lateral: 25.4  LV SV:         62  LV SV Index:   34  LVOT Area:     2.54 cm      RIGHT VENTRICLE             IVC  RV Basal diam:  2.80 cm     IVC diam: 2.10 cm  RV S prime:     14.00 cm/s  TAPSE (M-mode): 2.2 cm   LEFT ATRIUM             Index        RIGHT ATRIUM           Index  LA diam:        4.60 cm 2.49 cm/m   RA Area:     14.00 cm  LA Vol (A2C):   73.7 ml 39.96 ml/m  RA Volume:   32.70 ml  17.73 ml/m  LA Vol (A4C):   72.8 ml 39.47 ml/m  LA Biplane Vol: 76.7 ml 41.59 ml/m   AORTIC VALVE                     PULMONIC VALVE  AV Area (Vmax):    0.77 cm      PV Vmax:       1.05 m/s  AV Area (Vmean):   0.77 cm      PV Peak grad:  4.4 mmHg  AV Area (VTI):     0.82 cm  AV Vmax:           396.00 cm/s  AV Vmean:          262.200 cm/s  AV VTI:            0.758 m  AV Peak Grad:      62.7 mmHg  AV Mean Grad:      33.2 mmHg  LVOT Vmax:         120.00 cm/s  LVOT Vmean:        79.500 cm/s  LVOT VTI:          0.245 m  LVOT/AV VTI ratio: 0.32  AI PHT:            426 msec     AORTA  Ao Root diam: 3.10 cm  Ao Asc diam:  3.80 cm   MITRAL VALVE  TRICUSPID VALVE  MV Area (PHT): 2.97 cm     TR Peak grad:   64.6 mmHg  MV Area VTI:   0.99 cm     TR Vmax:        402.00 cm/s  MV Peak grad:  30.2 mmHg  MV Mean grad:  8.0 mmHg     SHUNTS  MV Vmax:       2.75 m/s     Systemic VTI:  0.24 m  MV Vmean:      126.5 cm/s   Systemic Diam: 1.80 cm  MV Decel Time: 255 msec  MV E velocity: 170.00 cm/s  MV A velocity: 127.00 cm/s  MV E/A ratio:  1.34   Candee Furbish MD  Electronically signed by Candee Furbish MD  Signature Date/Time: 12/05/2021/12:51:18 PM         Final       Physicians   Panel Physicians Referring Physician Case Authorizing Physician  Belva Crome, MD (Primary)        Procedures   RIGHT/LEFT HEART CATH AND  CORONARY ANGIOGRAPHY    Conclusion   CONCLUSIONS: Irregularities from proximal to distal LAD with 40 to 50% proximal and [place] 0, 1, 1 bifurcation in the mid vessel with 0%, 50%, and 70% stenoses. Left main is widely patent Circumflex is codominant and widely patent Right coronary is codominant with luminal irregularities Mild pulmonary hypertension, WHO group 2 with mean PA pressure 32 mmHg. Peak to peak aortic valve gradient 21 mmHg and mean gradient 25 mmHg.  Calculated aortic valve area 1.1 cm, consistent with moderate mitral stenosis. Transmitral mean gradient 11 mmHg with calculated mitral valve area 1.64 cm Cardiac output by Fick 5.19 L/min and cardiac index 2.83 L/min/m. Mean pulmonary wedge pressure 18 mmHg, LVEDP 15 mmHg, and pulmonary vascular resistance 2.7 Wood units.   RECOMMENDATIONS: Compensated heart failure with patient able to lie flat.  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 Structural heart team evaluating the patient for possible valve procedures.     Surgeon Notes       12/06/2021  6:28 PM CV Procedure signed by Belva Crome, MD    Procedural Details   Technical Details The right radial area was sterilely prepped and draped. Intravenous sedation with Versed and fentanyl was administered. 1% Xylocaine was infiltrated to achieve local analgesia. Using real-time vascular ultrasound, a double wall stick with an angiocath was utilized to obtain intra-arterial access. A VUS image was saved for the permanent record.The modified Seldinger technique was used to place a 42F " Slender" sheath in the right radial artery. Weight based heparin was administered. Coronary angiography was done using 5 F catheters. Right coronary angiography was performed with a JR4. Left ventricular hemodymic recordings were done using the JR 4 catheter after reentering the LV using a 0.035 straight soft wire.  Ventriculography was not performed.  Left  coronary angiography was performed with a JL 3.5 cm.  Right heart catheterization was performed by exchanging a previously placed antecubital IV angio-cath for a 5 French Slender sheath. 1% Xylocaine was used to locally nesthetize the area around the IV site. The IV catheter was wired using an .018 guidewire. The modified Seldinger technique was used to place the 5 [place] sheath. Double glove technique was used to enhance sterility. After sheath insertion, right heart cath was performed using a 5 French balloon tipped catheter and fluoroscopic guidance. Pressures were recorded in each chamber and in the pulmonary  capillary wedge position.. The main pulmonary artery O2 saturation was sampled.   Hemostasis was achieved using a pneumatic band.  During this procedure the patient is administered a total of Versed 0.5 mg and Fentanyl 25 mcg to achieve and maintain moderate conscious sedation.  The patient's heart rate, blood pressure, and oxygen saturation are monitored continuously during the procedure. The period of conscious sedation is 29 minutes, of which I was present face-to-face 100% of this time.  Estimated blood loss <50 mL.   During this procedure medications were administered to achieve and maintain moderate conscious sedation while the patient's heart rate, blood pressure, and oxygen saturation were continuously monitored and I was present face-to-face 100% of this time.    Medications (Filter: Administrations occurring from 1700 to 1818 on 12/06/21)  important  Continuous medications are totaled by the amount administered until 12/06/21 1818.    fentaNYL (SUBLIMAZE) injection (mcg) Total dose:  25 mcg  Date/Time Rate/Dose/Volume Action    12/06/21 1727 25 mcg Given      midazolam (VERSED) injection (mg) Total dose:  0.5 mg  Date/Time Rate/Dose/Volume Action    12/06/21 1727 0.5 mg Given      lidocaine (PF) (XYLOCAINE) 1 % injection (mL) Total volume:  2 mL  Date/Time  Rate/Dose/Volume Action    12/06/21 1728 2 mL Given      heparin sodium (porcine) injection (Units) Total dose:  4,000 Units  Date/Time Rate/Dose/Volume Action    12/06/21 1744 4,000 Units Given      iohexol (OMNIPAQUE) 350 MG/ML injection (mL) Total volume:  45 mL  Date/Time Rate/Dose/Volume Action    12/06/21 1801 45 mL Given      Heparin (Porcine) in NaCl 1000-0.9 UT/500ML-% SOLN (mL) Total volume:  1,000 mL  Date/Time Rate/Dose/Volume Action    12/06/21 1801 500 mL Given    1801 500 mL Given      Radial Cocktail/Verapamil only (mL) Total volume:  10 mL  Date/Time Rate/Dose/Volume Action    12/06/21 1732 10 mL Given      Sedation Time   Sedation Time Physician-1: 28 minutes 38 seconds Contrast   Medication Name Total Dose  iohexol (OMNIPAQUE) 350 MG/ML injection 45 mL    Radiation/Fluoro   Fluoro time: 7 (min) DAP: 13150 (mGycm2) Cumulative Air Kerma: 101 (mGy) Complications      Complications documented before study signed (12/06/2021  7:51 PM)     No complications were associated with this study.  Documented by Elveria Royals, RT - 12/06/2021  6:04 PM      Coronary Findings   Diagnostic Dominance: Co-dominant Left Anterior Descending  Vessel is moderate in size. There is mild diffuse disease throughout the vessel.  Mid LAD lesion is 50% stenosed.    First Diagonal Branch  Vessel is small in size.    Second Diagonal Branch  2nd Diag lesion is 70% stenosed.    Left Circumflex  There is mild diffuse disease throughout the vessel.    First Obtuse Marginal Branch  Vessel is small in size.    First Left Posterolateral Branch  Vessel is large in size.    Second Left Posterolateral Branch  Vessel is small in size.    Right Coronary Artery  The vessel exhibits minimal luminal irregularities.    Intervention    No interventions have been documented.    Right Heart   Right Heart Pressures Hemodynamic findings consistent with mild pulmonary  hypertension and mitral valve stenosis. LV EDP is normal.  Left Heart   Left Ventricle LV end diastolic pressure is normal.  Mitral Valve There is moderate mitral valve stenosis. The annulus is calcified.  Aortic Valve There is moderate aortic valve stenosis. The aortic valve is calcified. There is restricted aortic valve motion.    Coronary Diagrams   Diagnostic Dominance: Co-dominant  Intervention   Implants      No implant documentation for this case.    Syngo Images    Show images for CARDIAC CATHETERIZATION Images on Long Term Storage    Show images for Yeley, SHELDON AMARA to Procedure Log   Procedure Log    Hemo Data   Flowsheet Row Most Recent Value  Fick Cardiac Output 5.19 L/min  Fick Cardiac Output Index 2.83 (L/min)/BSA  Aortic Mean Gradient 24.7 mmHg  Aortic Peak Gradient 21.1 mmHg  Aortic Valve Area 1.10  Aortic Value Area Index 0.6 cm2/BSA  Mitral Mean Gradient 13.76 mmHg  Mitral Peak Gradient 7.7 mmHg  Mitral Valve Area Index 0.85 cm2/BSA  RA A Wave 8 mmHg  RA V Wave 4 mmHg  RA Mean 4 mmHg  RV Systolic Pressure 43 mmHg  RV Diastolic Pressure 1 mmHg  RV EDP 9 mmHg  PA Systolic Pressure 44 mmHg  PA Diastolic Pressure 17 mmHg  PA Mean 32 mmHg  PW A Wave 22 mmHg  PW V Wave 28 mmHg  PW Mean 18 mmHg  AO Systolic Pressure 161 mmHg  AO Diastolic Pressure 63 mmHg  AO Mean 89 mmHg  LV Systolic Pressure 096 mmHg  LV Diastolic Pressure 2 mmHg  LV EDP 11 mmHg  AOp Systolic Pressure 045 mmHg  AOp Diastolic Pressure 52 mmHg  AOp Mean Pressure 85 mmHg  LVp Systolic Pressure 409 mmHg  LVp Diastolic Pressure 1 mmHg  LVp EDP Pressure 15 mmHg  QP/QS 1  TPVR Index 11.29 HRUI  TSVR Index 31.4 HRUI  PVR SVR Ratio 0.16  TPVR/TSVR Ratio 0.36    Addendum   ADDENDUM REPORT: 12/07/2021 19:21   CLINICAL DATA:  69 -year-old female with severe aortic stenosis being evaluated for a TAVR procedure.   EXAM: Cardiac TAVR CT   TECHNIQUE: The patient  was scanned on a [employer]. A 120 kV retrospective scan was triggered in the descending thoracic aorta at 111 HU's. Gantry rotation speed was 250 msecs and collimation was .6 mm. No beta blockade or nitro were given. The 3D data set was reconstructed in 5% intervals of the R-R cycle. Systolic and diastolic phases were analyzed on a dedicated work station using MPR, MIP and VRT modes. The patient received 80 cc of contrast.   FINDINGS: Aortic Root:   Aortic valve: Trileaflet   Aortic valve calcium score: 1165   Aortic annulus:   Diameter: 60m x 159m  Perimeter: 6478m Area: 305 mm^2   Calcifications: No calcifications   Coronary height: Min Left - 68m52max Left - 18mm92mn Right - 12mm 51mnotubular height: Left cusp - 21mm; 6mt cusp - 17mm; N8mronary cusp - 21mm   L35m(as measured 3 mm below the annulus):   Diameter: 25mm x 1631m Are58m03 mm^2   Calcifications: No calcifications   Aortic sinus width: Left cusp - 30mm; Right13mp - 27mm; Noncor81my cusp - 31mm   Sinotu64mr junction width: 28mm x 25mm   3mmum 32mroscopic Angle for Delivery: LAO 3 CAU 6   Cardiac:   Right atrium: Mild enlargement   Right ventricle: Mild  enlargement   Pulmonary arteries: Normal size   Pulmonary veins: Normal configuration   Left atrium: Mild enlargement   Left ventricle: Normal size   Pericardium: Normal thickness   Mitral valve: Severe mitral annular calcification   Coronary arteries: Calcium score 1657 (95th percentile)   IMPRESSION: 1. Trileaflet aortic valve with moderate calcifications (AV calcium score 1165)   2. Small aortic annulus measuring 34m x 131min diameter with perimeter 6442mnd area 305 mm^2. No annular or LVOT calcification. By annular area, would be appropriate for placement of 59m75molut valve, though by perimeter would size to a 26mm30mve. Sinus of valsalva diameter would be borderline for 26mm 92me however  (measures 27mm a73mght cusp), but would be appropriate for 59mm va79m Would discuss at structural heart conference   3. Sufficient coronary to annulus distance, measuring 12mm to 29mand 15mm to l45mmain   4.  Optimum Fluoroscopic Angle for Delivery:  LAO 3 CAU 6   5.  Coronary calcium score 1657 (95th percentile)     Electronically Signed   By: ChristopheOswaldo Milian: 12/07/2021 19:21    Addended by Schumann, Donato Heinz10/2023  7:23 PM    Study Result   Narrative & Impression  EXAM: OVER-READ INTERPRETATION  CT CHEST   The following report is an over-read performed by radiologist [facility]. This over-read does not include interpretation of cardiac or coronary anatomy or pathology. The coronary calcium score/coronary CTA interpretation by the cardiologist is attached.   COMPARISON:  None Available.   FINDINGS: Extracardiac findings will be described separately under dictation for contemporaneously obtained CTA chest, abdomen and pelvis.   IMPRESSION: Please see separate dictation for contemporaneously obtained CTA chest, abdomen and pelvis dated 12/07/2021 for full description of relevant extracardiac findings.   Electronically Signed: By: Leah  StriYetta Glassman05/04/2022 12:49          Narrative & Impression  CLINICAL DATA:  Preop evaluation for aortic valve replacement   EXAM: CT ANGIOGRAPHY CHEST, ABDOMEN AND PELVIS   TECHNIQUE: Non-contrast CT of the chest was initially obtained.   Multidetector CT imaging through the chest, abdomen and pelvis was performed using the standard protocol during bolus administration of intravenous contrast. Multiplanar reconstructed images and MIPs were obtained and reviewed to evaluate the vascular anatomy.   RADIATION DOSE REDUCTION: This exam was performed according to the departmental dose-optimization program which includes  automated exposure control, adjustment of the mA and/or kV according to patient size and/or use of iterative reconstruction technique.   CONTRAST:  100mL OMNIP38m IOHEXOL 350 MG/ML SOLN   COMPARISON:  CT abdomen and pelvis dated February 08, 2018   FINDINGS: CTA CHEST FINDINGS   Cardiovascular: Normal heart size. Pericardial effusion. Left main and three-vessel coronary artery calcifications. Aortic valve thickening and calcifications. Moderate atherosclerotic disease of the thoracic aorta. Standard three-vessel aortic arch with no significant stenosis. No suspicious filling defects of the central pulmonary arteries. Mitral annular calcifications.   Mediastinum/Nodes: Thyroid is unremarkable. Small hiatal hernia. Mildly enlarged mediastinal and hilar lymph nodes, likely reactive. Reference right lower paratracheal lymph node measuring 1.2 cm in short axis on series 4, image 52.   Lungs/Pleura: Central airways are patent. Bilateral central and upper lobe predominant ground-glass opacities with associated interlobular septal thickening. Small right-greater-than-left pleural effusions and bibasilar atelectasis.   Musculoskeletal: New mild age indeterminate compression deformity at T10.   CTA ABDOMEN AND  PELVIS FINDINGS   Hepatobiliary: No focal liver abnormality is seen. No gallstones, gallbladder wall thickening, or biliary dilatation.   Pancreas: Unremarkable. No pancreatic ductal dilatation or surrounding inflammatory changes.   Spleen: Normal in size without focal abnormality.   Adrenals/Urinary Tract: Bilateral adrenal glands are unremarkable. No hydronephrosis or nephrolithiasis. Simple appearing cyst of the upper pole of the left kidney, unchanged compared to prior exam.   Stomach/Bowel: Stomach is within normal limits. Appendix appears normal. No evidence of bowel wall thickening, distention, or inflammatory changes.   Vascular/lymphatic: Severe atherosclerotic  disease of the abdominal aorta. Normal caliber abdominal aorta. Severe narrowing at the origin of the celiac artery with poststenotic dilatation, due to a combination of calcified and noncalcified plaque. Moderate narrowing at the origin of the SMA due to calcified plaque. Mild narrowing at the origin of the right renal artery due to calcified plaque, left renal arteries widely patent. IMA is patent. No pathologically enlarged lymph nodes seen in the abdomen or pelvis.   Reproductive: No adnexal masses.   Other: No abdominal wall hernia or abnormality. No abdominopelvic ascites.   Musculoskeletal: Interval posterior fusion of L4-L5 similar mild L2 compression deformity new mild age indeterminate T12 and L3 compression deformities.   VASCULAR MEASUREMENTS PERTINENT TO TAVR:   AORTA:   Minimal Aortic Diameter -  13.3 mm   Severity of Aortic Calcification-severe   RIGHT PELVIS:   Right Common Iliac Artery -   Minimal Diameter-9.3 mm   Tortuosity-none   Calcification-severe   Right External Iliac Artery -   Minimal Diameter-7.2 mm   Tortuosity-mild   Calcification-none   Right Common Femoral Artery -   Minimal Diameter-5.8 mm   Tortuosity-none   Calcification-moderate   LEFT PELVIS:   Left Common Iliac Artery -   Minimal Diameter-9.7 mm   Tortuosity-none   Calcification-mild   Left External Iliac Artery -   Minimal Diameter-7.6 mm   Tortuosity-moderate   Calcification-none   Left Common Femoral Artery -   Minimal Diameter-6.4 mm   Tortuosity-none   Calcification-mild   Review of the MIP images confirms the above findings.   IMPRESSION: 1. Vascular findings and measurements pertinent to potential TAVR procedure, as detailed above. 2. Severe thickening and calcification of the aortic valve, compatible with reported clinical history of severe aortic stenosis. 3. 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 Left main and 3 vessel  coronary artery disease. 4. Moderate pulmonary edema and small right-greater-than-left. 5. New mild indeterminate compression deformities at T10, T12, and L3. Correlate for point tenderness.     Electronically Signed   By: Yetta Glassman M.D.   On: 12/07/2021 17:09        Impression:   This 81 year old woman has stage D, severe, symptomatic aortic stenosis with NYHA class II symptoms of exertional fatigue and shortness of breath consistent with chronic diastolic congestive heart failure.  She was admitted in May 2023 with acute on chronic diastolic heart failure responding to diuresis.  I have personally reviewed her 2D echocardiogram, cardiac catheterization, and CTA studies.  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  The mean gradient is 33.2 mmHg with a valve area of 0.77 cm.  There is also moderate to severe mitral regurgitation and moderate mitral stenosis which may be contributing to her symptoms.  Cardiac catheterization shows nonobstructive coronary disease.  The peak to peak aortic valve gradient was 25 mmHg with a mean transmitral gradient  of 11 mmHg.  I agree that aortic valve replacement is indicated in this patient for relief of her symptoms and to prevent left ventricular dysfunction.  She does have significant mitral valve disease but I do not think she is a candidate for open surgical aortic and mitral valve replacements due to the combination of her age and severe arthritis with limited mobility as well as the severity of her mitral annular calcification.  I think that transcatheter aortic valve replacement would be the best option for treating her.  Her gated cardiac CTA shows anatomy suitable for TAVR although she has a relatively small annulus.  Her anatomy appears suitable for a 26 mm Medtronic Evolut valve.  Her abdominal and pelvic CTA shows adequate pelvic vascular anatomy to allow  transfemoral insertion.   The patient and her husband were counseled at length regarding treatment alternatives for management of severe symptomatic aortic stenosis. The risks and benefits of surgical intervention has been discussed in detail. Long-term prognosis with medical therapy was discussed. Alternative approaches such as conventional surgical aortic valve replacement, transcatheter aortic valve replacement, and palliative medical therapy were compared and contrasted at length. This discussion was placed in the context of the patient's own specific clinical presentation and past medical history. All of their questions have been addressed.    Following the decision to proceed with transcatheter aortic valve replacement, a discussion was held regarding what types of management strategies would be attempted intraoperatively in the event of life-threatening complications, including whether or not the patient would be considered a candidate for the use of cardiopulmonary bypass and/or conversion to open sternotomy for attempted surgical intervention.  I do not think she is a candidate for emergent sternotomy to manage any intraoperative complications given the combination of her age, restricted mobility, and moderate to severe mitral valve regurgitation and stenosis with a severely calcified mitral annulus.  The patient is aware of the fact that transient use of cardiopulmonary bypass may be necessary. The patient has been advised of a variety of complications that might develop including but not limited to risks of death, stroke, paravalvular leak, aortic dissection or other major vascular complications, aortic annulus rupture, device embolization, cardiac rupture or perforation, mitral regurgitation, acute myocardial infarction, arrhythmia, heart block or bradycardia requiring permanent pacemaker placement, congestive heart failure, respiratory failure, renal failure, pneumonia, infection, other late  complications related to structural valve deterioration or migration, or other complications that might ultimately cause a temporary or permanent loss of functional independence or other long term morbidity. The patient provides full informed consent for the procedure as described and all questions were answered.           Plan:   Transfemoral TAVR using a Medtronic valve.       Gaye Pollack, MD

## 2022-03-20 NOTE — Telephone Encounter (Signed)
The pt left a voicemail on my office phone 03/18/22 in regards to medication questions.  I advised the pt that the structural heart team is not in the office over the weekends and that she can contact [facility] over the weekend with any questions or concerns.    The pt did come into the ER on Sunday due to SOB after stopping her lasix last week.  I advised the pt that this was not the instruction that I reviewed with her over the phone or in the letter that I sent to her, which she confirmed that she reviewed and had no further questions.    Per my 8/15 letter: STOP now taking any Aleve, Naproxen, Ibuprofen, Motrin, Advil, Goody's, BC's, all herbal medications, fish oil, and all non-prescription vitamins.   Continue taking all other medications including Aspirin without change through the day before surgery. On the morning of surgery do not take any medications.   Today the pt is feeling better and I once again reviewed what medications that she needs to take today (ASA, Lasix, Potassium, Mirapex, Crestor and Macrobid) and no medications Tuesday morning. The pt verbalized understanding.

## 2022-03-21 ENCOUNTER — Other Ambulatory Visit (HOSPITAL_COMMUNITY): Payer: Self-pay | Admitting: *Deleted

## 2022-03-21 ENCOUNTER — Encounter (HOSPITAL_COMMUNITY)
Admission: RE | Disposition: A | Discharge status: Home / Self Care | Payer: Self-pay | Source: Home / Self Care | Attending: Surgery

## 2022-03-21 ENCOUNTER — Inpatient Hospital Stay (HOSPITAL_COMMUNITY): Payer: PPO | Admitting: Anesthesiology

## 2022-03-21 ENCOUNTER — Inpatient Hospital Stay (HOSPITAL_COMMUNITY)
Admission: RE | Admit: 2022-03-21 | Discharge: 2022-03-22 | DRG: 267 | Disposition: A | Discharge status: Home / Self Care | Payer: PPO | Attending: Surgery | Admitting: Surgery

## 2022-03-21 ENCOUNTER — Other Ambulatory Visit: Payer: Self-pay | Admitting: Physician Assistant

## 2022-03-21 ENCOUNTER — Encounter (HOSPITAL_COMMUNITY): Payer: Self-pay | Admitting: Cardiovascular Disease

## 2022-03-21 ENCOUNTER — Inpatient Hospital Stay (HOSPITAL_COMMUNITY): Payer: PPO | Admitting: Physician Assistant

## 2022-03-21 ENCOUNTER — Inpatient Hospital Stay (HOSPITAL_COMMUNITY): Payer: PPO

## 2022-03-21 DIAGNOSIS — I350 Nonrheumatic aortic (valve) stenosis: Principal | ICD-10-CM | POA: Diagnosis present

## 2022-03-21 DIAGNOSIS — Z882 Allergy status to sulfonamides status: Secondary | ICD-10-CM

## 2022-03-21 DIAGNOSIS — I2510 Atherosclerotic heart disease of native coronary artery without angina pectoris: Secondary | ICD-10-CM | POA: Diagnosis not present

## 2022-03-21 DIAGNOSIS — I110 Hypertensive heart disease with heart failure: Secondary | ICD-10-CM | POA: Diagnosis present

## 2022-03-21 DIAGNOSIS — G2581 Restless legs syndrome: Secondary | ICD-10-CM | POA: Diagnosis not present

## 2022-03-21 DIAGNOSIS — I2722 Pulmonary hypertension due to left heart disease: Secondary | ICD-10-CM | POA: Diagnosis present

## 2022-03-21 DIAGNOSIS — Z85820 Personal history of malignant melanoma of skin: Secondary | ICD-10-CM

## 2022-03-21 DIAGNOSIS — Z952 Presence of prosthetic heart valve: Principal | ICD-10-CM

## 2022-03-21 DIAGNOSIS — Z79899 Other long term (current) drug therapy: Secondary | ICD-10-CM | POA: Diagnosis not present

## 2022-03-21 DIAGNOSIS — Z833 Family history of diabetes mellitus: Secondary | ICD-10-CM | POA: Diagnosis not present

## 2022-03-21 DIAGNOSIS — I509 Heart failure, unspecified: Secondary | ICD-10-CM

## 2022-03-21 DIAGNOSIS — I619 Nontraumatic intracerebral hemorrhage, unspecified: Secondary | ICD-10-CM | POA: Diagnosis present

## 2022-03-21 DIAGNOSIS — Z8249 Family history of ischemic heart disease and other diseases of the circulatory system: Secondary | ICD-10-CM | POA: Diagnosis not present

## 2022-03-21 DIAGNOSIS — I5032 Chronic diastolic (congestive) heart failure: Secondary | ICD-10-CM | POA: Diagnosis present

## 2022-03-21 DIAGNOSIS — M1990 Unspecified osteoarthritis, unspecified site: Secondary | ICD-10-CM | POA: Diagnosis present

## 2022-03-21 DIAGNOSIS — I10 Essential (primary) hypertension: Secondary | ICD-10-CM | POA: Diagnosis present

## 2022-03-21 DIAGNOSIS — Z8673 Personal history of transient ischemic attack (TIA), and cerebral infarction without residual deficits: Secondary | ICD-10-CM

## 2022-03-21 DIAGNOSIS — E785 Hyperlipidemia, unspecified: Secondary | ICD-10-CM | POA: Diagnosis present

## 2022-03-21 DIAGNOSIS — M4316 Spondylolisthesis, lumbar region: Secondary | ICD-10-CM | POA: Diagnosis not present

## 2022-03-21 DIAGNOSIS — K219 Gastro-esophageal reflux disease without esophagitis: Secondary | ICD-10-CM | POA: Diagnosis present

## 2022-03-21 DIAGNOSIS — Z006 Encounter for examination for normal comparison and control in clinical research program: Secondary | ICD-10-CM

## 2022-03-21 DIAGNOSIS — Z888 Allergy status to other drugs, medicaments and biological substances status: Secondary | ICD-10-CM

## 2022-03-21 HISTORY — PX: TRANSCATHETER AORTIC VALVE REPLACEMENT, TRANSFEMORAL: SHX6400

## 2022-03-21 HISTORY — PX: INTRAOPERATIVE TRANSTHORACIC ECHOCARDIOGRAM: SHX6523

## 2022-03-21 HISTORY — DX: Nonrheumatic aortic (valve) stenosis: I35.0

## 2022-03-21 HISTORY — DX: Presence of prosthetic heart valve: Z95.2

## 2022-03-21 HISTORY — DX: Chronic diastolic (congestive) heart failure: I50.32

## 2022-03-21 LAB — POCT I-STAT, CHEM 8
BUN: 19 mg/dL (ref 8–23)
Calcium, Ion: 1.26 mmol/L (ref 1.15–1.40)
Chloride: 107 mmol/L (ref 98–111)
Creatinine, Ser: 0.7 mg/dL (ref 0.44–1.00)
Glucose, Bld: 148 mg/dL — ABNORMAL HIGH (ref 70–99)
HCT: 31 % — ABNORMAL LOW (ref 36.0–46.0)
Hemoglobin: 10.5 g/dL — ABNORMAL LOW (ref 12.0–15.0)
Potassium: 3.6 mmol/L (ref 3.5–5.1)
Sodium: 140 mmol/L (ref 135–145)
TCO2: 24 mmol/L (ref 22–32)

## 2022-03-21 LAB — ECHOCARDIOGRAM LIMITED
AR max vel: 1.45 cm2
AV Area VTI: 3.44 cm2
AV Area mean vel: 3.37 cm2
AV Mean grad: 3 mmHg
AV Peak grad: 34.4 mmHg
Ao pk vel: 2.93 m/s
MV VTI: 1.36 cm2

## 2022-03-21 SURGERY — IMPLANTATION, AORTIC VALVE, TRANSCATHETER, FEMORAL APPROACH
Anesthesia: General | Site: Chest

## 2022-03-21 MED ORDER — ACETAMINOPHEN 650 MG RE SUPP: 650.0000 mg | Freq: Four times a day (QID) | RECTAL | Status: DC | PRN

## 2022-03-21 MED ORDER — CEFAZOLIN SODIUM-DEXTROSE 2-4 GM/100ML-% IV SOLN
2.0000 g | Freq: Three times a day (TID) | INTRAVENOUS | Status: AC
  Administered 2022-03-21 – 2022-03-22 (×2): 2 g via INTRAVENOUS
  Filled 2022-03-21 (×2): qty 100

## 2022-03-21 MED ORDER — FENTANYL CITRATE (PF) 100 MCG/2ML IJ SOLN
INTRAMUSCULAR | Status: AC
  Filled 2022-03-21: qty 2

## 2022-03-21 MED ORDER — PRAMIPEXOLE DIHYDROCHLORIDE 0.25 MG PO TABS
0.2500 mg | ORAL_TABLET | Freq: Once | ORAL | Status: AC
  Administered 2022-03-21: 0.25 mg via ORAL
  Filled 2022-03-21: qty 1

## 2022-03-21 MED ORDER — LACTATED RINGERS IV SOLN: INTRAVENOUS | Status: DC | PRN

## 2022-03-21 MED ORDER — CHLORHEXIDINE GLUCONATE 4 % EX LIQD: 30.0000 mL | CUTANEOUS | Status: DC

## 2022-03-21 MED ORDER — NITROGLYCERIN IN D5W 200-5 MCG/ML-% IV SOLN: 0.0000 ug/min | INTRAVENOUS | Status: DC

## 2022-03-21 MED ORDER — SODIUM CHLORIDE 0.9 % IV SOLN: 250.0000 mL | INTRAVENOUS | Status: DC | PRN

## 2022-03-21 MED ORDER — ONDANSETRON HCL 4 MG/2ML IJ SOLN: 4.0000 mg | Freq: Four times a day (QID) | INTRAMUSCULAR | Status: DC | PRN

## 2022-03-21 MED ORDER — CHLORHEXIDINE GLUCONATE 0.12 % MT SOLN
15.0000 mL | Freq: Once | OROMUCOSAL | Status: AC
Start: 2022-03-21 — End: 2022-03-21

## 2022-03-21 MED ORDER — ROSUVASTATIN CALCIUM 5 MG PO TABS
20.0000 mg | ORAL_TABLET | Freq: Every day | ORAL | Status: DC
  Administered 2022-03-21 – 2022-03-22 (×2): 20 mg via ORAL
  Filled 2022-03-21 (×2): qty 4

## 2022-03-21 MED ORDER — SODIUM CHLORIDE 0.9 % IV SOLN: INTRAVENOUS | Status: AC

## 2022-03-21 MED ORDER — PROTAMINE SULFATE 10 MG/ML IV SOLN
INTRAVENOUS | Status: DC | PRN
  Administered 2022-03-21: 120 mg via INTRAVENOUS

## 2022-03-21 MED ORDER — CHLORHEXIDINE GLUCONATE 0.12 % MT SOLN
OROMUCOSAL | Status: AC
  Administered 2022-03-21: 15 mL via OROMUCOSAL
  Filled 2022-03-21: qty 15

## 2022-03-21 MED ORDER — PROTAMINE SULFATE 10 MG/ML IV SOLN
INTRAVENOUS | Status: AC
  Filled 2022-03-21: qty 10

## 2022-03-21 MED ORDER — SODIUM CHLORIDE 0.9% FLUSH: 3.0000 mL | INTRAVENOUS | Status: DC | PRN

## 2022-03-21 MED ORDER — HEPARIN (PORCINE) IN NACL 1000-0.9 UT/500ML-% IV SOLN
INTRAVENOUS | Status: AC
  Filled 2022-03-21: qty 1500

## 2022-03-21 MED ORDER — TRAMADOL HCL 50 MG PO TABS: 50.0000 mg | ORAL_TABLET | ORAL | Status: DC | PRN

## 2022-03-21 MED ORDER — SODIUM CHLORIDE 0.9% FLUSH
3.0000 mL | Freq: Two times a day (BID) | INTRAVENOUS | Status: DC
  Administered 2022-03-22: 3 mL via INTRAVENOUS

## 2022-03-21 MED ORDER — SODIUM CHLORIDE 0.9 % IV SOLN: INTRAVENOUS | Status: DC

## 2022-03-21 MED ORDER — FENTANYL CITRATE (PF) 100 MCG/2ML IJ SOLN
INTRAMUSCULAR | Status: DC | PRN
  Administered 2022-03-21: 25 ug via INTRAVENOUS

## 2022-03-21 MED ORDER — CHLORHEXIDINE GLUCONATE 4 % EX LIQD: 60.0000 mL | Freq: Once | CUTANEOUS | Status: DC

## 2022-03-21 MED ORDER — LIDOCAINE HCL (PF) 1 % IJ SOLN
INTRAMUSCULAR | Status: DC | PRN
  Administered 2022-03-21: 15 mL

## 2022-03-21 MED ORDER — POTASSIUM CHLORIDE CRYS ER 20 MEQ PO TBCR
20.0000 meq | EXTENDED_RELEASE_TABLET | Freq: Every day | ORAL | Status: DC
  Administered 2022-03-22: 20 meq via ORAL
  Filled 2022-03-21: qty 1

## 2022-03-21 MED ORDER — ASPIRIN 81 MG PO TBEC
81.0000 mg | DELAYED_RELEASE_TABLET | Freq: Every day | ORAL | Status: DC
  Administered 2022-03-21 – 2022-03-22 (×2): 81 mg via ORAL
  Filled 2022-03-21 (×2): qty 1

## 2022-03-21 MED ORDER — MORPHINE SULFATE (PF) 2 MG/ML IV SOLN: 1.0000 mg | INTRAVENOUS | Status: DC | PRN

## 2022-03-21 MED ORDER — IOHEXOL 350 MG/ML SOLN
INTRAVENOUS | Status: DC | PRN
  Administered 2022-03-21: 30 mL

## 2022-03-21 MED ORDER — LIDOCAINE HCL 1 % IJ SOLN
INTRAMUSCULAR | Status: AC
  Filled 2022-03-21: qty 20

## 2022-03-21 MED ORDER — PROPOFOL 500 MG/50ML IV EMUL
INTRAVENOUS | Status: DC | PRN
  Administered 2022-03-21: 20 ug/kg/min via INTRAVENOUS

## 2022-03-21 MED ORDER — HEPARIN SODIUM (PORCINE) 1000 UNIT/ML IJ SOLN
INTRAMUSCULAR | Status: DC | PRN
  Administered 2022-03-21: 12000 [IU] via INTRAVENOUS

## 2022-03-21 MED ORDER — HEPARIN (PORCINE) IN NACL 1000-0.9 UT/500ML-% IV SOLN
INTRAVENOUS | Status: DC | PRN
  Administered 2022-03-21 (×2): 500 mL

## 2022-03-21 MED ORDER — PRAMIPEXOLE DIHYDROCHLORIDE 0.25 MG PO TABS
0.2500 mg | ORAL_TABLET | Freq: Two times a day (BID) | ORAL | Status: DC
  Administered 2022-03-21 – 2022-03-22 (×2): 0.25 mg via ORAL
  Filled 2022-03-21 (×2): qty 1

## 2022-03-21 MED ORDER — OXYCODONE HCL 5 MG PO TABS: 5.0000 mg | ORAL_TABLET | ORAL | Status: DC | PRN

## 2022-03-21 MED ORDER — FUROSEMIDE 40 MG PO TABS
40.0000 mg | ORAL_TABLET | Freq: Every day | ORAL | Status: DC
  Administered 2022-03-22: 40 mg via ORAL
  Filled 2022-03-21: qty 1

## 2022-03-21 MED ORDER — LACTATED RINGERS IV SOLN
INTRAVENOUS | Status: DC
Start: 2022-03-21 — End: 2022-03-21

## 2022-03-21 MED ORDER — ORAL CARE MOUTH RINSE: 15.0000 mL | Freq: Once | OROMUCOSAL | Status: AC

## 2022-03-21 MED ORDER — ACETAMINOPHEN 325 MG PO TABS
650.0000 mg | ORAL_TABLET | Freq: Four times a day (QID) | ORAL | Status: DC | PRN
  Administered 2022-03-21: 650 mg via ORAL
  Filled 2022-03-21: qty 2

## 2022-03-21 MED ORDER — ONDANSETRON HCL 4 MG/2ML IJ SOLN
INTRAMUSCULAR | Status: DC | PRN
  Administered 2022-03-21: 4 mg via INTRAVENOUS

## 2022-03-21 SURGICAL SUPPLY — 27 items
CABLE ADAPT PACING TEMP 12FT (ADAPTER) IMPLANT
CATH INFINITI 5 FR STR PIGTAIL (CATHETERS) IMPLANT
CATH INFINITI 5FR ANG PIGTAIL (CATHETERS) IMPLANT
CATH INFINITI 6F AL1 (CATHETERS) IMPLANT
CATH S G BIP PACING (CATHETERS) IMPLANT
CLOSURE MYNX CONTROL 6F/7F (Vascular Products) IMPLANT
CLOSURE PERCLOSE PROSTYLE (VASCULAR PRODUCTS) IMPLANT
GUIDEWIRE SAFE TJ AMPLATZ EXST (WIRE) IMPLANT
KIT HEART LEFT (KITS) ×2 IMPLANT
KIT MICROPUNCTURE NIT STIFF (SHEATH) IMPLANT
PACK CARDIAC CATHETERIZATION (CUSTOM PROCEDURE TRAY) IMPLANT
PAD ELECT DEFIB RADIOL ZOLL (MISCELLANEOUS) ×2 IMPLANT
SHEATH BRITE TIP 7FR 35CM (SHEATH) IMPLANT
SHEATH PINNACLE 6F 10CM (SHEATH) IMPLANT
SHEATH PINNACLE 8F 10CM (SHEATH) IMPLANT
SLEEVE REPOSITIONING LENGTH 30 (MISCELLANEOUS) IMPLANT
STOPCOCK MORSE 400PSI 3WAY (MISCELLANEOUS) ×4 IMPLANT
SYS EVOLUT FX DELIVERY 23-29 (CATHETERS) ×2
SYS EVOLUT FX LOADING 23-29 (CATHETERS) ×2
SYSTEM EVOLUT FX DELIVRY 23-29 (CATHETERS) IMPLANT
SYSTEM EVOLUT FX LOADING 23-29 (CATHETERS) IMPLANT
VALVE EVOLUT FX 26 (Valve) IMPLANT
WIRE AMPLATZ SS-J .035X180CM (WIRE) IMPLANT
WIRE EMERALD 3MM-J .035X150CM (WIRE) IMPLANT
WIRE EMERALD 3MM-J .035X260CM (WIRE) IMPLANT
WIRE EMERALD ST .035X260CM (WIRE) IMPLANT
WIRE SAFARI SM CURVE 275 (WIRE) IMPLANT

## 2022-03-21 NOTE — Transfer of Care (Signed)
Immediate Anesthesia Transfer of Care Note  Patient: Tiffany Washington  Procedure(s) Performed: Transcatheter Aortic Valve Replacement, Transfemoral (Chest) INTRAOPERATIVE TRANSTHORACIC ECHOCARDIOGRAM  Patient Location: PACU and Cath Lab  Anesthesia Type:MAC  Level of Consciousness: drowsy and patient cooperative  Airway & Oxygen Therapy: Patient Spontanous Breathing  Post-op Assessment: Report given to RN and Post -op Vital signs reviewed and stable  Post vital signs: Reviewed and stable  Last Vitals:  Vitals Value Taken Time  BP    Temp    Pulse 64 03/21/22 1546  Resp 19 03/21/22 1546  SpO2 98 % 03/21/22 1546  Vitals shown include unvalidated device data.  Last Pain:  Vitals:   03/21/22 1132  TempSrc: Oral  PainSc: 0-No pain         Complications: No notable events documented.

## 2022-03-21 NOTE — Anesthesia Postprocedure Evaluation (Signed)
Anesthesia Post Note  Patient: Tiffany Washington  Procedure(s) Performed: Transcatheter Aortic Valve Replacement, Transfemoral (Chest) INTRAOPERATIVE TRANSTHORACIC ECHOCARDIOGRAM     Patient location during evaluation: PACU Anesthesia Type: General Level of consciousness: sedated and patient cooperative Pain management: pain level controlled Vital Signs Assessment: post-procedure vital signs reviewed and stable Respiratory status: spontaneous breathing Cardiovascular status: stable Anesthetic complications: no   No notable events documented.  Last Vitals:  Vitals:   03/21/22 1545 03/21/22 1620  BP: (!) 115/49 (!) 115/55  Pulse: 64 66  Resp: 18 14  Temp: 36.6 C 36.6 C  SpO2:      Last Pain:  Vitals:   03/21/22 1620  TempSrc: Temporal  PainSc: 0-No pain                 Nolon Nations

## 2022-03-21 NOTE — Progress Notes (Signed)
  Echocardiogram 2D Echocardiogram has been performed.  Tiffany Washington 03/21/2022, 3:18 PM

## 2022-03-21 NOTE — Progress Notes (Signed)
   20g,  Left radial arterial line was pulled, and manual pressure was held for 10 min. Hemostasis was obtained and sterile gauze was placed at the site.  Radial pulse was 2+, and the capillary refill was , 3 sec.

## 2022-03-21 NOTE — Progress Notes (Signed)
Pt came to room 17 from cath lab. CHG wipe given. Initiated tele. VSS. Call bell within reach.   Lavenia Atlas, RN

## 2022-03-21 NOTE — Op Note (Signed)
[facility]   MULTIDISCIPLINARY HEART VALVE TEAM     TAVR OPERATIVE NOTE    Tiffany Washington [id number]  Date of Procedure:                 03/21/2022   Preoperative Diagnosis:      Severe Aortic Stenosis    Postoperative Diagnosis:    Same    Procedure:        Transcatheter Aortic Valve Replacement - Percutaneous Right Transfemoral Approach             Medtronic Evolut-FX  (size 26 mm, model # [id number]-26, serial # [id number])              Co-Surgeons:            Gaye Pollack, MD and Lauree Chandler, MD     Anesthesiologist:                  Elio Forget, MD   Echocardiographer:              Edmonia James, MD   Pre-operative Echo Findings: Severe aortic stenosis   Normal left ventricular systolic function   Post-operative Echo Findings: No paravalvular leak Normal left ventricular systolic function     BRIEF CLINICAL NOTE AND INDICATIONS FOR SURGERY    This 81 year old woman has stage D, severe, symptomatic aortic stenosis with NYHA class II symptoms of exertional fatigue and shortness of breath consistent with chronic diastolic congestive heart failure.  She was admitted in May 2023 with acute on chronic diastolic heart failure responding to diuresis.  I have personally reviewed her 2D echocardiogram, cardiac catheterization, and CTA studies.  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  The mean gradient is 33.2 mmHg with a valve area of 0.77 cm.  There is also moderate to severe mitral regurgitation and moderate mitral stenosis which may be contributing to her symptoms.  Cardiac catheterization shows nonobstructive coronary disease.  The peak to peak aortic valve gradient was 25 mmHg with a mean transmitral gradient of 11 mmHg.  I agree that aortic valve replacement is indicated in this patient for relief of her symptoms and to prevent left ventricular dysfunction.   She does have significant mitral valve disease but I do not think she is a candidate for open surgical aortic and mitral valve replacements due to the combination of her age and severe arthritis with limited mobility as well as the severity of her mitral annular calcification.  I think that transcatheter aortic valve replacement would be the best option for treating her.  Her gated cardiac CTA shows anatomy suitable for TAVR although she has a relatively small annulus.  Her anatomy appears suitable for a 26 mm Medtronic Evolut valve.  Her abdominal and pelvic CTA shows adequate pelvic vascular anatomy to allow transfemoral insertion.   The patient and her husband were counseled at length regarding treatment alternatives for management of severe symptomatic aortic stenosis. The risks and benefits of surgical intervention has been discussed in detail. Long-term prognosis with medical therapy was discussed. Alternative approaches such as conventional surgical aortic valve replacement, transcatheter aortic valve replacement, and palliative medical therapy were compared and contrasted at length. This discussion was placed in the context of the patient's own specific clinical presentation and past medical history. All of their questions have been addressed.    Following the decision to proceed  with transcatheter aortic valve replacement, a discussion was held regarding what types of management strategies would be attempted intraoperatively in the event of life-threatening complications, including whether or not the patient would be considered a candidate for the use of cardiopulmonary bypass and/or conversion to open sternotomy for attempted surgical intervention.  I do not think she is a candidate for emergent sternotomy to manage any intraoperative complications given the combination of her age, restricted mobility, and moderate to severe mitral valve regurgitation and stenosis with a severely calcified mitral  annulus.  The patient is aware of the fact that transient use of cardiopulmonary bypass may be necessary. The patient has been advised of a variety of complications that might develop including but not limited to risks of death, stroke, paravalvular leak, aortic dissection or other major vascular complications, aortic annulus rupture, device embolization, cardiac rupture or perforation, mitral regurgitation, acute myocardial infarction, arrhythmia, heart block or bradycardia requiring permanent pacemaker placement, congestive heart failure, respiratory failure, renal failure, pneumonia, infection, other late complications related to structural valve deterioration or migration, or other complications that might ultimately cause a temporary or permanent loss of functional independence or other long term morbidity. The patient provides full informed consent for the procedure as described and all questions were answered.        DETAILS OF THE OPERATIVE PROCEDURE   PREPARATION:     The patient is brought to the operating room on the above mentioned date and central monitoring was established by the anesthesia team including placement of a radial arterial line. The patient is placed in the supine position on the operating table.  Intravenous antibiotics are administered. The patient was monitored by anesthesia under conscious sedation.   Baseline transthoracic echocardiogram was performed. The patient's abdomen and both groins were prepared and draped in a sterile manner. A time out procedure was performed.     PERIPHERAL ACCESS:     Using the modified Seldinger technique, femoral arterial and venous access was obtained with placement of 6 Fr sheaths on the left side.  A pigtail diagnostic catheter was passed through the left arterial sheath under fluoroscopic guidance into the aortic root.  A temporary transvenous pacemaker catheter was passed through the left femoral venous sheath under fluoroscopic  guidance into the right ventricle.  The pacemaker was tested to ensure stable lead placement and pacemaker capture.      TRANSFEMORAL ACCESS:    Percutaneous transfemoral access and sheath placement was performed using ultrasound guidance.  The right common femoral artery was cannulated using a micropuncture needle.  A pair of Abbott Perclose percutaneous closure devices were placed and a 6 French sheath replaced into the femoral artery.  The patient was heparinized systemically and ACT verified > 250 seconds.      An AL-1 catheter was used to direct a straight-tip exchange length wire across the native aortic valve into the left ventricle. This was exchanged out for a pigtail catheter and position was confirmed in the LV apex. Simultaneous LV and Ao pressures were recorded.  The pigtail catheter was exchanged for a Safari wire in the LV apex.      BALLOON AORTIC VALVULOPLASTY:    Not performed   TRANSCATHETER HEART VALVE DEPLOYMENT:    A Medtronic Evolut-FX transcatheter heart valve (size 26 mm) was prepared and loaded into the delivery catheter system per manufacturer's guidelines and the proper orientation of the valve is confirmed under fluoroscopy. The delivery system and inline sheath were inserted into the right common  femoral artery over the [facility] wire and the inline sheath advanced into the abdominal aorta under fluoroscopic guidance. The delivery catheter was advanced around the aortic arch and the valve was carefully positioned across the aortic valve annulus. An aortic root injection was performed to confirm position and the valve deployed using the cusp overlap technique under fluoroscopic guidance. Intermittent pacing was used during valve deployment. The delivery system and guidewire were retracted into the descending aorta and the nosecone re-sheathed. Valve function is assessed using echocardiography. There is felt to be no paravalvular leak and no central aortic insufficiency. The  patient's hemodynamic recovery following valve deployment is good.        PROCEDURE COMPLETION:    The delivery system and in-line sheath were removed and femoral artery closure performed.  Protamine was administered once femoral arterial repair was complete. The temporary pacemaker, pigtail catheters and femoral sheaths were removed with manual pressure used for hemostasis of the vein and Mynx closure for the left femoral artery.   The patient tolerated the procedure well and is transported to the cath lab recovery in stable condition. There were no immediate intraoperative complications. All sponge instrument and needle counts are verified correct at completion of the operation.    No blood products were administered during the operation.   The patient received a total of 30 mL of intravenous contrast during the procedure.     Gaye Pollack, MD

## 2022-03-21 NOTE — Anesthesia Preprocedure Evaluation (Addendum)
Anesthesia Evaluation  Patient identified by MRN, date of birth, ID band Patient awake    Reviewed: Allergy & Precautions, NPO status , Patient's Chart, lab work & pertinent test results  History of Anesthesia Complications (+) AWARENESS UNDER ANESTHESIANegative for: history of anesthetic complications  Airway Mallampati: III  TM Distance: >3 FB Neck ROM: Full    Dental  (+) Dental Advisory Given, Edentulous Upper, Partial Lower   Pulmonary neg pulmonary ROS,    Pulmonary exam normal        Cardiovascular hypertension, Pt. on medications pulmonary hypertension+ CAD and +CHF  + Valvular Problems/Murmurs AS  Rhythm:Regular Rate:Normal + Systolic murmurs Echo 07/4780 1. Left ventricular ejection fraction, by estimation, is >75%. The left ventricle has hyperdynamic function. The left ventricle has no regional wall motion abnormalities. Left ventricular diastolic parameters are consistent with Grade II diastolic dysfunction (pseudonormalization).  2. Right ventricular systolic function is normal. The right ventricular size is normal. There is severely elevated pulmonary artery systolic pressure. The estimated right ventricular systolic pressure is 95.6 mmHg.  3. Left atrial size was moderately dilated.  4. Splay artifact. The mitral valve is degenerative. Moderate to severe mitral valve regurgitation. Moderate mitral stenosis. The mean mitral valve gradient is 8.0 mmHg. Severe mitral annular calcification.  5. Tricuspid valve regurgitation is moderate.  6. The aortic valve is calcified. There is moderate calcification of the aortic valve. There is moderate thickening of the aortic valve. Aortic valve regurgitation is mild. Moderate to severe aortic valve stenosis. Aortic regurgitation PHT measures 426 msec. Aortic valve area, by VTI measures 0.82 cm. Aortic valve mean gradient measures 33.2 mmHg. Aortic valve Vmax measures 3.96 m/s.   7. The inferior vena cava is normal in size with greater than 50% respiratory variability, suggesting right atrial pressure of 3 mmHg.    Neuro/Psych  Neuromuscular disease CVA    GI/Hepatic Neg liver ROS, GERD  ,  Endo/Other  negative endocrine ROS  Renal/GU negative Renal ROS     Musculoskeletal  (+) Arthritis ,   Abdominal   Peds  Hematology negative hematology ROS (+)   Anesthesia Other Findings Day of surgery medications reviewed with the patient.  Reproductive/Obstetrics                           Anesthesia Physical  Anesthesia Plan  ASA: 4  Anesthesia Plan: General   Post-op Pain Management: Minimal or no pain anticipated and Precedex   Induction: Intravenous  PONV Risk Score and Plan: 2 and Ondansetron, Dexamethasone, Treatment may vary due to age or medical condition and TIVA  Airway Management Planned: Natural Airway  Additional Equipment:   Intra-op Plan:   Post-operative Plan:   Informed Consent: I have reviewed the patients History and Physical, chart, labs and discussed the procedure including the risks, benefits and alternatives for the proposed anesthesia with the patient or authorized representative who has indicated his/her understanding and acceptance.     Dental advisory given  Plan Discussed with: CRNA  Anesthesia Plan Comments:        Anesthesia Quick Evaluation

## 2022-03-21 NOTE — CV Procedure (Signed)
[facility]  TAVR OPERATIVE NOTE   Date of Procedure:  03/21/2022  Preoperative Diagnosis: Severe Aortic Stenosis   Postoperative Diagnosis: Same   Procedure:   Transcatheter Aortic Valve Replacement - Transfemoral Approach  Medtronic Evolut Pro THV (size 26 mm, model # [id number] , serial # [id number])   Co-Surgeons:  Lauree Chandler, MD and Gaye Pollack, MD   Anesthesiologist:  Lissa Hoard  Echocardiographer:  Johnsie Cancel  Pre-operative Echo Findings: Severe aortic stenosis Normal left ventricular systolic function  Post-operative Echo Findings: No paravalvular leak Normal left ventricular systolic function  BRIEF CLINICAL NOTE AND INDICATIONS FOR SURGERY  Ms. Rhinehart is a pleasant 81 yo female with history of CAD, prior ICH, HTN, mild carotid artery disease, moderate MR/MS and severe aortic stenosis who is here today for TAVR. She was admitted to [facility] 12/04/21 with acute hypoxic respiratory failure and was found to have LE edema and O2 sats of 83%. . She described dyspnea for a month that progressively worsened as well as LE edema. Echo with normal LV function with severe aortic stenosis with mean gradient 33 mmHg, SVI 34, DI 0.32 and AVA 0.77cm2. Cardiac cath moderate non-obstructive CAD.   During the course of the patient's preoperative work up they have been evaluated comprehensively by a multidisciplinary team of specialists coordinated through the [facility] in the [facility].  They have been demonstrated to suffer from symptomatic severe aortic stenosis as noted above. The patient has been counseled extensively as to the relative risks and benefits of all options for the treatment of severe aortic stenosis including long term medical therapy, conventional surgery for aortic valve replacement, and transcatheter aortic valve replacement.  The patient has been independently evaluated by Dr. Cyndia Bent with CT surgery  and they are felt to be at high risk for conventional surgical aortic valve replacement. The surgeon indicated the patient would be a poor candidate for conventional surgery. Based upon review of all of the patient's preoperative diagnostic tests they are felt to be candidate for transcatheter aortic valve replacement using the transfemoral approach as an alternative to high risk conventional surgery.    Following the decision to proceed with transcatheter aortic valve replacement, a discussion has been held regarding what types of management strategies would be attempted intraoperatively in the event of life-threatening complications, including whether or not the patient would be considered a candidate for the use of cardiopulmonary bypass and/or conversion to open sternotomy for attempted surgical intervention.  The patient has been advised of a variety of complications that might develop peculiar to this approach including but not limited to risks of death, stroke, paravalvular leak, aortic dissection or other major vascular complications, aortic annulus rupture, device embolization, cardiac rupture or perforation, acute myocardial infarction, arrhythmia, heart block or bradycardia requiring permanent pacemaker placement, congestive heart failure, respiratory failure, renal failure, pneumonia, infection, other late complications related to structural valve deterioration or migration, or other complications that might ultimately cause a temporary or permanent loss of functional independence or other long term morbidity.  The patient provides full informed consent for the procedure as described and all questions were answered preoperatively.    DETAILS OF THE OPERATIVE PROCEDURE  PREPARATION:   The patient is brought to the operating room on the above mentioned date and central monitoring was established by the anesthesia team including placement of a radial arterial line. The patient is placed in the  supine position on the operating table.  Intravenous antibiotics  are administered. Conscious sedation is used.   Baseline transthoracic echocardiogram was performed. The patient's chest, abdomen, both groins, and both lower extremities are prepared and draped in a sterile manner. A time out procedure is performed.   PERIPHERAL ACCESS:   Using the modified Seldinger technique, femoral arterial and venous access were obtained with placement of a 6 Fr sheath in the artery and a 7 Fr sheath in the vein on the left side using u/s guidance.  A pigtail diagnostic catheter was passed through the femoral arterial sheath under fluoroscopic guidance into the aortic root.  A temporary transvenous pacemaker catheter was passed through the femoral venous sheath under fluoroscopic guidance into the right ventricle.  The pacemaker was tested to ensure stable lead placement and pacemaker capture. Aortic root angiography was performed in order to determine the optimal angiographic angle for valve deployment.  TRANSFEMORAL ACCESS:  A micropuncture kit was used to gain access to the right femoral artery using u/s guidance. Position confirmed with angiography. Pre-closure with double ProGlide closure devices. The patient was heparinized systemically and ACT verified > 250 seconds.  An AL-1 catheter was then used to direct a straight wire across the aortic valve. A J wire was then advanced through the AL-1 and this catheter was changed for a pigtail catheter. Simultaneous LV and Ao pressures were recorded.   A Confida wire then advanced through a pigtail catheter and placed in the LV apex.   TRANSCATHETER HEART VALVE DEPLOYMENT:  A Medtronic Evolut Pro THV size 26 mmg was prepared per manufacturer's guidelines. The valve was advanced through the introducer sheath using normal technique until in an appropriate position in the ascending aorta. The valve was carefully positioned across the aortic valve annulus. Pacing is  started at 115 bpm. The valve is then deployed. Valve function is assessed using TTE. There is felt to be no paravalvular leak and no central aortic insufficiency.  The patient's hemodynamic recovery following valve deployment is good.  The deployment system and guidewire are both removed. Echo demostrated acceptable post-procedural gradients, stable mitral valve function, and no AI.   PROCEDURE COMPLETION:  The sheath and delivery system were removed and closure devices were completed. Protamine was administered once femoral arterial repair was complete. The temporary pacemaker, pigtail catheters and femoral sheaths were removed with a Mynx closure device placed in the artery and manual pressure used for venous hemostasis.    The patient tolerated the procedure well and is transported to the surgical intensive care in stable condition. There were no immediate intraoperative complications. All sponge instrument and needle counts are verified correct at completion of the operation.   No blood products were administered during the operation.  The patient received a total of 30 mL of intravenous contrast during the procedure.  LVEDP: 22 mmHg  Lauree Chandler, MD 03/21/2022 3:32 PM

## 2022-03-21 NOTE — Progress Notes (Signed)
  [facility]   MULTIDISCIPLINARY HEART VALVE TEAM  Patient is recovering in cath lab after TAVR. She is hemodynamically stable. Groin sites stable. ECG with QRS widening but no high grade block. Plan to DC arterial line and transfer to 4E. She is noted to have total body jerking that she had prior to case and require paralytics and heavy sedation to control during surgery. Given heavy sedation she is difficult to wake up but was finally oriented to time and place after a while. Will continue to monitor.   Angelena Form PA-C  [facility]  Pager [phone]

## 2022-03-22 ENCOUNTER — Encounter (HOSPITAL_COMMUNITY): Payer: Self-pay | Admitting: Cardiovascular Disease

## 2022-03-22 ENCOUNTER — Inpatient Hospital Stay (HOSPITAL_COMMUNITY): Payer: PPO

## 2022-03-22 DIAGNOSIS — Z952 Presence of prosthetic heart valve: Secondary | ICD-10-CM | POA: Diagnosis not present

## 2022-03-22 DIAGNOSIS — I5032 Chronic diastolic (congestive) heart failure: Secondary | ICD-10-CM | POA: Diagnosis not present

## 2022-03-22 DIAGNOSIS — I350 Nonrheumatic aortic (valve) stenosis: Secondary | ICD-10-CM | POA: Diagnosis not present

## 2022-03-22 DIAGNOSIS — Z006 Encounter for examination for normal comparison and control in clinical research program: Secondary | ICD-10-CM | POA: Diagnosis not present

## 2022-03-22 DIAGNOSIS — I2722 Pulmonary hypertension due to left heart disease: Secondary | ICD-10-CM | POA: Diagnosis not present

## 2022-03-22 LAB — BASIC METABOLIC PANEL
Anion gap: 7 (ref 5–15)
BUN: 21 mg/dL (ref 8–23)
CO2: 21 mmol/L — ABNORMAL LOW (ref 22–32)
Calcium: 8.8 mg/dL — ABNORMAL LOW (ref 8.9–10.3)
Chloride: 109 mmol/L (ref 98–111)
Creatinine, Ser: 0.74 mg/dL (ref 0.44–1.00)
GFR, Estimated: >60 mL/min (ref >60)
Glucose, Bld: 122 mg/dL — ABNORMAL HIGH (ref 70–99)
Potassium: 3.7 mmol/L (ref 3.5–5.1)
Sodium: 137 mmol/L (ref 135–145)

## 2022-03-22 LAB — POCT I-STAT, CHEM 8
BUN: 21 mg/dL (ref 8–23)
BUN: 20 mg/dL (ref 8–23)
Calcium, Ion: 1.23 mmol/L (ref 1.15–1.40)
Calcium, Ion: 1.28 mmol/L (ref 1.15–1.40)
Chloride: 107 mmol/L (ref 98–111)
Chloride: 105 mmol/L (ref 98–111)
Creatinine, Ser: 0.6 mg/dL (ref 0.44–1.00)
Creatinine, Ser: 0.6 mg/dL (ref 0.44–1.00)
Glucose, Bld: 102 mg/dL — ABNORMAL HIGH (ref 70–99)
Glucose, Bld: 144 mg/dL — ABNORMAL HIGH (ref 70–99)
HCT: 36 % (ref 36.0–46.0)
HCT: 33 % — ABNORMAL LOW (ref 36.0–46.0)
Hemoglobin: 12.2 g/dL (ref 12.0–15.0)
Hemoglobin: 11.2 g/dL — ABNORMAL LOW (ref 12.0–15.0)
Potassium: 3.6 mmol/L (ref 3.5–5.1)
Potassium: 3.5 mmol/L (ref 3.5–5.1)
Sodium: 142 mmol/L (ref 135–145)
Sodium: 140 mmol/L (ref 135–145)
TCO2: 22 mmol/L (ref 22–32)
TCO2: 22 mmol/L (ref 22–32)

## 2022-03-22 LAB — CBC
HCT: 29.8 % — ABNORMAL LOW (ref 36.0–46.0)
Hemoglobin: 10.3 g/dL — ABNORMAL LOW (ref 12.0–15.0)
MCH: 32.8 pg (ref 26.0–34.0)
MCHC: 34.6 g/dL (ref 30.0–36.0)
MCV: 94.9 fL (ref 80.0–100.0)
Platelets: 172 10*3/uL (ref 150–400)
RBC: 3.14 MIL/uL — ABNORMAL LOW (ref 3.87–5.11)
RDW: 13.2 % (ref 11.5–15.5)
WBC: 7.1 10*3/uL (ref 4.0–10.5)
nRBC: 0 % (ref 0.0–0.2)

## 2022-03-22 LAB — POCT I-STAT 7, (LYTES, BLD GAS, ICA,H+H)
Acid-base deficit: 1 mmol/L (ref 0.0–2.0)
Bicarbonate: 23.4 mmol/L (ref 20.0–28.0)
Calcium, Ion: 1.28 mmol/L (ref 1.15–1.40)
HCT: 32 % — ABNORMAL LOW (ref 36.0–46.0)
Hemoglobin: 10.9 g/dL — ABNORMAL LOW (ref 12.0–15.0)
O2 Saturation: 96 %
Potassium: 3.5 mmol/L (ref 3.5–5.1)
Sodium: 141 mmol/L (ref 135–145)
TCO2: 24 mmol/L (ref 22–32)
pCO2 arterial: 35.6 mmHg (ref 32–48)
pH, Arterial: 7.426 (ref 7.35–7.45)
pO2, Arterial: 79 mmHg — ABNORMAL LOW (ref 83–108)

## 2022-03-22 LAB — ECHOCARDIOGRAM COMPLETE
AR max vel: 2.96 cm2
AV Area VTI: 2.66 cm2
AV Area mean vel: 2.79 cm2
AV Mean grad: 2 mmHg
AV Peak grad: 4 mmHg
Ao pk vel: 1 m/s
Area-P 1/2: 2.7 cm2
Height: 64 in
MV VTI: 1.17 cm2
S' Lateral: 2.1 cm
Weight: 2760 oz

## 2022-03-22 LAB — MAGNESIUM: Magnesium: 1.9 mg/dL (ref 1.7–2.4)

## 2022-03-22 NOTE — Progress Notes (Signed)
Patient discharging home with the support of her husband. IVs removed without complications. Tele removed and CCMD notified. Discharge instructions discussed and medication administration completed. All questions answered.  Tiffany Washington

## 2022-03-22 NOTE — Discharge Instructions (Signed)

## 2022-03-22 NOTE — Progress Notes (Signed)
   03/22/22 0429  Mobility  Activity Ambulated independently in hallway  Range of Motion/Exercises Active;All extremities  Level of Assistance Modified independent, requires aide device or extra time  Assistive Device Front wheel walker  Distance Ambulated (ft) 200 ft  Activity Response Tolerated well

## 2022-03-22 NOTE — Progress Notes (Signed)
  Echocardiogram 2D Echocardiogram has been performed.  [name] 03/22/2022, 9:38 AM

## 2022-03-22 NOTE — Discharge Summary (Cosign Needed Addendum)
Tiffany Washington  Discharge Summary    Patient ID: Tiffany Washington MRN: [id number]; DOB: [birth year]  Admit date: 03/21/2022 Discharge date: 03/22/2022  Primary Care Provider: Velna Hatchet, MD  Primary Cardiologist: Minus Breeding, MD / Dr. Angelena Form & Dr. Cyndia Bent (TAVR)  Discharge Diagnoses    Principal Problem:   S/P TAVR (transcatheter aortic valve replacement) Active Problems:   Aortic stenosis, severe   Dyslipidemia   ICH (intracerebral hemorrhage) (HCC)   Coronary artery disease involving native coronary artery of native heart without angina pectoris   Essential hypertension   GERD (gastroesophageal reflux disease)   Chronic diastolic (congestive) heart failure (HCC)   Allergies Allergies  Allergen Reactions   Carbamazepine Other (See Comments)    Doesn't remember reaction   Ciprofloxacin Other (See Comments)    Patient stated she "cannot take this"   Diclofenac Other (See Comments)    Reaction not recalled   Indomethacin Other (See Comments)    Reaction not recalled   Meloxicam Other (See Comments)    Reaction not recalled   Nortriptyline Other (See Comments)    Reaction not recalled   Voltaren [Diclofenac Sodium] Other (See Comments)    Reaction not recalled   Gabapentin Other (See Comments)    Makes the patient unable to sleep   Pamelor [Nortriptyline Hcl] Other (See Comments)    insomnia   Ropinirole Other (See Comments)    Speed up the restless legs   Sulfa Antibiotics Diarrhea and Nausea And Vomiting    Diagnostic Studies/Procedures    TAVR OPERATIVE NOTE    Tiffany Washington [id number]   Date of Procedure:                 03/21/2022   Preoperative Diagnosis:      Severe Aortic Stenosis    Postoperative Diagnosis:    Same    Procedure:        Transcatheter Aortic Valve Replacement - Percutaneous Right Transfemoral Approach             Medtronic Evolut-FX  (size 26 mm, model # EVOLUTFX-26, serial  # [id number])              Co-Surgeons:            Gaye Pollack, MD and Lauree Chandler, MD     Anesthesiologist:                  Elio Forget, MD   Echocardiographer:              Edmonia James, MD   Pre-operative Echo Findings: Severe aortic stenosis   Normal left ventricular systolic function   Post-operative Echo Findings: No paravalvular leak Normal left ventricular systolic function _____________    Echo 03/22/22: completed but pending formal read at the time of discharge   History of Present Illness     Tiffany Washington is a 81 y.o. female with a history of intracranial hemorrhage 12/2020 felt hypertensive in etiology due to territory, non obstructive CAD, HTN, arthritis with limited mobility, chronic diastolic CHF with admission in 11/2021, mitral valve disease and severe AS who presented to [facility] on 03/21/22 for planned TAVR.   The patient is followed by Dr. Percival Spanish and has a history of aortic stenosis. She was admitted in 11/2021 for acute hypoxic respiratory failure secondary to acute diastolic heart failure and severe aortic stenosis. Echocardiogram showed a hyperdynamic LV at >75% with G2DD, splay artifact with  degenerative MV, moderate to severe MR, moderate MS, moderate TR, mild AI, and moderate to severe aortic stenosis which appeared to have progressed since prior echocardiogram.  Structural heart was consulted while she was admitted and she was seen by Dr. Angelena Form. L/RHC on 12/06/2021 with nonobstructive CAD, RA mean 4, PA mean 32 mmHg, PCWP mean 18 mmHg, LVEDP 15 mmHg, PVR 2.7 WU, Fick CO/CI 5.19/2.83, peak to peak aortic valve mean gradient 25 mmHg, transmitral mean gradient 11 mmHg with MV area 1.64 cm2. Pre-TAVR CTs were completed showing a small aortic annulus that sized to a 20 mm Edwards SAPIEN versus a 23 versus 26 Evolut Fx with adequate transfemoral access. She was diuresed with IV lasix and discharged home on 40 mg furosemide daily.    She was seen in the heart  failure outpatient clinic on 12/19/2021.  Weight stable between 162-163 lb. She was continued on her current medications.   The patient has been evaluated by the multidisciplinary valve [facility] and felt to have severe, symptomatic aortic stenosis and to be a suitable candidate for TAVR, which was set up for 03/21/22.   Hospital Course     Consultants: none   Severe AS: s/p successful TAVR with a 26 mm Medtronic Evolut FX THV via the TF approach on 03/21/22. Post operative echo completed but pending formal read. Groin sites are stable. ECG with sinus and no high grade heart block. Continue home Asprin '81mg'$  daily. Plan for discharge home today with close follow up in the office next week.    Chronic diastolic CHF: appears euvolemic. Continue furosemide 40 mg daily/Kdur 9mq daily.    Mitral valve disease: will follow on serial echos.    CAD: LHC 05/23 with 50% mid LAD, 70% D2. Continue high-intensity statin and aspirin.    HTN: well controlled. Resume home meds.   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 Has done okay back on aspirin.  _____________  Discharge Vitals Blood pressure (!) 114/56, pulse 62, temperature 98.2 F (36.8 C), temperature source Oral, resp. rate 16, height '5\' 4"'$  (1.626 m), weight 78.2 kg, SpO2 100 %.  Filed Weights   03/21/22 1132 03/22/22 0419  Weight: 76.2 kg 78.2 kg     GEN: Well nourished, well developed, in no acute distress HEENT: normal Neck: no JVD or masses Cardiac: RRR; no murmurs, rubs, or gallops,no edema  Respiratory:  clear to auscultation bilaterally, normal work of breathing GI: soft, nontender, nondistended, + BS MS: no deformity or atrophy Skin: warm and dry, no rash.  Groin sites clear without hematoma or ecchymosis  Neuro:  Alert and Oriented x 3, Strength and sensation are intact Psych: euthymic mood, full affect   Labs & Radiologic Studies    CBC Recent Labs    03/21/22 1551  03/22/22 0344  WBC  --  7.1  HGB 10.5* 10.3*  HCT 31.0* 29.8*  MCV  --  94.9  PLT  --  1275  Basic Metabolic Panel Recent Labs    03/21/22 1551 03/22/22 0344  NA 140 137  K 3.6 3.7  CL 107 109  CO2  --  21*  GLUCOSE 148* 122*  BUN 19 21  CREATININE 0.70 0.74  CALCIUM  --  8.8*  MG  --  1.9   Liver Function Tests No results for input(s): "AST", "ALT", "ALKPHOS", "BILITOT", "PROT", "ALBUMIN" in the last 72 hours. No results for input(s): "LIPASE", "AMYLASE" in the last 72 hours.  Cardiac Enzymes No results for input(s): "CKTOTAL", "CKMB", "CKMBINDEX", "TROPONINI" in the last 72 hours. BNP Invalid input(s): "POCBNP" D-Dimer No results for input(s): "DDIMER" in the last 72 hours. Hemoglobin A1C No results for input(s): "HGBA1C" in the last 72 hours. Fasting Lipid Panel No results for input(s): "CHOL", "HDL", "LDLCALC", "TRIG", "CHOLHDL", "LDLDIRECT" in the last 72 hours. Thyroid Function Tests No results for input(s): "TSH", "T4TOTAL", "T3FREE", "THYROIDAB" in the last 72 hours.  Invalid input(s): "FREET3" _____________  ECHOCARDIOGRAM LIMITED  Result Date: 03/21/2022    ECHOCARDIOGRAM LIMITED REPORT   Patient Name:   Tiffany Washington Date of Exam: 03/21/2022 Medical Rec #:  [id number]        Height:       64.0 in Accession #:    [id number]       Weight:       168.0 lb Date of Birth:  [birth year]         BSA:          1.817 m Patient Age:    73 years         BP:           107/60 mmHg Patient Gender: F                HR:           82 bpm. Exam Location:  Inpatient Procedure: 3D Echo, Limited Echo, Cardiac Doppler and Color Doppler Indications:     I35.0 Nonrheumatic aortic (valve) stenosis  History:         Patient has prior history of Echocardiogram examinations, most                  recent 12/05/2021. CHF, Aortic Valve Disease and Mitral Valve                  Disease; Risk Factors:Hypertension and Dyslipidemia. Severe                  aortic stenosis. Severe tricuspid  regurgitation.  Sonographer:     Roseanna Rainbow RDCS Referring Phys:  Burnell Blanks Diagnosing Phys: Jenkins Rouge MD IMPRESSIONS  1. Left ventricular ejection fraction, by estimation, is 70 to 75%. The left ventricle has hyperdynamic function. The left ventricle has no regional wall motion abnormalities. There is mild left ventricular hypertrophy.  2. Right ventricular systolic function is normal. The right ventricular size is normal. There is severely elevated pulmonary artery systolic pressure.  3. Left atrial size was mildly dilated.  4. The mitral valve is degenerative. Moderate to severe mitral valve regurgitation. Moderate mitral stenosis. Severe mitral annular calcification.  5. Pre TAVR : Severe AS mean gradient 30 peak 52 mmHg AVA 0.61 cm2 with mild AR     Post TAVR: well placed Medtronic FX valve with no perivalvular leak and mean gradient 3 peak 5 mmHg AVA 3.4 cm2. The aortic valve is tricuspid. Aortic valve regurgitation is mild. Severe aortic valve stenosis.  6. The inferior vena cava is normal in size with greater than 50% respiratory variability, suggesting right atrial pressure of 3 mmHg. FINDINGS  Left Ventricle: Left ventricular ejection fraction, by estimation, is 70 to 75%. The left ventricle has hyperdynamic function. The left ventricle has no regional wall motion abnormalities. The left ventricular internal cavity size was small. There is mild left ventricular hypertrophy. Right Ventricle: The right ventricular size is normal. No increase in right ventricular wall thickness. Right ventricular systolic function is normal. There is severely elevated  pulmonary artery systolic pressure. The tricuspid regurgitant velocity is 4.07 m/s, and with an assumed right atrial pressure of 15 mmHg, the estimated right ventricular systolic pressure is 70.6 mmHg. Left Atrium: Left atrial size was mildly dilated. Right Atrium: Right atrial size was not assessed. Pericardium: There is no evidence of  pericardial effusion. Mitral Valve: The mitral valve is degenerative in appearance. There is severe thickening of the mitral valve leaflet(s). There is severe calcification of the mitral valve leaflet(s). Severe mitral annular calcification. Moderate to severe mitral valve regurgitation. Moderate mitral valve stenosis. MV peak gradient, 37.5 mmHg. The mean mitral valve gradient is 14.0 mmHg. Tricuspid Valve: The tricuspid valve is normal in structure. Tricuspid valve regurgitation is not demonstrated. No evidence of tricuspid stenosis. Aortic Valve: Pre TAVR : Severe AS mean gradient 30 peak 52 mmHg AVA 0.61 cm2 with mild AR Post TAVR: well placed Medtronic FX valve with no perivalvular leak and mean gradient 3 peak 5 mmHg AVA 3.4 cm2. The aortic valve is tricuspid. Aortic valve regurgitation is mild. Severe aortic stenosis is present. Aortic valve mean gradient measures 3.0  mmHg. Aortic valve peak gradient measures 34.4 mmHg. Aortic valve area, by VTI measures 3.44 cm. Pulmonic Valve: The pulmonic valve was normal in structure. Pulmonic valve regurgitation is not visualized. No evidence of pulmonic stenosis. Aorta: The aortic root is normal in size and structure. Venous: The inferior vena cava is normal in size with greater than 50% respiratory variability, suggesting right atrial pressure of 3 mmHg. IAS/Shunts: The interatrial septum was not assessed. LEFT VENTRICLE PLAX 2D LVOT diam:     2.20 cm LV SV:         90 LV SV Index:   50 LVOT Area:     3.80 cm  AORTIC VALVE AV Area (Vmax):    1.45 cm AV Area (Vmean):   3.37 cm AV Area (VTI):     3.44 cm AV Vmax:           293.25 cm/s AV Vmean:          74.500 cm/s AV VTI:            0.262 m AV Peak Grad:      34.4 mmHg AV Mean Grad:      3.0 mmHg LVOT Vmax:         112.00 cm/s LVOT Vmean:        66.100 cm/s LVOT VTI:          0.237 m LVOT/AV VTI ratio: 0.90  AORTA Ao Asc diam: 3.40 cm MITRAL VALVE              TRICUSPID VALVE MV Area VTI:  1.36 cm    TR Peak  grad:   66.3 mmHg MV Peak grad: 37.5 mmHg   TR Vmax:        407.00 cm/s MV Mean grad: 14.0 mmHg MV Vmax:      3.06 m/s    SHUNTS MV Vmean:     178.0 cm/s  Systemic VTI:  0.24 m                           Systemic Diam: 2.20 cm Jenkins Rouge MD Electronically signed by Jenkins Rouge MD Signature Date/Time: 03/21/2022/4:26:35 PM    Final    Structural Heart Procedure  Result Date: 03/21/2022 See full operative report in Notes section. 26 mm Medtronic Evolut Pro THV deployed from right femoral artery.  VAS [place] LOWER EXTREMITY VENOUS (DVT) (ONLY [facility] & [facility])  Result Date: 03/19/2022  Lower Venous DVT Study Patient Name:  Tiffany Washington  Date of Exam:   03/19/2022 Medical Rec #: [id number]         Accession #:    [id number] Date of Birth: [birth year]          Patient Gender: F Patient Age:   46 years Exam Location:  [facility] Procedure:      VAS [place] LOWER EXTREMITY VENOUS (DVT) Referring Phys: Godfrey Pick --------------------------------------------------------------------------------  Indications: Swelling.  Risk Factors: None identified. Limitations: Poor ultrasound/tissue interface. Comparison Study: No prior studies. Performing Technologist: Oliver Hum RVT  Examination Guidelines: A complete evaluation includes B-mode imaging, spectral Doppler, color Doppler, and power Doppler as needed of all accessible portions of each vessel. Bilateral testing is considered an integral part of a complete examination. Limited examinations for reoccurring indications may be performed as noted. The reflux portion of the exam is performed with the patient in reverse Trendelenburg.  +---------+---------------+---------+-----------+----------+--------------+ RIGHT    CompressibilityPhasicitySpontaneityPropertiesThrombus Aging +---------+---------------+---------+-----------+----------+--------------+ CFV      Full           Yes      Yes                                  +---------+---------------+---------+-----------+----------+--------------+ SFJ      Full                                                        +---------+---------------+---------+-----------+----------+--------------+ FV Prox  Full                                                        +---------+---------------+---------+-----------+----------+--------------+ FV Mid   Full                                                        +---------+---------------+---------+-----------+----------+--------------+ FV DistalFull                                                        +---------+---------------+---------+-----------+----------+--------------+ PFV      Full                                                        +---------+---------------+---------+-----------+----------+--------------+ POP      Full           Yes      Yes                                 +---------+---------------+---------+-----------+----------+--------------+  PTV      Full                                                        +---------+---------------+---------+-----------+----------+--------------+ PERO     Full                                                        +---------+---------------+---------+-----------+----------+--------------+   +----+---------------+---------+-----------+----------+--------------+ LEFTCompressibilityPhasicitySpontaneityPropertiesThrombus Aging +----+---------------+---------+-----------+----------+--------------+ CFV Full           Yes      Yes                                 +----+---------------+---------+-----------+----------+--------------+     Summary: RIGHT: - There is no evidence of deep vein thrombosis in the lower extremity.  - No cystic structure found in the popliteal fossa.  LEFT: - No evidence of common femoral vein obstruction.  *See table(s) above for measurements and observations. Electronically signed by Jamelle Haring on  03/19/2022 at 2:57:34 PM.    Final    DG Chest 2 View  Result Date: 03/19/2022 CLINICAL DATA:  Pre-admission to transcatheter aortic valve repair, cough. EXAM: CHEST - 2 VIEW COMPARISON:  Dec 04, 2021. FINDINGS: The heart size and mediastinal contours are within normal limits. Moderate bilateral perihilar and basilar interstitial densities are noted consistent with pulmonary edema. No pneumothorax or pleural effusion is noted. Old lower thoracic compression fracture is noted. IMPRESSION: Moderate bilateral pulmonary edema is noted. Electronically Signed   By: Marijo Conception M.D.   On: 03/19/2022 09:18   DG Chest 2 View  Result Date: 03/19/2022 CLINICAL DATA:  Short of breath and weakness. EXAM: CHEST - 2 VIEW COMPARISON:  03/17/2022 FINDINGS: Stable cardiomediastinal contours. Interval development of bilateral pleural effusions, left greater than right. Mild diffuse interstitial edema pattern is similar to previous exam. No airspace opacities. IMPRESSION: Progressive CHF pattern with new bilateral pleural effusions and persistent interstitial edema. Electronically Signed   By: Kerby Moors M.D.   On: 03/19/2022 07:11    Disposition   Pt is being discharged home today in good condition.  Follow-up Plans & Appointments     Follow-up Information     Eileen Stanford, PA-C. Go on 03/29/2022.   Specialties: Cardiology, Radiology Why: @ 3:30pm, please arrive at least 10 minutes early Contact information: [place] [id number] [phone]                  Discharge Medications   Allergies as of 03/22/2022       Reactions   Carbamazepine Other (See Comments)   Doesn't remember reaction   Ciprofloxacin Other (See Comments)   Patient stated she "cannot take this"   Diclofenac Other (See Comments)   Reaction not recalled   Indomethacin Other (See Comments)   Reaction not recalled   Meloxicam Other (See Comments)   Reaction not recalled    Nortriptyline Other (See Comments)   Reaction not recalled   Voltaren [diclofenac Sodium] Other (See Comments)   Reaction not recalled   Gabapentin Other (See  Comments)   Makes the patient unable to sleep   Pamelor [nortriptyline Hcl] Other (See Comments)   insomnia   Ropinirole Other (See Comments)   Speed up the restless legs   Sulfa Antibiotics Diarrhea, Nausea And Vomiting        Medication List     STOP taking these medications    nitrofurantoin (macrocrystal-monohydrate) 100 MG capsule Commonly known as: Macrobid       TAKE these medications    acetaminophen 325 MG tablet Commonly known as: TYLENOL Take 650 mg by mouth every 6 (six) hours as needed for moderate pain or headache.   aspirin EC 81 MG tablet Take 1 tablet (81 mg total) by mouth daily. Swallow whole.   B COMPLEX 100 PO Take 1 tablet by mouth daily.   furosemide 40 MG tablet Commonly known as: Lasix Take 1 tablet (40 mg total) by mouth daily.   hydroxypropyl methylcellulose / hypromellose 2.5 % ophthalmic solution Commonly known as: ISOPTO TEARS / GONIOVISC 1 drop as needed for dry eyes.   multivitamin with minerals tablet Take 1 tablet by mouth daily.   potassium chloride SA 20 MEQ tablet Commonly known as: KLOR-CON M Take 1 tablet (20 mEq total) by mouth daily.   pramipexole 0.25 MG tablet Commonly known as: MIRAPEX Take 0.25 mg by mouth 2 (two) times daily.   rosuvastatin 20 MG tablet Commonly known as: CRESTOR Take 1 tablet (20 mg total) by mouth daily.   vitamin C 1000 MG tablet Take 1,000 mg by mouth daily.   Zinc 30 MG Caps Take 30 mg by mouth daily.          Outstanding Labs/Studies   none  Duration of Discharge Encounter   Greater than 30 minutes including physician time.  Mable Fill, PA-C 03/22/2022, 10:12 AM [phone]   Chart reviewed, patient examined, agree with above. She feels well and is ambulating.  Cardiac exam shows regular rate  and rhythm with normal valve sounds. Lungs are clear. Groin sites ok 2D echo today looks good with low mean gradient of 2 mm Hg and no paravalvular leak. Plan home today.

## 2022-03-22 NOTE — Progress Notes (Signed)
CARDIAC REHAB PHASE I   PRE:  Rate/Rhythm: 43 SR  BP:  Sitting: 97/73   SaO2: 100  MODE:  Ambulation: 200 ft   POST:  Rate/Rhythm: 75 SR  BP:  Sitting: 132/59   SaO2: 98  Pt to standing from EOB with min assist. Pt ambulated in hallway using RW and standby assist with no s/s. Pt educated on site care, restrictions, exercise guidelines, nutrition, and orientation to CRP2 referral to [place]. Pt not interested in CRP2 at this time. All questions from pt answered, and pt left on EOB to finish breakfast with call bell in reach.  [place], MS 03/22/2022 8:53 AM

## 2022-03-23 ENCOUNTER — Telehealth: Payer: Self-pay | Admitting: Physician Assistant

## 2022-03-23 NOTE — Telephone Encounter (Signed)
  [place] VALVE TEAM   Patient contacted regarding discharge from [facility] on 8/23  Patient understands to follow up with a structural heart APP on 8/30 at [place].  Patient understands discharge instructions? yes Patient understands medications and regimen? yes Patient understands to bring all medications to this visit? yes  Angelena Form PA-C  [facility]

## 2022-03-27 ENCOUNTER — Encounter (HOSPITAL_COMMUNITY): Payer: Self-pay | Admitting: *Deleted

## 2022-03-28 NOTE — Progress Notes (Unsigned)
HEART AND [place]                                     Cardiology Office Note:    Date:  03/30/2022   ID:  Tiffany Washington, DOB January 20, 1941, MRN [id number]  PCP:  Velna Hatchet, MD  [facility] Cardiologist:  Minus Breeding, MD  / Dr. Angelena Form & Dr. Cyndia Bent (TAVR) [facility] Electrophysiologist:  None   Referring MD: Velna Hatchet, MD   [facility] s/p TAVR  History of Present Illness:    Tiffany Washington is a 81 y.o. 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   The patient is followed by Dr. Percival Spanish and has a history of aortic stenosis. She was admitted in 11/2021 for acute hypoxic respiratory failure secondary to acute diastolic heart failure and severe aortic stenosis. Echocardiogram showed a hyperdynamic LV at >75% with G2DD, splay artifact with degenerative MV, moderate to severe MR, moderate MS, moderate TR, mild AI, and moderate to severe aortic stenosis which appeared to have progressed since prior echocardiogram.  Structural heart was consulted while she was admitted and she was seen by Dr. Angelena Form. L/RHC on 12/06/2021 with nonobstructive CAD, RA mean 4, PA mean 32 mmHg, PCWP mean 18 mmHg, LVEDP 15 mmHg, PVR 2.7 WU, Fick CO/CI 5.19/2.83, peak to peak aortic valve mean gradient 25 mmHg, transmitral mean gradient 11 mmHg with MV area 1.64 cm2. Pre-TAVR CTs were completed showing a small aortic annulus that sized to a 20 mm Edwards SAPIEN versus a 23 versus 26 Evolut Fx with adequate transfemoral access. She was diuresed with IV lasix and discharged home on 40 mg furosemide daily.    She was evaluated by the multidisciplinary valve team and underwent successful TAVR with a 26 mm Medtronic  Evolut FX THV via the TF approach on 03/21/22. Post operative echo showed EF 60%, normally functioning TAVR with a mean gradient of 2 mmHg and no PVL as well as severe MAC with moderate MR with splay artifact. She was discharged home on a baby aspirin.   Today the patient presents to clinic for follow up. Here with her husband. No CP or SOB. No LE edema, orthopnea or PND. No dizziness or syncope. No blood in stool or urine. No palpitations. Wonders if she can come off lasix now that her valve is fixed.   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 Angelena Form & Dr. Cyndia Bent   Spondylolisthesis of lumbar region     Past Surgical History:  Procedure Laterality Date   BACK SURGERY  10/2019   CESAREAN SECTION     COLONOSCOPY     COLONOSCOPY W/ BIOPSIES AND POLYPECTOMY     INTRAOPERATIVE TRANSTHORACIC ECHOCARDIOGRAM N/A 03/21/2022   Procedure: INTRAOPERATIVE TRANSTHORACIC ECHOCARDIOGRAM;  Surgeon: Burnell Blanks, MD;  Location: [place] [facility];  Service: Open Heart Surgery;  Laterality: N/A;   KNEE SURGERY Bilateral    Bilateral   MULTIPLE TOOTH EXTRACTIONS     POLYPECTOMY     RIGHT/LEFT HEART CATH AND CORONARY ANGIOGRAPHY N/A 12/06/2021   Procedure: RIGHT/LEFT HEART CATH AND CORONARY ANGIOGRAPHY;  Surgeon: Belva Crome, MD;  Location: [place] [facility];  Service: Cardiovascular;  Laterality: N/A;   TRANSCATHETER AORTIC VALVE REPLACEMENT, TRANSFEMORAL N/A 03/21/2022   Procedure:  Transcatheter Aortic Valve Replacement, Transfemoral;  Surgeon: Burnell Blanks, MD;  Location: [place] [facility];  Service: Open Heart Surgery;  Laterality: N/A;   UPPER GASTROINTESTINAL ENDOSCOPY     VAGINAL HYSTERECTOMY      Current Medications: Current Meds  Medication Sig   acetaminophen (TYLENOL) 325 MG tablet Take 650 mg by mouth every 6 (six) hours as needed for moderate pain or headache.   amoxicillin (AMOXIL) 500 MG tablet Take 4 tablets (2,000 mg total) by mouth as directed. 1 HOUR PRIOR TO DENTAL APPOINTMENTS   Ascorbic Acid (VITAMIN C) 1000 MG tablet Take 1,000 mg by mouth daily.   aspirin EC 81 MG tablet Take 1 tablet (81 mg total) by mouth daily. Swallow whole.   B Complex Vitamins (B COMPLEX 100 PO) Take 1 tablet by mouth daily.   hydroxypropyl methylcellulose / hypromellose (ISOPTO TEARS / GONIOVISC) 2.5 % ophthalmic solution 1 drop as needed for dry eyes.   Multiple Vitamins-Minerals (MULTIVITAMIN WITH MINERALS) tablet Take 1 tablet by mouth daily.   pramipexole (MIRAPEX) 0.25 MG tablet Take 0.25 mg by mouth 2 (two) times daily.    rosuvastatin (CRESTOR) 20 MG tablet Take 1 tablet (20 mg total) by mouth daily.   Zinc 30 MG CAPS Take 30 mg by mouth daily.   [DISCONTINUED] furosemide (LASIX) 40 MG tablet Take 1 tablet (40 mg total) by mouth daily.   [DISCONTINUED] potassium chloride SA (KLOR-CON M) 20 MEQ tablet Take 1 tablet (20 mEq total) by mouth daily.     Allergies:   Carbamazepine, Ciprofloxacin, Diclofenac, Indomethacin, Meloxicam, Nortriptyline, Voltaren [diclofenac sodium], Gabapentin, Pamelor [nortriptyline hcl], Ropinirole, and Sulfa antibiotics   Social History   Socioeconomic History   Marital status: Married    Spouse name: Karlton Lemon   Number of children: 2   Years of education: Not on file   Highest education level: High school graduate  Occupational History   Occupation: retired  Tobacco Use   Smoking status: Never   Smokeless tobacco: Never   Vaping Use   Vaping Use: Never used  Substance and Sexual Activity   Alcohol use: No   Drug use: No   Sexual activity: Not Currently  Other Topics Concern   Not on file  Social History Narrative   Working on a farm   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   She is married for 52 years and they have two children.   Social Determinants of Health   Financial Resource Strain: Not on file  Food Insecurity: No Food Insecurity (12/08/2021)   Hunger Vital Sign    Worried About Running Out of Food in the Last Year: Never true    Ran Out of Food in the Last Year: Never true  Transportation Needs: No  Transportation Needs (12/08/2021)   PRAPARE - Hydrologist (Medical): No    Lack of Transportation (Non-Medical): No  Physical Activity: Not on file  Stress: Not on file  Social Connections: Not on file     Family History: The patient's family history includes CAD (age of onset: 40) in her brother; Congestive Heart Failure in her mother; Diabetes in her brother and sister; Gout in her son; Heart failure in her mother; Hypertension in her mother; Pulmonary embolism in her father.  ROS:   Please see the history of present illness.    All other systems reviewed and are negative.  EKGs/Labs/Other Studies Reviewed:    The following studies were reviewed today:  TAVR OPERATIVE NOTE    Tiffany Washington [id number]   Date of Procedure:                 03/21/2022   Preoperative Diagnosis:      Severe Aortic Stenosis    Postoperative Diagnosis:    Same    Procedure:        Transcatheter Aortic Valve Replacement - Percutaneous Right Transfemoral Approach             Medtronic Evolut-FX  (size 26 mm, model # EVOLUTFX-26, serial # [id number])              Co-Surgeons:            Gaye Pollack, MD and Lauree Chandler, MD     Anesthesiologist:                  Elio Forget, MD   Echocardiographer:              Edmonia James, MD   Pre-operative Echo  Findings: Severe aortic stenosis   Normal left ventricular systolic function   Post-operative Echo Findings: No paravalvular leak Normal left ventricular systolic function _____________     Echo 03/22/22:  IMPRESSIONS  1. Left ventricular ejection fraction, by estimation, is 60 to 65%. The  left ventricle has normal function. The left ventricle has no regional  wall motion abnormalities. There is mild left ventricular hypertrophy.  Left ventricular diastolic parameters  are indeterminate.   2. Right ventricular systolic function is normal. The right ventricular  size is normal. There is mildly elevated pulmonary artery systolic  pressure.   3. Left atrial size was moderately dilated.   4. Right atrial size was moderately dilated.   5. Splay artifact anteriorly directed jet . The mitral valve is abnormal.  Moderate mitral valve regurgitation. No evidence of mitral stenosis.  Severe mitral annular calcification.   6. Tricuspid valve regurgitation is moderate.   7. Post TAVR with 26 mm Medtronic Evolut FX valve no PVL mean gradient 2  peak 4 mmHg AVA 2.7 cm2 DVI 0.85 . The aortic valve has been  repaired/replaced. Aortic valve regurgitation is not visualized. No aortic  stenosis is present. There is a 26 mm  CoreValve-Evolut Pro prosthetic (TAVR) valve present in the aortic  position. Procedure Date: 03/21/2022.   8. The inferior vena cava is normal in size with greater than 50%  respiratory variability, suggesting right atrial pressure of 3 mmHg.    EKG:  EKG is ordered today.  The ekg ordered today demonstrates NSR HR 73 bpm  Recent Labs: 12/06/2021: TSH 3.141 03/17/2022: ALT 24 03/19/2022: B Natriuretic Peptide 336.8 03/22/2022: BUN 21; Creatinine, Ser 0.74; Hemoglobin 10.3; Magnesium 1.9; Platelets 172; Potassium 3.7; Sodium 137  Recent Lipid Panel    Component Value Date/Time   CHOL 111 12/06/2021 0153   TRIG 107 12/06/2021 0153   HDL 38 (L) 12/06/2021 0153   CHOLHDL 2.9  12/06/2021 0153   VLDL 21 12/06/2021 0153   LDLCALC 52 12/06/2021 0153     Risk Assessment/Calculations:       Physical Exam:    VS:  BP 124/68   Pulse 75   Ht _0  (1.626 m)   Wt 169 lb (76.7 kg)   SpO2 98%   BMI 29.01 kg/m     Wt Readings from Last 3 Encounters:  03/29/22 169 lb (76.7 kg)  03/22/22 172 lb 8 oz (78.2 kg)  03/19/22 168 lb (76.2 kg)     GEN:  Well nourished, well developed in no acute distress HEENT: Normal NECK: No JVD LYMPHATICS: No lymphadenopathy CARDIAC: RRR, 3/6 holosystolic murmur at apex. no rubs, gallops RESPIRATORY:  Clear to auscultation without rales, wheezing or rhonchi  ABDOMEN: Soft, non-tender, non-distended MUSCULOSKELETAL:  No edema; No deformity  SKIN: Warm and dry NEUROLOGIC:  Alert and oriented x 3 PSYCHIATRIC:  Normal affect   ASSESSMENT:    1. S/P TAVR (transcatheter aortic valve replacement)   2. Chronic diastolic heart failure ([place])   3. Mitral valve disease   4. Coronary artery disease involving native heart without angina pectoris, unspecified vessel or lesion type   5. Primary hypertension   6. Dyslipidemia   7. Nontraumatic subcortical hemorrhage of left cerebral hemisphere [facility])    PLAN:    In order of problems listed above:  Severe AS s/p TAVR: doing great 1 week out from TAVR. Groin sites healing well. ECG with no HAVB. SBE prophylaxis discussed; I have RX'd amoxicillin. Continue home Asprin 31m daily.  I will see her back for 1 month follow and echo.   Chronic diastolic CHF: appears euvolemic. Continue furosemide 40 mg daily/Kdur 220m daily. She wanted to come off this but I think it's best she remains on it. I told her it is okay to hold on days she has a lot of errands/apts or wants to go to church.   Mitral valve disease: moderate MR on POD1 echo. Will follow on serial echos.    CAD: LHC 05/23 with 50% mid LAD, 70% D2. Continue high-intensity statin and aspirin.    HTN: well controlled. Resume home  meds.    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 Has done okay back on aspirin.    Medication Adjustments/Labs and Tests Ordered: Current medicines are reviewed at length with the patient today.  Concerns regarding medicines are outlined above.  Orders Placed This Encounter  Procedures   EKG 12-Lead   Meds ordered this encounter  Medications   amoxicillin (AMOXIL) 500 MG tablet    Sig: Take 4 tablets (2,000 mg total) by mouth as directed. 1 HOUR PRIOR TO DENTAL APPOINTMENTS    Dispense:  12 tablet    Refill:  6   potassium chloride SA (KLOR-CON M) 20 MEQ tablet    Sig: Take 1 tablet (20 mEq total) by mouth daily.    Dispense:  90 tablet    Refill:  3   furosemide (LASIX) 40 MG tablet    Sig: Take 1 tablet (40 mg total) by mouth daily.    Dispense:  90 tablet    Refill:  3    Patient Instructions  Medication Instructions:  Your physician has recommended you make  the following change in your medication:  START AMOXICILLIN 500 MG - TAKE 4 TABLETS (2000 MG) 1 HOUR PRIOR TO DENTAL CLEANINGS AND PROCEDURES    *If you need a refill on your cardiac medications before your next appointment, please call your pharmacy*   Lab Work: NONE If you have labs (blood work) drawn today and your tests are completely normal, you will receive your results only by: [place] (if you have MyChart) OR A paper copy in the mail If you have any lab test that is abnormal or we need to change your treatment, we will call you to review the results.   Testing/Procedures: NONE   Follow-Up: At [facility], you and your health needs are our priority.  As part of our continuing mission to provide you with exceptional heart care, we have created designated Provider Care Teams.  These Care Teams include your primary Cardiologist (physician) and Advanced Practice Providers (APPs -  Physician Assistants and Nurse Practitioners) who all work  together to provide you with the care you need, when you need it.  We recommend signing up for the patient portal called "MyChart".  Sign up information is provided on this After Visit Summary.  MyChart is used to connect with patients for Virtual Visits (Telemedicine).  Patients are able to view lab/test results, encounter notes, upcoming appointments, etc.  Non-urgent messages can be sent to your provider as well.   To learn more about what you can do with MyChart, go to [email or web address].    Your next appointment:   KEEP SCHEDULED FOLLOW-UP  Important Information About Sugar         Weston Brass Angelena Form, PA-C  03/30/2022 10:26 AM    [place]

## 2022-03-29 ENCOUNTER — Ambulatory Visit: Payer: PPO | Attending: Physician Assistant | Admitting: Physician Assistant

## 2022-03-29 VITALS — BP 124/68 | HR 75 | Ht 64.0 in | Wt 169.0 lb

## 2022-03-29 DIAGNOSIS — I10 Essential (primary) hypertension: Secondary | ICD-10-CM

## 2022-03-29 DIAGNOSIS — Z952 Presence of prosthetic heart valve: Secondary | ICD-10-CM

## 2022-03-29 DIAGNOSIS — I5032 Chronic diastolic (congestive) heart failure: Secondary | ICD-10-CM | POA: Diagnosis not present

## 2022-03-29 DIAGNOSIS — I2510 Atherosclerotic heart disease of native coronary artery without angina pectoris: Secondary | ICD-10-CM | POA: Diagnosis not present

## 2022-03-29 DIAGNOSIS — I610 Nontraumatic intracerebral hemorrhage in hemisphere, subcortical: Secondary | ICD-10-CM

## 2022-03-29 DIAGNOSIS — I059 Rheumatic mitral valve disease, unspecified: Secondary | ICD-10-CM

## 2022-03-29 DIAGNOSIS — E785 Hyperlipidemia, unspecified: Secondary | ICD-10-CM

## 2022-03-29 MED ORDER — POTASSIUM CHLORIDE CRYS ER 20 MEQ PO TBCR: 20.0000 meq | EXTENDED_RELEASE_TABLET | Freq: Every day | ORAL | 3 refills | Status: DC

## 2022-03-29 MED ORDER — FUROSEMIDE 40 MG PO TABS: 40.0000 mg | ORAL_TABLET | Freq: Every day | ORAL | 3 refills | Status: DC

## 2022-03-29 MED ORDER — AMOXICILLIN 500 MG PO TABS: 2000.0000 mg | ORAL_TABLET | ORAL | 6 refills | Status: DC

## 2022-03-29 NOTE — Patient Instructions (Signed)
Medication Instructions:  Your physician has recommended you make the following change in your medication:  START AMOXICILLIN 500 MG - TAKE 4 TABLETS (2000 MG) 1 HOUR PRIOR TO DENTAL CLEANINGS AND PROCEDURES    *If you need a refill on your cardiac medications before your next appointment, please call your pharmacy*   Lab Work: NONE If you have labs (blood work) drawn today and your tests are completely normal, you will receive your results only by: [place] (if you have MyChart) OR A paper copy in the mail If you have any lab test that is abnormal or we need to change your treatment, we will call you to review the results.   Testing/Procedures: NONE   Follow-Up: At [facility], you and your health needs are our priority.  As part of our continuing mission to provide you with exceptional heart care, we have created designated Provider Care Teams.  These Care Teams include your primary Cardiologist (physician) and Advanced Practice Providers (APPs -  Physician Assistants and Nurse Practitioners) who all work together to provide you with the care you need, when you need it.  We recommend signing up for the patient portal called "MyChart".  Sign up information is provided on this After Visit Summary.  MyChart is used to connect with patients for Virtual Visits (Telemedicine).  Patients are able to view lab/test results, encounter notes, upcoming appointments, etc.  Non-urgent messages can be sent to your provider as well.   To learn more about what you can do with MyChart, go to [email or web address].    Your next appointment:   KEEP SCHEDULED FOLLOW-UP  Important Information About Sugar

## 2022-03-31 ENCOUNTER — Ambulatory Visit: Payer: PPO

## 2022-04-07 DIAGNOSIS — I5032 Chronic diastolic (congestive) heart failure: Secondary | ICD-10-CM | POA: Diagnosis not present

## 2022-04-07 DIAGNOSIS — I10 Essential (primary) hypertension: Secondary | ICD-10-CM | POA: Diagnosis not present

## 2022-04-07 DIAGNOSIS — I059 Rheumatic mitral valve disease, unspecified: Secondary | ICD-10-CM | POA: Diagnosis not present

## 2022-04-07 DIAGNOSIS — E785 Hyperlipidemia, unspecified: Secondary | ICD-10-CM | POA: Diagnosis not present

## 2022-04-07 DIAGNOSIS — Z952 Presence of prosthetic heart valve: Secondary | ICD-10-CM | POA: Diagnosis not present

## 2022-04-07 DIAGNOSIS — R300 Dysuria: Secondary | ICD-10-CM | POA: Diagnosis not present

## 2022-04-07 DIAGNOSIS — Z8673 Personal history of transient ischemic attack (TIA), and cerebral infarction without residual deficits: Secondary | ICD-10-CM | POA: Diagnosis not present

## 2022-04-07 DIAGNOSIS — I2510 Atherosclerotic heart disease of native coronary artery without angina pectoris: Secondary | ICD-10-CM | POA: Diagnosis not present

## 2022-04-07 DIAGNOSIS — N390 Urinary tract infection, site not specified: Secondary | ICD-10-CM | POA: Diagnosis not present

## 2022-04-07 DIAGNOSIS — I350 Nonrheumatic aortic (valve) stenosis: Secondary | ICD-10-CM | POA: Diagnosis not present

## 2022-04-18 NOTE — Progress Notes (Addendum)
[facility] [place]                                     Cardiology Office Note:    Date:  04/19/2022   ID:  Tiffany Washington, DOB 06/07/1941, MRN [id number]  PCP:  Tiffany Hatchet, MD  [facility] Cardiologist:  Tiffany Breeding, MD/ Dr. Angelena Form, MD & Dr. Cyndia Bent, MD (TAVR) [facility] Electrophysiologist:  None   Referring MD: Tiffany Hatchet, MD   Chief Complaint  Patient presents with   Follow-up    1 month s/p TAVR   History of Present Illness:    Tiffany Washington is a 81 y.o. 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    The patient is followed by Dr. Percival Washington and has a history of aortic stenosis. She was admitted in 11/2021 for acute hypoxic respiratory failure secondary to acute diastolic heart failure and severe aortic stenosis. Echocardiogram showed a hyperdynamic LV at >75% with G2DD, splay artifact with degenerative MV, moderate to severe MR, moderate MS, moderate TR, mild AI, and moderate to severe aortic stenosis which appeared to have progressed since prior echocardiogram. Structural heart was consulted while she was admitted and she was seen by Tiffany Washington. L/RHC on 12/06/2021 with nonobstructive CAD, RA mean 4, PA mean 32 mmHg, PCWP mean 18 mmHg, LVEDP 15 mmHg, PVR 2.7 WU, Fick CO/CI 5.19/2.83, peak to peak aortic valve mean gradient 25 mmHg, transmitral mean gradient 11 mmHg with MV area 1.64 cm2. Pre-TAVR CTs were completed showing a small aortic annulus that sized to a 20 mm Edwards SAPIEN versus a 23 versus 26 Evolut Fx with adequate transfemoral access. She was diuresed with IV lasix and discharged home on 40 mg furosemide daily.    She was evaluated by the multidisciplinary  valve team and underwent successful TAVR with a 26 mm Medtronic Evolut FX THV via the TF approach on 03/21/22. Post operative echo showed EF 60%, normally functioning TAVR with a mean gradient of 2 mmHg and no PVL as well as severe MAC with moderate MR with splay artifact. She was discharged home on a baby aspirin.    She was seen in close follow up and was doing great from a CV standpoint. She continues to do very well post TAVR. She denies chest pain, LE edema, palpitations, orthopnea, dizziness, or syncope. She is tolerating medications well however is asking to come off Lasix. She is not volume overloaded.   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   s/p TAVR with a 26 mm Medtronic Evolut Fx by Tiffany Washington & Tiffany Washington   Spondylolisthesis of  lumbar region     Past Surgical History:  Procedure Laterality Date   BACK SURGERY  10/2019   CESAREAN SECTION     COLONOSCOPY     COLONOSCOPY W/ BIOPSIES AND POLYPECTOMY     INTRAOPERATIVE TRANSTHORACIC ECHOCARDIOGRAM N/A 03/21/2022   Procedure: INTRAOPERATIVE TRANSTHORACIC ECHOCARDIOGRAM;  Surgeon: Tiffany Blanks, MD;  Location: [place] [facility];  Service: Open Heart Surgery;  Laterality: N/A;   KNEE SURGERY Bilateral    Bilateral   MULTIPLE TOOTH EXTRACTIONS     POLYPECTOMY     RIGHT/LEFT HEART CATH AND CORONARY ANGIOGRAPHY N/A 12/06/2021   Procedure: RIGHT/LEFT HEART CATH AND CORONARY ANGIOGRAPHY;  Surgeon: Tiffany Crome, MD;  Location: [place] [facility];  Service:  Cardiovascular;  Laterality: N/A;   TRANSCATHETER AORTIC VALVE REPLACEMENT, TRANSFEMORAL N/A 03/21/2022   Procedure: Transcatheter Aortic Valve Replacement, Transfemoral;  Surgeon: Tiffany Blanks, MD;  Location: [place] [facility];  Service: Open Heart Surgery;  Laterality: N/A;   UPPER GASTROINTESTINAL ENDOSCOPY     VAGINAL HYSTERECTOMY      Current Medications: Current Meds  Medication Sig   acetaminophen (TYLENOL) 325 MG tablet Take 650 mg by mouth every 6 (six) hours as needed for moderate pain or headache.   amoxicillin (AMOXIL) 500 MG tablet Take 4 tablets (2,000 mg total) by mouth as directed. 1 HOUR PRIOR TO DENTAL APPOINTMENTS   Ascorbic Acid (VITAMIN C) 1000 MG tablet Take 1,000 mg by mouth daily.   aspirin EC 81 MG tablet Take 1 tablet (81 mg total) by mouth daily. Swallow whole.   B Complex Vitamins (B COMPLEX 100 PO) Take 1 tablet by mouth daily.   furosemide (LASIX) 20 MG tablet Take 1 tablet (20 mg total) by mouth daily.   hydroxypropyl methylcellulose / hypromellose (ISOPTO TEARS / GONIOVISC) 2.5 % ophthalmic solution 1 drop as needed for dry eyes.   Multiple Vitamins-Minerals (MULTIVITAMIN WITH MINERALS) tablet Take 1 tablet by mouth daily.   potassium chloride (KLOR-CON) 10 MEQ tablet Take 1 tablet (10 mEq total) by mouth daily.   pramipexole (MIRAPEX) 0.25 MG tablet Take 0.25 mg by mouth 2 (two) times daily.    rosuvastatin (CRESTOR) 20 MG tablet Take 1 tablet (20 mg total) by mouth daily.   Zinc 30 MG CAPS Take 30 mg by mouth daily.   [DISCONTINUED] furosemide (LASIX) 40 MG tablet Take 1 tablet (40 mg total) by mouth daily.   [DISCONTINUED] potassium chloride SA (KLOR-CON M) 20 MEQ tablet Take 1 tablet (20 mEq total) by mouth daily.     Allergies:   Carbamazepine, Ciprofloxacin, Diclofenac, Indomethacin, Meloxicam, Nortriptyline, Voltaren [diclofenac sodium], Gabapentin, Pamelor [nortriptyline hcl], Ropinirole, and Sulfa antibiotics   Social History    Socioeconomic History   Marital status: Married    Spouse name: Tiffany Washington   Number of children: 2   Years of education: Not on file   Highest education level: High school graduate  Occupational History   Occupation: retired  Tobacco Use   Smoking status: Never   Smokeless tobacco: Never  Vaping Use   Vaping Use: Never used  Substance and Sexual Activity   Alcohol use: No   Drug use: No   Sexual activity: Not Currently  Other Topics Concern   Not on file  Social History Narrative   Working on a farm   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   She is married for 52 years and they have two children.   Social Determinants of Health  Financial Resource Strain: Not on file  Food Insecurity: No Food Insecurity (12/08/2021)   Hunger Vital Sign    Worried About Running Out of Food in the Last Year: Never true    Ran Out of Food in the Last Year: Never true  Transportation Needs: No Transportation Needs (12/08/2021)   PRAPARE - Hydrologist (Medical): No    Lack of Transportation (Non-Medical): No  Physical Activity: Not on file  Stress: Not on file  Social Connections: Not on file     Family History: The patient's family history includes CAD (age of onset: 38) in her brother; Congestive Heart Failure in her mother; Diabetes in her brother and sister; Gout in her son; Heart failure in her mother; Hypertension in her mother; Pulmonary embolism in her father.  ROS:   Please see the history of present illness.    All other systems reviewed and are negative.  EKGs/Labs/Other Studies Reviewed:    The following studies were reviewed today:  Echocardiogram 04/19/22:   1. Left ventricular ejection fraction, by estimation, is 65 to 70%. The  left ventricle has normal function. The left ventricle has no regional  wall motion abnormalities. There is mild concentric left ventricular  hypertrophy. Left ventricular diastolic  parameters are  consistent with Grade II diastolic dysfunction  (pseudonormalization).   2. Right ventricular systolic function is normal. The right ventricular  size is normal. There is moderately elevated pulmonary artery systolic  pressure. The estimated right ventricular systolic pressure is 79.8 mmHg.   3. Left atrial size was mildly dilated.   4. The mitral valve is degenerative. At least moderate to severe mitral  valve regurgitation. MR is eccentric and anteriorly-directed, not fully  visualized. Very possibly severe. Moderate mitral stenosis. The mean  mitral valve gradient is 6.0 mmHg, MVA  by VTI 0.9 cm^2. Moderate mitral annular calcification.   5. Bioprosthetic aortic valve s/p TAVR with 26 mm Medtronic Evolut THV.  Mean gradient 4 mmHg (suspect underestimate) with EOA 2.11 cm^2. No  perivalvular leakage noted.   6. The inferior vena cava is normal in size with greater than 50%  respiratory variability, suggesting right atrial pressure of 3 mmHg.    TAVR OPERATIVE NOTE    KYRIAKI MODER [id number]   Date of Procedure:                 03/21/2022   Preoperative Diagnosis:      Severe Aortic Stenosis    Postoperative Diagnosis:    Same    Procedure:        Transcatheter Aortic Valve Replacement - Percutaneous Right Transfemoral Approach             Medtronic Evolut-FX  (size 26 mm, model # EVOLUTFX-26, serial # [id number])              Co-Surgeons:            Gaye Pollack, MD and Lauree Chandler, MD     Anesthesiologist:                  Elio Forget, MD   Echocardiographer:              Edmonia James, MD   Pre-operative Echo Findings: Severe aortic stenosis   Normal left ventricular systolic function   Post-operative Echo Findings: No paravalvular leak Normal left ventricular systolic function _____________   Echo 03/22/22:  IMPRESSIONS  1. Left ventricular ejection fraction, by estimation, is  60 to 65%. The  left ventricle has normal function. The left ventricle has  no regional  wall motion abnormalities. There is mild left ventricular hypertrophy.  Left ventricular diastolic parameters  are indeterminate.   2. Right ventricular systolic function is normal. The right ventricular  size is normal. There is mildly elevated pulmonary artery systolic  pressure.   3. Left atrial size was moderately dilated.   4. Right atrial size was moderately dilated.   5. Splay artifact anteriorly directed jet . The mitral valve is abnormal.  Moderate mitral valve regurgitation. No evidence of mitral stenosis.  Severe mitral annular calcification.   6. Tricuspid valve regurgitation is moderate.   7. Post TAVR with 26 mm Medtronic Evolut FX valve no PVL mean gradient 2  peak 4 mmHg AVA 2.7 cm2 DVI 0.85 . The aortic valve has been  repaired/replaced. Aortic valve regurgitation is not visualized. No aortic  stenosis is present. There is a 26 mm  CoreValve-Evolut Pro prosthetic (TAVR) valve present in the aortic  position. Procedure Date: 03/21/2022.   8. The inferior vena cava is normal in size with greater than 50%  respiratory variability, suggesting right atrial pressure of 3 mmHg.   EKG:  EKG is not ordered today.    Recent Labs: 12/06/2021: TSH 3.141 03/17/2022: ALT 24 03/19/2022: B Natriuretic Peptide 336.8 03/22/2022: BUN 21; Creatinine, Ser 0.74; Hemoglobin 10.3; Magnesium 1.9; Platelets 172; Potassium 3.7; Sodium 137   Recent Lipid Panel    Component Value Date/Time   CHOL 111 12/06/2021 0153   TRIG 107 12/06/2021 0153   HDL 38 (L) 12/06/2021 0153   CHOLHDL 2.9 12/06/2021 0153   VLDL 21 12/06/2021 0153   LDLCALC 52 12/06/2021 0153   Physical Exam:    VS:  BP 124/76   Pulse 66   Ht _0  (1.626 m)   Wt 171 lb (77.6 kg)   SpO2 99%   BMI 29.35 kg/m     Wt Readings from Last 3 Encounters:  04/19/22 171 lb (77.6 kg)  03/29/22 169 lb (76.7 kg)  03/22/22 172 lb 8 oz (78.2 kg)    General: Well developed, well nourished, NAD Lungs:Clear to  ausculation bilaterally. Breathing is unlabored. Cardiovascular: RRR with S1 S2. + systolic murmur Extremities: No edema. Neuro: Alert and oriented. No focal deficits. No facial asymmetry. MAE spontaneously. Psych: Responds to questions appropriately with normal affect.     [place] Cardiomyopathy Questionnaire     04/21/2022    2:36 PM 01/02/2022    1:01 PM 12/06/2021   10:39 AM  KCCQ-12  1 a. Ability to shower/bathe Not at all limited Not at all limited Quite a bit limited  1 b. Ability to walk 1 block Other, Did not do Other, Did not do Quite a bit limited  1 c. Ability to hurry/jog Other, Did not do Other, Did not do Other, Did not do  2. Edema feet/ankles/legs Never over the past 2 weeks Never over the past 2 weeks 3+ times a week, not every day  3. Limited by fatigue Never over the past 2 weeks Several times a day Several times a day  4. Limited by dyspnea Never over the past 2 weeks At least once a day Several times a day  5. Sitting up / on 3+ pillows Never over the past 2 weeks Never over the past 2 weeks 3+ times a week, not every day  6. Limited enjoyment of life Not limited at all Limited quite a  bit Limited quite a bit  7. Rest of life w/ symptoms Completely satisfied Not at all satisfied Mostly dissatisfied  8 a. Participation in hobbies Did not limit at all Limited quite a bit Moderately limited  8 b. Participation in chores Did not limit at all Slightly limited Moderately limited  8 c. Visiting family/friends Did not limit at all Did not limit at all Slightly limited    ASSESSMENT/PLAN:    Severe AS s/p TAVR: Continues to do well with NYHA Class I symptoms. Repeat echocardiogram today with normal LV function, stable Medtronic Evolut THV 32m aortic valve with mean gradient at 44mg with EOA at 2.11cm2 and no perivalvular leak. There is moderate to severe MR with an eccentric, anteriorly directed jet, mean gradient at 6.30m73m, MVA 0.9cm2. SBE prophylaxis discussed with  understanding. Continue ASA 33m27m. Plan Dr. HochPercival Spanishlow up 08/07/2022 with one year follow up with Structural Heart with repeat echo scheduled for 03/2023.   Moderate to severe MR: Previously noted to be moderate which has some progression to moderate/severe on echocardiogram today with an eccentric, anteriorly directed jet, mean gradient at 6.30mmH28mMVA 0.9cm2. She is completely asymptomatic and is scheduled to see Dr. HochrPercival Spanish24. If she develops symptoms, consider repeat echo/TEE for further evaluation. She will have a 64yr p6yrTAVR echo 03/2023 with follow up.   Chronic diastolic CHF: Appears euvolemic on exam today. Asking to reduce her Lasix today which seems appropriate. Will decrease to 230mg Q330md decrease Kdur to 130meq Q330m   CAD: LHC 05/23 with 50% mid LAD, 70% D2. Continue high-intensity statin and aspirin.    HTN: Stable with no changes needed today.    HLD: Continue rosuvastatin 20   Hx hemorrhagic CVA: Stable with no new neuro changes. Doing well on ASA 33mg.   530mication Adjustments/Labs and Tests Ordered: Current medicines are reviewed at length with the patient today.  Concerns regarding medicines are outlined above.  No orders of the defined types were placed in this encounter.  Meds ordered this encounter  Medications   furosemide (LASIX) 20 MG tablet    Sig: Take 1 tablet (20 mg total) by mouth daily.    Dispense:  90 tablet    Refill:  3   potassium chloride (KLOR-CON) 10 MEQ tablet    Sig: Take 1 tablet (10 mEq total) by mouth daily.    Dispense:  90 tablet    Refill:  3    Patient Instructions  Medication Instructions:  Your physician has recommended you make the following change in your medication:  DECREASE LASIX TO 20 MG DAILY DECREASE POTASSIUM TO 10 MEQ DAILY   *If you need a refill on your cardiac medications before your next appointment, please call your pharmacy*   Lab Work: NONE If you have labs (blood work) drawn today and your tests  are completely normal, you will receive your results only by: [facility] [place] MyChart) OR A paper copy in the mail If you have any lab test that is abnormal or we need to change your treatment, we will call you to review the results.   Testing/Procedures: NONE   Follow-Up: At [facility] your health needs are our priority.  As part of our continuing mission to provide you with exceptional heart care, we have created designated Provider Care Teams.  These Care Teams include your primary Cardiologist (physician) and Advanced Practice Providers (APPs -  Physician Assistants and Nurse Practitioners)  who all work together to provide you with the care you need, when you need it.  We recommend signing up for the patient portal called "MyChart".  Sign up information is provided on this After Visit Summary.  MyChart is used to connect with patients for Virtual Visits (Telemedicine).  Patients are able to view lab/test results, encounter notes, upcoming appointments, etc.  Non-urgent messages can be sent to your provider as well.   To learn more about what you can do with MyChart, go to [email or web address].    Your next appointment:   KEEP SCHEDULED FOLLOW-UP  Important Information About Sugar         Signed, [name], NP  04/19/2022 12:16 PM    [facility]

## 2022-04-19 ENCOUNTER — Ambulatory Visit (HOSPITAL_COMMUNITY): Payer: PPO | Attending: Cardiology | Admitting: Cardiology

## 2022-04-19 ENCOUNTER — Ambulatory Visit (HOSPITAL_BASED_OUTPATIENT_CLINIC_OR_DEPARTMENT_OTHER): Payer: PPO

## 2022-04-19 VITALS — BP 124/76 | HR 66 | Ht 64.0 in | Wt 171.0 lb

## 2022-04-19 DIAGNOSIS — I10 Essential (primary) hypertension: Secondary | ICD-10-CM | POA: Insufficient documentation

## 2022-04-19 DIAGNOSIS — I2510 Atherosclerotic heart disease of native coronary artery without angina pectoris: Secondary | ICD-10-CM | POA: Insufficient documentation

## 2022-04-19 DIAGNOSIS — I5032 Chronic diastolic (congestive) heart failure: Secondary | ICD-10-CM | POA: Insufficient documentation

## 2022-04-19 DIAGNOSIS — Z952 Presence of prosthetic heart valve: Secondary | ICD-10-CM | POA: Diagnosis not present

## 2022-04-19 DIAGNOSIS — I059 Rheumatic mitral valve disease, unspecified: Secondary | ICD-10-CM | POA: Diagnosis not present

## 2022-04-19 LAB — ECHOCARDIOGRAM COMPLETE
AR max vel: 1.86 cm2
AV Area VTI: 2.11 cm2
AV Area mean vel: 1.96 cm2
AV Mean grad: 4 mmHg
AV Peak grad: 8.2 mmHg
Ao pk vel: 1.44 m/s
Area-P 1/2: 2.29 cm2
MV M vel: 4.08 m/s
MV Peak grad: 66.6 mmHg
MV VTI: 0.91 cm2
S' Lateral: 2.3 cm

## 2022-04-19 MED ORDER — POTASSIUM CHLORIDE ER 10 MEQ PO TBCR: 10.0000 meq | EXTENDED_RELEASE_TABLET | Freq: Every day | ORAL | 3 refills | Status: DC

## 2022-04-19 MED ORDER — FUROSEMIDE 20 MG PO TABS: 20.0000 mg | ORAL_TABLET | Freq: Every day | ORAL | 3 refills | Status: DC

## 2022-04-19 NOTE — Patient Instructions (Signed)
Medication Instructions:  Your physician has recommended you make the following change in your medication:  DECREASE LASIX TO 20 MG DAILY DECREASE POTASSIUM TO 10 MEQ DAILY   *If you need a refill on your cardiac medications before your next appointment, please call your pharmacy*   Lab Work: NONE If you have labs (blood work) drawn today and your tests are completely normal, you will receive your results only by: [place] (if you have MyChart) OR A paper copy in the mail If you have any lab test that is abnormal or we need to change your treatment, we will call you to review the results.   Testing/Procedures: NONE   Follow-Up: At [facility], you and your health needs are our priority.  As part of our continuing mission to provide you with exceptional heart care, we have created designated Provider Care Teams.  These Care Teams include your primary Cardiologist (physician) and Advanced Practice Providers (APPs -  Physician Assistants and Nurse Practitioners) who all work together to provide you with the care you need, when you need it.  We recommend signing up for the patient portal called "MyChart".  Sign up information is provided on this After Visit Summary.  MyChart is used to connect with patients for Virtual Visits (Telemedicine).  Patients are able to view lab/test results, encounter notes, upcoming appointments, etc.  Non-urgent messages can be sent to your provider as well.   To learn more about what you can do with MyChart, go to [email or web address].    Your next appointment:   KEEP SCHEDULED FOLLOW-UP  Important Information About Sugar

## 2022-04-25 ENCOUNTER — Ambulatory Visit: Payer: PPO | Admitting: Cardiology

## 2022-04-25 DIAGNOSIS — Z6832 Body mass index (BMI) 32.0-32.9, adult: Secondary | ICD-10-CM | POA: Diagnosis not present

## 2022-04-25 DIAGNOSIS — Z01419 Encounter for gynecological examination (general) (routine) without abnormal findings: Secondary | ICD-10-CM | POA: Diagnosis not present

## 2022-04-25 DIAGNOSIS — Z1231 Encounter for screening mammogram for malignant neoplasm of breast: Secondary | ICD-10-CM | POA: Diagnosis not present

## 2022-04-25 DIAGNOSIS — N760 Acute vaginitis: Secondary | ICD-10-CM | POA: Diagnosis not present

## 2022-04-27 ENCOUNTER — Telehealth: Payer: Self-pay | Admitting: Cardiology

## 2022-04-27 NOTE — Telephone Encounter (Signed)
Spoke to patient echo results given.Dr.Hochrein advised to move up appointment to [facility] appointment cancelled.Appointment scheduled with Dr.Hochrein 10/23 at 10:40 am.

## 2022-04-27 NOTE — Telephone Encounter (Signed)
Patient was returning call. Please advise ?

## 2022-05-20 NOTE — Progress Notes (Unsigned)
Cardiology Office Note   Date:  05/22/2022   ID:  Tiffany Washington, DOB 04/14/1941, MRN [id number]  PCP:  Velna Hatchet, MD  Cardiologist:   Minus Breeding, MD Referring:  Velna Hatchet, MD  Chief Complaint  Patient presents with   Shortness of Breath       History of Present Illness: Tiffany Washington is a 81 y.o. female who presents for evaluation of coronary calcium.  This was noted to have coronary calcium on a CT.  This was in the LM and was three vessel.  The CT was done to evaluate abdominal pain.    She had a negative Lexiscan Myoview in 2019.    She was admitted in 11/2021 for acute hypoxic respiratory failure secondary to acute diastolic heart failure and severe aortic stenosis. 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  She has been doing relatively well since I saw her.  She does get palpitations that she notices mostly at night.  This happens probably less than once a month now.  She does feels her heart racing.  She has to get up.  It will go away.  She does not get it during the day.  She gets around slowly with a walker because of back and knee problems.  She is not having any chest pressure, neck or arm discomfort.  She is not having any new shortness of breath, PND or orthopnea.  He said no weight gain or edema.   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 Angelena Form & Dr. Cyndia Bent   Spondylolisthesis of  lumbar region     Past Surgical History:  Procedure Laterality Date   BACK SURGERY  10/2019   CESAREAN SECTION     COLONOSCOPY     COLONOSCOPY W/ BIOPSIES AND POLYPECTOMY     INTRAOPERATIVE TRANSTHORACIC ECHOCARDIOGRAM N/A 03/21/2022   Procedure: INTRAOPERATIVE TRANSTHORACIC ECHOCARDIOGRAM;  Surgeon: Burnell Blanks, MD;  Location: [place] [facility];  Service: Open Heart Surgery;  Laterality: N/A;   KNEE SURGERY Bilateral    Bilateral   MULTIPLE TOOTH EXTRACTIONS     POLYPECTOMY     RIGHT/LEFT HEART CATH AND CORONARY ANGIOGRAPHY N/A 12/06/2021   Procedure: RIGHT/LEFT HEART CATH AND CORONARY ANGIOGRAPHY;  Surgeon: Belva Crome, MD;  Location: [place] [facility];  Service: Cardiovascular;  Laterality: N/A;   TRANSCATHETER AORTIC VALVE REPLACEMENT, TRANSFEMORAL N/A 03/21/2022   Procedure: Transcatheter Aortic Valve Replacement, Transfemoral;  Surgeon: Burnell Blanks, MD;  Location: [place] [facility];  Service: Open Heart Surgery;  Laterality: N/A;   UPPER GASTROINTESTINAL ENDOSCOPY     VAGINAL HYSTERECTOMY       Current Outpatient Medications  Medication Sig Dispense Refill   acetaminophen (TYLENOL) 325 MG tablet Take 650 mg by mouth every 6 (six) hours as needed for moderate pain or  headache.     amoxicillin (AMOXIL) 500 MG tablet Take 4 tablets (2,000 mg total) by mouth as directed. 1 HOUR PRIOR TO DENTAL APPOINTMENTS 12 tablet 6   Ascorbic Acid (VITAMIN C) 1000 MG tablet Take 1,000 mg by mouth daily.     aspirin EC 81 MG tablet Take 1 tablet (81 mg total) by mouth daily. Swallow whole.     B Complex Vitamins (B COMPLEX 100 PO) Take 1 tablet by mouth daily.     furosemide (LASIX) 20 MG tablet Take 1 tablet (20 mg total) by mouth daily. 90 tablet 3   hydroxypropyl methylcellulose / hypromellose (ISOPTO TEARS / GONIOVISC) 2.5 % ophthalmic solution 1 drop as needed for dry eyes.     Multiple Vitamins-Minerals (MULTIVITAMIN WITH MINERALS) tablet Take 1 tablet by mouth  daily.     potassium chloride (KLOR-CON) 10 MEQ tablet Take 1 tablet (10 mEq total) by mouth daily. 90 tablet 3   pramipexole (MIRAPEX) 0.25 MG tablet Take 0.25 mg by mouth 2 (two) times daily.      propranolol (INDERAL) 10 MG tablet Take 1/2 tablet ( 5 mg ) at night if needed for palpitations 30 tablet 6   rosuvastatin (CRESTOR) 20 MG tablet Take 1 tablet (20 mg total) by mouth daily. 30 tablet 3   Zinc 30 MG CAPS Take 30 mg by mouth daily.     No current facility-administered medications for this visit.    Allergies:   Carbamazepine, Ciprofloxacin, Diclofenac, Indomethacin, Meloxicam, Nortriptyline, Voltaren [diclofenac sodium], Gabapentin, Pamelor [nortriptyline hcl], Ropinirole, and Sulfa antibiotics    ROS:  Please see the history of present illness.   Otherwise, review of systems are positive for none .   All other systems are reviewed and negative.    PHYSICAL EXAM: VS:  BP 115/72   Pulse 72   Ht '5\' 4"'$  (1.626 m)   Wt 170 lb (77.1 kg)   SpO2 99%   BMI 29.18 kg/m  , BMI Body mass index is 29.18 kg/m. GENERAL:  Well appearing NECK:  No jugular venous distention, waveform within normal limits, carotid upstroke brisk and symmetric, no bruits, no thyromegaly LUNGS:  Clear to auscultation bilaterally CHEST:  Unremarkable HEART:  PMI not displaced or sustained,S1 and S2 within normal limits, no S3, no S4, no clicks, no rubs, 3 out of 6 holosystolic murmur heard throughout the precordium and into the axilla, no diastolic murmurs ABD:  Flat, positive bowel sounds normal in frequency in pitch, no bruits, no rebound, no guarding, no midline pulsatile mass, no hepatomegaly, no splenomegaly EXT:  2 plus pulses throughout, no edema, no cyanosis no clubbing   EKG:  EKG is   ordered today. Sinus rhythm, rate 72, axis within normal limits, intervals within normal limits, no acute ST-T wave changes.   Recent Labs: 12/06/2021: TSH 3.141 03/17/2022: ALT 24 03/19/2022: B Natriuretic Peptide  336.8 03/22/2022: BUN 21; Creatinine, Ser 0.74; Hemoglobin 10.3; Magnesium 1.9; Platelets 172; Potassium 3.7; Sodium 137    Lipid Panel    Component Value Date/Time   CHOL 111 12/06/2021 0153   TRIG 107 12/06/2021 0153   HDL 38 (L) 12/06/2021 0153   CHOLHDL 2.9 12/06/2021 0153   VLDL 21 12/06/2021 0153   LDLCALC 52 12/06/2021 0153      Wt Readings from Last 3 Encounters:  05/22/22 170 lb (77.1 kg)  04/19/22 171 lb (77.6 kg)  03/29/22 169 lb (76.7 kg)      Other studies Reviewed: Additional studies/ records that  were reviewed today include : Echocardiogram September Review of the above records demonstrates: See elsewhere    ASSESSMENT AND PLAN:  CORONARY ARTERY CALCIFICATION:   She had some nonobstructive coronary disease and branch vessel disease in her LAD and diagonal.  She will continue with risk reduction.  TAVR:   She had stable valve replacement in September.  MR: She understands that she has moderately severe mitral regurgitation but the decision was made at the time of surgery that she was not an open chest candidate and they did not want to consider this.  I will follow this medically.    DYSLIPIDEMIA:   Her LDL was 52.  HDL 38.  No change in therapy.   HTN:   Her  BP is at target.  No change in therapy.  CVA:   She has no residual deficit from this previous event.  We will continue with risk reduction including aspirin.  PALPITATION: The patient is having these occasionally at night.  I am going to give her half of the 10 mg propranolol to take as needed.  If they increase in frequency and she would need to wear a monitor.    Current medicines are reviewed at length with the patient today.  The patient does not have concerns regarding medicines.  The following changes have been made: As above  Labs/ tests ordered today include:   Orders Placed This Encounter  Procedures   EKG 12-Lead      Disposition:   FU with me March    Signed, Minus Breeding,  MD  05/22/2022 11:33 AM    [place]

## 2022-05-22 ENCOUNTER — Encounter: Payer: Self-pay | Admitting: Cardiology

## 2022-05-22 ENCOUNTER — Ambulatory Visit: Payer: PPO | Attending: Cardiology | Admitting: Cardiology

## 2022-05-22 VITALS — BP 115/72 | HR 72 | Ht 64.0 in | Wt 170.0 lb

## 2022-05-22 DIAGNOSIS — I10 Essential (primary) hypertension: Secondary | ICD-10-CM | POA: Diagnosis not present

## 2022-05-22 DIAGNOSIS — Z952 Presence of prosthetic heart valve: Secondary | ICD-10-CM | POA: Diagnosis not present

## 2022-05-22 DIAGNOSIS — I2510 Atherosclerotic heart disease of native coronary artery without angina pectoris: Secondary | ICD-10-CM | POA: Diagnosis not present

## 2022-05-22 DIAGNOSIS — E785 Hyperlipidemia, unspecified: Secondary | ICD-10-CM | POA: Diagnosis not present

## 2022-05-22 MED ORDER — PROPRANOLOL HCL 10 MG PO TABS: ORAL_TABLET | ORAL | 6 refills | Status: DC

## 2022-05-22 NOTE — Patient Instructions (Signed)
Medication Instructions:  Start Propranolol 10 mg take 1/2 tablet at night as needed for palpitations Continue all other medications *If you need a refill on your cardiac medications before your next appointment, please call your pharmacy*   Lab Work: None ordered   Testing/Procedures: None ordered   Follow-Up: At [facility], you and your health needs are our priority.  As part of our continuing mission to provide you with exceptional heart care, we have created designated Provider Care Teams.  These Care Teams include your primary Cardiologist (physician) and Advanced Practice Providers (APPs -  Physician Assistants and Nurse Practitioners) who all work together to provide you with the care you need, when you need it.  We recommend signing up for the patient portal called "MyChart".  Sign up information is provided on this After Visit Summary.  MyChart is used to connect with patients for Virtual Visits (Telemedicine).  Patients are able to view lab/test results, encounter notes, upcoming appointments, etc.  Non-urgent messages can be sent to your provider as well.   To learn more about what you can do with MyChart, go to [email or web address].    Your next appointment:  Call in Jan to schedule March appointment     The format for your next appointment: Office   Provider: Dr.Hochrein     Important Information About Sugar

## 2022-06-02 DIAGNOSIS — H353131 Nonexudative age-related macular degeneration, bilateral, early dry stage: Secondary | ICD-10-CM | POA: Diagnosis not present

## 2022-06-02 DIAGNOSIS — H524 Presbyopia: Secondary | ICD-10-CM | POA: Diagnosis not present

## 2022-08-07 ENCOUNTER — Ambulatory Visit: Payer: PPO | Admitting: Cardiology

## 2022-08-21 DIAGNOSIS — I2510 Atherosclerotic heart disease of native coronary artery without angina pectoris: Secondary | ICD-10-CM | POA: Diagnosis not present

## 2022-08-21 DIAGNOSIS — I10 Essential (primary) hypertension: Secondary | ICD-10-CM | POA: Diagnosis not present

## 2022-08-21 DIAGNOSIS — E559 Vitamin D deficiency, unspecified: Secondary | ICD-10-CM | POA: Diagnosis not present

## 2022-08-21 DIAGNOSIS — E785 Hyperlipidemia, unspecified: Secondary | ICD-10-CM | POA: Diagnosis not present

## 2022-08-21 DIAGNOSIS — R7989 Other specified abnormal findings of blood chemistry: Secondary | ICD-10-CM | POA: Diagnosis not present

## 2022-08-28 DIAGNOSIS — I059 Rheumatic mitral valve disease, unspecified: Secondary | ICD-10-CM | POA: Diagnosis not present

## 2022-08-28 DIAGNOSIS — I10 Essential (primary) hypertension: Secondary | ICD-10-CM | POA: Diagnosis not present

## 2022-08-28 DIAGNOSIS — Z23 Encounter for immunization: Secondary | ICD-10-CM | POA: Diagnosis not present

## 2022-08-28 DIAGNOSIS — I5032 Chronic diastolic (congestive) heart failure: Secondary | ICD-10-CM | POA: Diagnosis not present

## 2022-08-28 DIAGNOSIS — Z1331 Encounter for screening for depression: Secondary | ICD-10-CM | POA: Diagnosis not present

## 2022-08-28 DIAGNOSIS — I350 Nonrheumatic aortic (valve) stenosis: Secondary | ICD-10-CM | POA: Diagnosis not present

## 2022-08-28 DIAGNOSIS — Z952 Presence of prosthetic heart valve: Secondary | ICD-10-CM | POA: Diagnosis not present

## 2022-08-28 DIAGNOSIS — Z0000 Encounter for general adult medical examination without abnormal findings: Secondary | ICD-10-CM | POA: Diagnosis not present

## 2022-08-28 DIAGNOSIS — I272 Pulmonary hypertension, unspecified: Secondary | ICD-10-CM | POA: Diagnosis not present

## 2022-08-28 DIAGNOSIS — E785 Hyperlipidemia, unspecified: Secondary | ICD-10-CM | POA: Diagnosis not present

## 2022-08-28 DIAGNOSIS — Z8673 Personal history of transient ischemic attack (TIA), and cerebral infarction without residual deficits: Secondary | ICD-10-CM | POA: Diagnosis not present

## 2022-08-28 DIAGNOSIS — I2510 Atherosclerotic heart disease of native coronary artery without angina pectoris: Secondary | ICD-10-CM | POA: Diagnosis not present

## 2022-10-11 NOTE — Progress Notes (Signed)
Cardiology Office Note   Date:  10/13/2022   ID:  Tiffany Washington, DOB 12-Oct-1940, MRN [id number]  PCP:  Velna Hatchet, MD  Cardiologist:   Minus Breeding, MD Referring:  Velna Hatchet, MD  Chief Complaint  Patient presents with   TAVR       History of Present Illness: Tiffany Washington is a 82 y.o. female who presents for evaluation of coronary calcium.  This was noted to have coronary calcium on a CT.  This was in the LM and was three vessel.  The CT was done to evaluate abdominal pain.    She had a negative Lexiscan Myoview in 2019.    She was admitted in 11/2021 for acute hypoxic respiratory failure secondary to acute diastolic heart failure and severe aortic stenosis. 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  She has been doing relatively well since I saw her.  She is riding lawnmower this weekend.  She is getting ready to do some planting another yard work.  She has had no new shortness of breath, PND or orthopnea.  She has had none of the acute complaints that she had previously.  She has had no chest or arm discomfort.  She has some rare palpitations.  I did go back and review her notes from surgery and the structural evaluation prior to her aortic valve replacement and she was not felt to be an open chest candidate for mitral valve repair.  Will have to watch this in the future but she has a severely calcified annulus and functional MR likely.  It is not clear that she would be a good percutaneous candidate either.   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 Angelena Form & Dr. Cyndia Bent   Spondylolisthesis of lumbar region     Past Surgical History:  Procedure Laterality Date   BACK SURGERY  10/2019   CESAREAN SECTION     COLONOSCOPY     COLONOSCOPY W/ BIOPSIES AND POLYPECTOMY     INTRAOPERATIVE TRANSTHORACIC ECHOCARDIOGRAM N/A 03/21/2022   Procedure: INTRAOPERATIVE TRANSTHORACIC ECHOCARDIOGRAM;  Surgeon: Burnell Blanks, MD;  Location: [place] [facility];  Service: Open Heart Surgery;  Laterality: N/A;   KNEE SURGERY Bilateral    Bilateral   MULTIPLE TOOTH EXTRACTIONS     POLYPECTOMY     RIGHT/LEFT HEART CATH AND CORONARY ANGIOGRAPHY N/A 12/06/2021   Procedure: RIGHT/LEFT HEART CATH AND CORONARY ANGIOGRAPHY;  Surgeon: Belva Crome, MD;  Location: [place] [facility];  Service: Cardiovascular;  Laterality: N/A;   TRANSCATHETER AORTIC VALVE REPLACEMENT, TRANSFEMORAL N/A 03/21/2022   Procedure: Transcatheter Aortic Valve Replacement, Transfemoral;  Surgeon: Burnell Blanks, MD;  Location: [place] [facility];  Service: Open Heart Surgery;  Laterality: N/A;   UPPER GASTROINTESTINAL ENDOSCOPY     VAGINAL HYSTERECTOMY  Current Outpatient Medications  Medication Sig Dispense Refill   acetaminophen (TYLENOL) 325 MG tablet Take 650 mg by mouth every 6 (six) hours as needed for moderate pain or headache.     amoxicillin (AMOXIL) 500 MG tablet Take 4 tablets (2,000 mg total) by mouth as directed. 1 HOUR PRIOR TO DENTAL APPOINTMENTS 12 tablet 6   Ascorbic Acid (VITAMIN C) 1000 MG tablet Take 1,000 mg by mouth daily.     aspirin EC 81 MG tablet Take 1 tablet (81 mg total) by mouth daily. Swallow whole.     B Complex Vitamins (B COMPLEX 100 PO) Take 1 tablet by mouth daily.     furosemide (LASIX) 20 MG tablet Take 1 tablet (20 mg total) by mouth daily. 90 tablet 3   hydroxypropyl  methylcellulose / hypromellose (ISOPTO TEARS / GONIOVISC) 2.5 % ophthalmic solution 1 drop as needed for dry eyes.     Multiple Vitamins-Minerals (MULTIVITAMIN WITH MINERALS) tablet Take 1 tablet by mouth daily.     potassium chloride (KLOR-CON) 10 MEQ tablet Take 1 tablet (10 mEq total) by mouth daily. 90 tablet 3   pramipexole (MIRAPEX) 0.25 MG tablet Take 0.25 mg by mouth 2 (two) times daily.      propranolol (INDERAL) 10 MG tablet Take 1/2 tablet ( 5 mg ) at night if needed for palpitations 30 tablet 6   rosuvastatin (CRESTOR) 20 MG tablet Take 1 tablet (20 mg total) by mouth daily. 30 tablet 3   Zinc 30 MG CAPS Take 30 mg by mouth daily.     No current facility-administered medications for this visit.    Allergies:   Carbamazepine, Ciprofloxacin, Diclofenac, Indomethacin, Meloxicam, Nortriptyline, Voltaren [diclofenac sodium], Gabapentin, Pamelor [nortriptyline hcl], Ropinirole, and Sulfa antibiotics    ROS:  Please see the history of present illness.   Otherwise, review of systems are positive for none.   All other systems are reviewed and negative.    PHYSICAL EXAM: VS:  BP 124/74 (BP Location: Right Arm, Patient Position: Sitting, Cuff Size: Large)   Pulse 72   Ht 5\' 4"  (1.626 m)   Wt 174 lb 6.4 oz (79.1 kg)   SpO2 94%   BMI 29.94 kg/m  , BMI Body mass index is 29.94 kg/m. GENERAL:  Well appearing NECK:  No jugular venous distention, waveform within normal limits, carotid upstroke brisk and symmetric, no bruits, no thyromegaly LUNGS:  Clear to auscultation bilaterally CHEST:  Unremarkable HEART:  PMI not displaced or sustained,S1 and S2 within normal limits, no S3, no S4, no clicks, no rubs, 3 out of 6 apical systolic murmur radiating up the aortic outflow tract and heard throughout the precordium and into the axilla, no diastolic murmurs ABD:  Flat, positive bowel sounds normal in frequency in pitch, no bruits, no rebound, no guarding, no midline pulsatile mass, no  hepatomegaly, no splenomegaly EXT:  2 plus pulses throughout, no edema, no cyanosis no clubbing   EKG:  EKG is  ordered today. Sinus rhythm, rate 72, axis within normal limits, intervals within normal limits, no acute ST-T wave changes.   Recent Labs: 12/06/2021: TSH 3.141 03/17/2022: ALT 24 03/19/2022: B Natriuretic Peptide 336.8 03/22/2022: BUN 21; Creatinine, Ser 0.74; Hemoglobin 10.3; Magnesium 1.9; Platelets 172; Potassium 3.7; Sodium 137    Lipid Panel    Component Value Date/Time   CHOL 111 12/06/2021 0153   TRIG 107 12/06/2021 0153   HDL 38 (L) 12/06/2021 0153   CHOLHDL 2.9 12/06/2021 0153   VLDL 21 12/06/2021  0153   LDLCALC 52 12/06/2021 0153      Wt Readings from Last 3 Encounters:  10/13/22 174 lb 6.4 oz (79.1 kg)  05/22/22 170 lb (77.1 kg)  04/19/22 171 lb (77.6 kg)      Other studies Reviewed: Additional studies/ records that were reviewed today include : Echocardiogram from Review of the above records demonstrates: See elsewhere    ASSESSMENT AND PLAN:  CORONARY ARTERY CALCIFICATION:   She had some nonobstructive disease and branch vessel with the LAD and diagonal involvement.  We will continue with risk reduction.    TAVR:   She had stable valve replacement in September 2023 she is due for another echocardiogram in August.  She also has follow-up with structural.    MR: As above this has been moderate to moderately severe and we are managing this medically but will be evaluated again transthoracically in August.   DYSLIPIDEMIA:   Her LDL was 58 with an HDL of 42  HTN:   Her  BP is well-controlled.  No change in therapy.  CVA:   She has had some residual numbness related to this.  PALPITATION:   These are infrequent.  She can take propranolol as needed.  Have not increased in frequency and not planning a monitor.   Current medicines are reviewed at length with the patient today.  The patient does not have concerns regarding medicines.  The following  changes have been made: None  Labs/ tests ordered today include:   Orders Placed This Encounter  Procedures   EKG 12-Lead      Disposition:   FU with me October.      Signed, Minus Breeding, MD  10/13/2022 8:56 AM    [facility]

## 2022-10-13 ENCOUNTER — Ambulatory Visit: Payer: PPO | Attending: Cardiology | Admitting: Cardiology

## 2022-10-13 ENCOUNTER — Encounter: Payer: Self-pay | Admitting: Cardiology

## 2022-10-13 VITALS — BP 124/74 | HR 72 | Ht 64.0 in | Wt 174.4 lb

## 2022-10-13 DIAGNOSIS — I2510 Atherosclerotic heart disease of native coronary artery without angina pectoris: Secondary | ICD-10-CM

## 2022-10-13 DIAGNOSIS — E785 Hyperlipidemia, unspecified: Secondary | ICD-10-CM | POA: Diagnosis not present

## 2022-10-13 DIAGNOSIS — I2584 Coronary atherosclerosis due to calcified coronary lesion: Secondary | ICD-10-CM

## 2022-10-13 DIAGNOSIS — I10 Essential (primary) hypertension: Secondary | ICD-10-CM

## 2022-10-13 DIAGNOSIS — Z952 Presence of prosthetic heart valve: Secondary | ICD-10-CM

## 2022-10-13 DIAGNOSIS — R002 Palpitations: Secondary | ICD-10-CM | POA: Diagnosis not present

## 2022-10-13 NOTE — Patient Instructions (Signed)
Medication Instructions:  Your physician recommends that you continue on your current medications as directed. Please refer to the Current Medication list given to you today.  *If you need a refill on your cardiac medications before your next appointment, please call your pharmacy*  Follow-Up: At [facility], you and your health needs are our priority.  As part of our continuing mission to provide you with exceptional heart care, we have created designated Provider Care Teams.  These Care Teams include your primary Cardiologist (physician) and Advanced Practice Providers (APPs -  Physician Assistants and Nurse Practitioners) who all work together to provide you with the care you need, when you need it.  We recommend signing up for the patient portal called "MyChart".  Sign up information is provided on this After Visit Summary.  MyChart is used to connect with patients for Virtual Visits (Telemedicine).  Patients are able to view lab/test results, encounter notes, upcoming appointments, etc.  Non-urgent messages can be sent to your provider as well.   To learn more about what you can do with MyChart, go to [email or web address].    Your next appointment:   October with Dr. Percival Spanish

## 2022-10-31 DIAGNOSIS — S22000A Wedge compression fracture of unspecified thoracic vertebra, initial encounter for closed fracture: Secondary | ICD-10-CM | POA: Diagnosis not present

## 2022-10-31 DIAGNOSIS — M546 Pain in thoracic spine: Secondary | ICD-10-CM | POA: Diagnosis not present

## 2022-10-31 DIAGNOSIS — M8000XA Age-related osteoporosis with current pathological fracture, unspecified site, initial encounter for fracture: Secondary | ICD-10-CM | POA: Diagnosis not present

## 2022-10-31 DIAGNOSIS — R600 Localized edema: Secondary | ICD-10-CM | POA: Diagnosis not present

## 2022-11-08 ENCOUNTER — Other Ambulatory Visit (HOSPITAL_COMMUNITY): Payer: Self-pay | Admitting: *Deleted

## 2022-11-10 ENCOUNTER — Ambulatory Visit (HOSPITAL_COMMUNITY)
Admission: RE | Admit: 2022-11-10 | Discharge: 2022-11-10 | Disposition: A | Discharge status: Home / Self Care | Payer: PPO | Source: Ambulatory Visit | Attending: Internal Medicine | Admitting: Internal Medicine

## 2022-11-10 DIAGNOSIS — M810 Age-related osteoporosis without current pathological fracture: Secondary | ICD-10-CM | POA: Insufficient documentation

## 2022-11-10 MED ORDER — DENOSUMAB 60 MG/ML ~~LOC~~ SOSY
PREFILLED_SYRINGE | SUBCUTANEOUS | Status: AC
  Administered 2022-11-10: 60 mg via SUBCUTANEOUS
  Filled 2022-11-10: qty 1

## 2022-11-10 MED ORDER — DENOSUMAB 60 MG/ML ~~LOC~~ SOSY: 60.0000 mg | PREFILLED_SYRINGE | Freq: Once | SUBCUTANEOUS | Status: AC

## 2022-11-13 ENCOUNTER — Other Ambulatory Visit: Payer: Self-pay | Admitting: Internal Medicine

## 2022-11-13 DIAGNOSIS — M545 Low back pain, unspecified: Secondary | ICD-10-CM

## 2022-11-14 ENCOUNTER — Other Ambulatory Visit: Payer: Self-pay | Admitting: Internal Medicine

## 2022-11-14 DIAGNOSIS — S22000A Wedge compression fracture of unspecified thoracic vertebra, initial encounter for closed fracture: Secondary | ICD-10-CM

## 2022-11-22 ENCOUNTER — Telehealth: Payer: Self-pay | Admitting: Cardiology

## 2022-11-22 NOTE — Telephone Encounter (Signed)
Patient states she was given prednisone to take for her back and since she has had swelling in both her feet and ankles. She has been wearing compression stockings, elevating her feet and taking her lasix as prescribed. She stated she even tried increasing her lasix to 40 mg one day and then she increased it to  one day but did not see a difference.  She stated its been 3 or 4 weeks now and she can not get rid of the swelling in her feet and ankles.  She denies chest pain or discomfort, shortness of breath, headache or dizziness. She states she knows prednisone can cause swelling but feels it should have gone down.   Being that patient has been wearing compression stockings, elevating her feet, and tried increasing her lasix, I offered an appointment but she would like to know what do you suggest. Please advise?

## 2022-11-22 NOTE — Telephone Encounter (Signed)
Pt c/o swelling: STAT is pt has developed SOB within 24 hours  How much weight have you gained and in what time span?  Patient states she has gained weight but she has not been monitoring her weights  If swelling, where is the swelling located?  Feet and legs  Are you currently taking a fluid pill?  Yes patient takes Lasix  Are you currently SOB?  No   Do you have a log of your daily weights (if so, list)?  No   Have you gained 3 pounds in a day or 5 pounds in a week?   Have you traveled recently?  No

## 2022-11-23 NOTE — Telephone Encounter (Signed)
Unable to reach pt or leave a message  

## 2022-11-24 NOTE — Telephone Encounter (Signed)
Left message for pt to call to schedule follow up with APP 

## 2022-11-27 DIAGNOSIS — R2242 Localized swelling, mass and lump, left lower limb: Secondary | ICD-10-CM | POA: Diagnosis not present

## 2022-11-27 DIAGNOSIS — I509 Heart failure, unspecified: Secondary | ICD-10-CM | POA: Diagnosis not present

## 2022-11-27 DIAGNOSIS — R2241 Localized swelling, mass and lump, right lower limb: Secondary | ICD-10-CM | POA: Diagnosis not present

## 2022-11-27 DIAGNOSIS — R609 Edema, unspecified: Secondary | ICD-10-CM | POA: Diagnosis not present

## 2022-11-27 NOTE — Telephone Encounter (Signed)
Follow up scheduled

## 2022-11-28 DIAGNOSIS — R600 Localized edema: Secondary | ICD-10-CM | POA: Diagnosis not present

## 2022-11-28 DIAGNOSIS — M545 Low back pain, unspecified: Secondary | ICD-10-CM | POA: Diagnosis not present

## 2022-11-28 DIAGNOSIS — S22000G Wedge compression fracture of unspecified thoracic vertebra, subsequent encounter for fracture with delayed healing: Secondary | ICD-10-CM | POA: Diagnosis not present

## 2022-11-28 NOTE — Progress Notes (Signed)
Cardiology Clinic Note   Patient Name: Tiffany Washington Date of Encounter: 12/01/2022  Primary Care Provider:  Alysia Penna, MD Primary Cardiologist:  Rollene Rotunda, MD  Patient Profile    Tiffany Washington is a 82 year old female followed by Dr. Antoine Poche with recent coronary CT scan revealing nonobstructive disease and branch vessel with LAD and diagonal involvement treated medically, status post TAVR September 2023, due for echocardiogram August 2024, MR which has been found to be moderate to moderately severe which will be again reevaluated by echo in August 2024, dyslipidemia, hypertension, history of CVA with residual numbness, infrequent palpitations.    Last seen by Dr. Antoine Poche on 10/13/2022.  Called our office on 11/22/2022 with complaints of swelling bilateral feet and ankles despite wearing compression stockings.  She increased her Lasix to 40 mg x 1 day, and then increase to the 80 mg the following day with no change in her lower extremity edema.  It was noted that she was given a prednisone injection to take for back pain.  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 Clifton James & Dr. Laneta Simmers   Spondylolisthesis of lumbar region    Past Surgical History:  Procedure Laterality Date   BACK SURGERY  10/2019   CESAREAN SECTION     COLONOSCOPY     COLONOSCOPY W/ BIOPSIES AND POLYPECTOMY     INTRAOPERATIVE TRANSTHORACIC ECHOCARDIOGRAM N/A 03/21/2022    Procedure: INTRAOPERATIVE TRANSTHORACIC ECHOCARDIOGRAM;  Surgeon: Kathleene Hazel, MD;  Location: [facility];  Service: Open Heart Surgery;  Laterality: N/A;   KNEE SURGERY Bilateral    Bilateral   MULTIPLE TOOTH EXTRACTIONS     POLYPECTOMY     RIGHT/LEFT HEART CATH AND CORONARY ANGIOGRAPHY N/A 12/06/2021   Procedure: RIGHT/LEFT HEART CATH AND CORONARY ANGIOGRAPHY;  Surgeon: Lyn Records, MD;  Location: [facility];  Service: Cardiovascular;  Laterality: N/A;   TRANSCATHETER AORTIC VALVE REPLACEMENT, TRANSFEMORAL N/A 03/21/2022   Procedure: Transcatheter Aortic Valve Replacement, Transfemoral;  Surgeon: Kathleene Hazel, MD;  Location: [facility];  Service: Open Heart Surgery;  Laterality: N/A;   UPPER GASTROINTESTINAL ENDOSCOPY     VAGINAL HYSTERECTOMY      Allergies  Allergies  Allergen Reactions   Carbamazepine Other (See Comments)    Doesn't remember reaction   Ciprofloxacin Other (See Comments)    Patient stated she "cannot take this"   Diclofenac Other (See Comments)    Reaction not recalled   Indomethacin Other (See Comments)    Reaction not recalled   Meloxicam Other (See Comments)    Reaction not recalled   Nortriptyline Other (See Comments)    Reaction not recalled   Voltaren [Diclofenac Sodium] Other (See Comments)    Reaction not recalled   Gabapentin Other (See Comments)    Makes the patient unable to sleep   Pamelor [Nortriptyline Hcl]  Other (See Comments)    insomnia   Ropinirole Other (See Comments)    Speed up the restless legs   Sulfa Antibiotics Diarrhea and Nausea And Vomiting    History of Present Illness    Tiffany Washington comes today with complaints of bilateral feet and ankle swelling after having a prednisone injection to her back for chronic back pain.  She increased her Lasix temporarily and saw no improvement in her symptoms.  She requested an appointment today for reevaluation.  She continues to have some  complaints of lower extremity edema but has improved, she is now wearing support hose, and has had diuresis since starting Lasix 40 mg twice daily over the last 2 days.  She has 2 more days to take this dose,.  She originally was on Lasix 20 mg as needed only.  She has evidence of PND but she believes is related to her back as she cannot lie flat due to back pain but she also feels fullness in her abdomen when she does so.  She states the swelling really started when she was on 6 weeks of p.o. prednisone and then had follow-up injection in her lumbar area.  She is going to have an MRI in 2 days for evaluation of compression fractures.  She is not physically active due to back pain and uses a walker for ambulation.  Home Medications    Current Outpatient Medications  Medication Sig Dispense Refill   acetaminophen (TYLENOL) 325 MG tablet Take 650 mg by mouth every 6 (six) hours as needed for moderate pain or headache.     amoxicillin (AMOXIL) 500 MG tablet Take 4 tablets (2,000 mg total) by mouth as directed. 1 HOUR PRIOR TO DENTAL APPOINTMENTS 12 tablet 6   Ascorbic Acid (VITAMIN C) 1000 MG tablet Take 1,000 mg by mouth daily.     aspirin EC 81 MG tablet Take 1 tablet (81 mg total) by mouth daily. Swallow whole.     B Complex Vitamins (B COMPLEX 100 PO) Take 1 tablet by mouth daily.     furosemide (LASIX) 20 MG tablet Take 1 tablet (20 mg total) by mouth daily. (Patient taking differently: Take 40 mg by mouth 2 (two) times daily. Patient taking 40 mg for 4 days twice a day due to swelling in legs and feet) 90 tablet 3   hydroxypropyl methylcellulose / hypromellose (ISOPTO TEARS / GONIOVISC) 2.5 % ophthalmic solution 1 drop as needed for dry eyes.     Multiple Vitamins-Minerals (MULTIVITAMIN WITH MINERALS) tablet Take 1 tablet by mouth daily.     potassium chloride (KLOR-CON) 10 MEQ tablet Take 1 tablet (10 mEq total) by mouth daily. 90 tablet 3   pramipexole (MIRAPEX) 0.25 MG tablet Take 0.25 mg by  mouth 2 (two) times daily.      propranolol (INDERAL) 10 MG tablet Take 1/2 tablet ( 5 mg ) at night if needed for palpitations 30 tablet 6   rosuvastatin (CRESTOR) 20 MG tablet Take 1 tablet (20 mg total) by mouth daily. 30 tablet 3   Zinc 30 MG CAPS Take 30 mg by mouth daily.     No current facility-administered medications for this visit.     Family History    Family History  Problem Relation Age of Onset   Hypertension Mother    Congestive Heart Failure Mother        Died, 25   Heart failure Mother    Pulmonary embolism Father  Died, 76   Diabetes Sister    CAD Brother 87       CABG   Diabetes Brother    Gout Son    She indicated that her mother is deceased. She indicated that her father is deceased. She indicated that both of her sisters are deceased. She indicated that only one of her two brothers is alive. She indicated that her maternal grandmother is deceased. She indicated that her maternal grandfather is deceased. She indicated that her paternal grandmother is deceased. She indicated that her paternal grandfather is deceased. She indicated that both of her sons are alive.  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   She is married for 52 years and they have two children.   Social Determinants of Health   Financial Resource Strain: Not on file  Food Insecurity: No Food Insecurity (12/08/2021)   Hunger Vital  Sign    Worried About Running Out of Food in the Last Year: Never true    Ran Out of Food in the Last Year: Never true  Transportation Needs: No Transportation Needs (12/08/2021)   PRAPARE - Administrator, Civil Service (Medical): No    Lack of Transportation (Non-Medical): No  Physical Activity: Not on file  Stress: Not on file  Social Connections: Not on file  Intimate Partner Violence: Not on file     Review of Systems    General:  No chills, fever, night sweats or weight changes.  Cardiovascular:  No chest pain, mild dyspnea on exertion, edema, orthopnea, palpitations, paroxysmal nocturnal dyspnea. Dermatological: No rash, lesions/masses Respiratory: No cough, dyspnea Urologic: No hematuria, dysuria Abdominal:   No nausea, vomiting, diarrhea, bright red blood per rectum, melena, or hematemesis Neurologic:  No visual changes, wkns, changes in mental status.  Hard of hearing All other systems reviewed and are otherwise negative except as noted above.     Physical Exam    VS:  BP 130/70   Pulse (!) 57   Ht 5\' 4"  (1.626 m)   Wt 176 lb 12.8 oz (80.2 kg)   SpO2 99%   BMI 30.35 kg/m  , BMI Body mass index is 30.35 kg/m.     GEN: Well nourished, well developed, in no acute distress. HEENT: normal. Neck: Supple, no JVD, carotid bruits, or masses. Cardiac: RRR, 2/6 systolic murmur heard at the apex, and left sternal border, no rubs, or gallops. No clubbing, cyanosis, 1+ edema.  Wearing compression hose radials/DP/PT 2+ and equal bilaterally.  Respiratory:  Respirations regular and unlabored, clear to auscultation bilaterally. GI: Soft, nontender, nondistended, BS + x 4. MS: no deformity or atrophy. Skin: warm and dry, no rash. Neuro:  Strength and sensation are intact.  Hard of hearing Psych: Normal affect.  Accessory Clinical Findings    ECG personally reviewed by  me today-not completed this office visit-  Lab Results  Component Value Date   WBC 7.1  03/22/2022   HGB 10.3 (L) 03/22/2022   HCT 29.8 (L) 03/22/2022   MCV 94.9 03/22/2022   PLT 172 03/22/2022   Lab Results  Component Value Date   CREATININE 0.74 03/22/2022   BUN 21 03/22/2022   NA 137 03/22/2022   K 3.7 03/22/2022   CL 109 03/22/2022   CO2 21 (L) 03/22/2022   Lab Results  Component Value Date   ALT 24 03/17/2022   AST 29 03/17/2022   ALKPHOS 69 03/17/2022   BILITOT 0.9 03/17/2022   Lab Results  Component Value Date   CHOL 111 12/06/2021   HDL 38 (L) 12/06/2021   LDLCALC 52 12/06/2021   TRIG 107 12/06/2021   CHOLHDL 2.9 12/06/2021    Lab Results  Component Value Date   HGBA1C 6.0 (H) 01/07/2021    Review of Prior Studies: Echocardiogram 04/19/2022  1. Left ventricular ejection fraction, by estimation, is 65 to 70%. The  left ventricle has normal function. The left ventricle has no regional  wall motion abnormalities. There is mild concentric left ventricular  hypertrophy. Left ventricular diastolic  parameters are consistent with Grade II diastolic dysfunction  (pseudonormalization).   2. Right ventricular systolic function is normal. The right ventricular  size is normal. There is moderately elevated pulmonary artery systolic  pressure. The estimated right ventricular systolic pressure is 49.0 mmHg.   3. Left atrial size was mildly dilated.   4. The mitral valve is degenerative. At least moderate to severe mitral  valve regurgitation. MR is eccentric and anteriorly-directed, not fully  visualized. Very possibly severe. Moderate mitral stenosis. The mean  mitral valve gradient is 6.0 mmHg, MVA  by VTI 0.9 cm^2. Moderate mitral annular calcification.   5. Bioprosthetic aortic valve s/p TAVR with 26 mm Medtronic Evolut THV.  Mean gradient 4 mmHg (suspect underestimate) with EOA 2.11 cm^2. No  perivalvular leakage noted.   6. The inferior vena cava is normal in size with greater than 50%  respiratory variability, suggesting right atrial pressure of 3  mmHg.   Coronary CTA 12/07/2021  IMPRESSION: 1. Trileaflet aortic valve with moderate calcifications (AV calcium score 1165)   2. Small aortic annulus measuring 24mm x 17mm in diameter with perimeter 64mm and area 305 mm^2. No annular or LVOT calcification. By annular area, would be appropriate for placement of 23mm Evolut valve, though by perimeter would size to a 26mm valve. Sinus of valsalva diameter would be borderline for 26mm valve however (measures 27mm at right cusp), but would be appropriate for 23mm valve. Would discuss at structural heart conference   3. Sufficient coronary to annulus distance, measuring 12mm to RCA and 15mm to left main  Right and Left Heart Cath 12/06/2021 CONCLUSIONS: Irregularities from proximal to distal LAD with 40 to 50% proximal and [place] 0, 1, 1 bifurcation in the mid vessel with 0%, 50%, and 70% stenoses. Left main is widely patent Circumflex is codominant and widely patent Right coronary is codominant with luminal irregularities Mild pulmonary hypertension, WHO group 2 with mean PA pressure 32 mmHg. Peak to peak aortic valve gradient 21 mmHg and mean gradient 25 mmHg.  Calculated aortic valve area 1.1 cm, consistent with moderate mitral stenosis. Transmitral mean gradient 11 mmHg with calculated mitral valve area 1.64 cm Cardiac output by Fick 5.19 L/min and cardiac index 2.83 L/min/m. Mean pulmonary wedge pressure 18 mmHg, LVEDP 15 mmHg, and pulmonary  vascular resistance 2.7 Wood units.   4.  Optimum Fluoroscopic Angle for Delivery:  LAO 3 CAU 6   5.  Coronary calcium score 1657 (95th percentile)  Assessment & Plan   1.  Moderate to severe mitral regurgitation: Repeating echocardiogram, evaluating for progression and need for further invasive strategies.  She is chronically short of breath, states that she cannot lie flat because of trouble breathing.  2.  Chronic lower extremity edema: Has worsened in the setting of steroid use both  p.o. and injections.  This is given due to degenerative disc disease and fractures.  Being followed by orthopedic to have MRI in 2 days.  3.  Chronic diastolic CHF: Grade 2.  Has been placed on Lasix 40 mg twice daily for the last 2 days, with some improvement in symptoms of lower extremity edema and breathing.  She has 2 more days on 40 mg twice daily.  Afterwards she will continue on Lasix 40 mg daily.  A BMET is ordered today for kidney function.  4.  History of TAVR: Followed in the valvular heart care clinic.  Most recent echocardiogram in 2023 revealed no perivalvular leakage on a Medtronic Evolution THV valve.  5.  Chronic back pain: Being followed by orthopedics MRI scheduled in 2 days.  6.  Hypertension: Currently well-controlled.Leeroy Bock. Liborio Nixon, ANP, AACC   12/01/2022 9:40 AM      Office [phone] Fax [phone]  Notice: This dictation was prepared with Dragon dictation along with smaller phrase technology. Any transcriptional errors that result from this process are unintentional and may not be corrected upon review.

## 2022-12-01 ENCOUNTER — Ambulatory Visit: Payer: PPO | Attending: Adult Health | Admitting: Adult Health

## 2022-12-01 ENCOUNTER — Encounter: Payer: Self-pay | Admitting: Adult Health

## 2022-12-01 VITALS — BP 130/70 | HR 57 | Ht 64.0 in | Wt 176.8 lb

## 2022-12-01 DIAGNOSIS — I350 Nonrheumatic aortic (valve) stenosis: Secondary | ICD-10-CM

## 2022-12-01 DIAGNOSIS — R600 Localized edema: Secondary | ICD-10-CM | POA: Diagnosis not present

## 2022-12-01 DIAGNOSIS — I10 Essential (primary) hypertension: Secondary | ICD-10-CM

## 2022-12-01 DIAGNOSIS — Z952 Presence of prosthetic heart valve: Secondary | ICD-10-CM | POA: Diagnosis not present

## 2022-12-01 DIAGNOSIS — I5032 Chronic diastolic (congestive) heart failure: Secondary | ICD-10-CM | POA: Diagnosis not present

## 2022-12-01 NOTE — Patient Instructions (Signed)
Medication Instructions:  Lasix 40 mg Daily( Starting on Sunday May 5th ). *If you need a refill on your cardiac medications before your next appointment, please call your pharmacy*   Lab Work: CMET, BNP, If you have labs (blood work) drawn today and your tests are completely normal, you will receive your results only by: MyChart Message (if you have MyChart) OR A paper copy in the mail If you have any lab test that is abnormal or we need to change your treatment, we will call you to review the results.   Testing/Procedures: [address]. Your physician has requested that you have an echocardiogram. Echocardiography is a painless test that uses sound waves to create images of your heart. It provides your doctor with information about the size and shape of your heart and how well your heart's chambers and valves are working. This procedure takes approximately one hour. There are no restrictions for this procedure. Please do NOT wear cologne, perfume, aftershave, or lotions (deodorant is allowed). Please arrive 15 minutes prior to your appointment time.    Follow-Up: At [facility], you and your health needs are our priority.  As part of our continuing mission to provide you with exceptional heart care, we have created designated Provider Care Teams.  These Care Teams include your primary Cardiologist (physician) and Advanced Practice Providers (APPs -  Physician Assistants and Nurse Practitioners) who all work together to provide you with the care you need, when you need it.  We recommend signing up for the patient portal called "MyChart".  Sign up information is provided on this After Visit Summary.  MyChart is used to connect with patients for Virtual Visits (Telemedicine).  Patients are able to view lab/test results, encounter notes, upcoming appointments, etc.  Non-urgent messages can be sent to your provider as well.   To learn more about what you can do  with MyChart, go to [email or web address].    Your next appointment:   Keep Scheduled Appointment  Provider:   Rollene Rotunda, MD

## 2022-12-02 LAB — COMPREHENSIVE METABOLIC PANEL
ALT: 17 IU/L (ref 0–32)
AST: 18 IU/L (ref 0–40)
Albumin/Globulin Ratio: 1.3 (ref 1.2–2.2)
Albumin: 3.9 g/dL (ref 3.7–4.7)
Alkaline Phosphatase: 123 IU/L — ABNORMAL HIGH (ref 44–121)
BUN/Creatinine Ratio: 25 (ref 12–28)
BUN: 23 mg/dL (ref 8–27)
Bilirubin Total: 0.7 mg/dL (ref 0.0–1.2)
CO2: 28 mmol/L (ref 20–29)
Calcium: 8.9 mg/dL (ref 8.7–10.3)
Chloride: 100 mmol/L (ref 96–106)
Creatinine, Ser: 0.92 mg/dL (ref 0.57–1.00)
Globulin, Total: 3 g/dL (ref 1.5–4.5)
Glucose: 101 mg/dL — ABNORMAL HIGH (ref 70–99)
Potassium: 3.5 mmol/L (ref 3.5–5.2)
Sodium: 141 mmol/L (ref 134–144)
Total Protein: 6.9 g/dL (ref 6.0–8.5)
eGFR: 63 mL/min/{1.73_m2} (ref >59)

## 2022-12-02 LAB — BRAIN NATRIURETIC PEPTIDE: BNP: 127.5 pg/mL — ABNORMAL HIGH (ref 0.0–100.0)

## 2022-12-03 ENCOUNTER — Ambulatory Visit
Admission: RE | Admit: 2022-12-03 | Discharge: 2022-12-03 | Disposition: A | Discharge status: Home / Self Care | Payer: PPO | Source: Ambulatory Visit | Attending: Internal Medicine | Admitting: Internal Medicine

## 2022-12-03 DIAGNOSIS — M545 Low back pain, unspecified: Secondary | ICD-10-CM | POA: Diagnosis not present

## 2022-12-03 DIAGNOSIS — S22000A Wedge compression fracture of unspecified thoracic vertebra, initial encounter for closed fracture: Secondary | ICD-10-CM

## 2022-12-03 DIAGNOSIS — M546 Pain in thoracic spine: Secondary | ICD-10-CM | POA: Diagnosis not present

## 2022-12-06 ENCOUNTER — Telehealth: Payer: Self-pay

## 2022-12-06 NOTE — Telephone Encounter (Addendum)
Called patient regarding results. Patient had understanding of results.----- Message from Jodelle Gross, NP sent at 12/04/2022  8:55 AM EDT ----- I have reviewed her labs  The BNP which is represents fluid overload is only slightly elevated. Kidney function is normal Potassium is low normal.  She is to drop down to 40 mg daily today (Monday) and continue this for now per instructions on Friday office visit..  No other changes.  KL

## 2022-12-07 ENCOUNTER — Telehealth: Payer: Self-pay | Admitting: Cardiology

## 2022-12-07 NOTE — Telephone Encounter (Signed)
Call to [employer] and spoke with customer service rep.  They need to verify DX with code.  Advised verbal given that patient has CHF DX I50.32.  She will add this to their documentation.  Nothing further needed

## 2022-12-07 NOTE — Telephone Encounter (Signed)
Shanda Bumps from [employer] is calling in regards to the patient's diagnoses. Shanda Bumps would like to confirm if the patient has been diagnosed with Congestive Heart Failure or Diabetes. Please advise.

## 2022-12-21 DIAGNOSIS — M546 Pain in thoracic spine: Secondary | ICD-10-CM | POA: Diagnosis not present

## 2022-12-21 DIAGNOSIS — S22060D Wedge compression fracture of T7-T8 vertebra, subsequent encounter for fracture with routine healing: Secondary | ICD-10-CM | POA: Diagnosis not present

## 2023-01-01 ENCOUNTER — Other Ambulatory Visit: Payer: Self-pay | Admitting: Neurosurgery

## 2023-01-01 ENCOUNTER — Telehealth: Payer: Self-pay

## 2023-01-01 NOTE — Telephone Encounter (Signed)
Patient recently saw Joni Reining, NP on 12/01/2022.  She is not back in the office for a couple of weeks so patient was contacted via telephone.  She denies chest pain, pressure, tightness.  She manages her lower extremity edema with Lasix, compression socks, and elevation.  She typically awakens with normal lower extremities that tend to get "puffier" as the day goes on.  She denies DOE.  She cannot lie flat secondary to back pain not difficulty breathing.  She has been sleeping in a recliner since back pain has flared.  She reports she is able to achieve >4.0 METS with light to moderate activities around her home and ADLs.  She is able to walk around her home with the assistance of a walker and can climb an incline or flight of stairs as long as she paces herself.  Patient is pending echo on 01/03/2023.  Providing there are no new changes on the echo the  patient should be an acceptable risk for planned surgery.  Etta Grandchild. Zaylan Kissoon, DNP, NP-C  01/01/2023, 4:01 PM [facility] [address] Office [phone] Fax [phone]

## 2023-01-01 NOTE — Telephone Encounter (Signed)
   Pre-operative Risk Assessment    Patient Name: Tiffany Washington  DOB: 09/19/1940 MRN: [id number]     Request for Surgical Clearance    Procedure: Kyphoplasty   Date of Surgery:  Clearance 01/11/23                                 Surgeon:  Leonette Most  Surgeon's Group or Practice Name:  [facility] Phone number:  [phone] Fax number:  [phone]   Type of Clearance Requested:   - Pharmacy:  Hold Aspirin     Type of Anesthesia:  General    Additional requests/questions:    SignedVernard Gambles   01/01/2023, 11:20 AM

## 2023-01-02 ENCOUNTER — Other Ambulatory Visit: Payer: Self-pay | Admitting: Neurosurgery

## 2023-01-03 ENCOUNTER — Ambulatory Visit (HOSPITAL_COMMUNITY): Payer: PPO | Attending: Cardiology

## 2023-01-03 DIAGNOSIS — I350 Nonrheumatic aortic (valve) stenosis: Secondary | ICD-10-CM | POA: Diagnosis not present

## 2023-01-03 LAB — ECHOCARDIOGRAM COMPLETE
AR max vel: 3.53 cm2
AV Area VTI: 4.28 cm2
AV Area mean vel: 3.88 cm2
AV Mean grad: 4.5 mmHg
AV Peak grad: 11 mmHg
Ao pk vel: 1.66 m/s
Area-P 1/2: 2.95 cm2
MV M vel: 4.3 m/s
MV Peak grad: 74 mmHg
S' Lateral: 2 cm

## 2023-01-03 NOTE — Progress Notes (Signed)
     Your surgery and Pre-Admission testing visit will be at [facility] located at [address], [place], [place] [address].  Please let all your doctors (i.e., Primary Care Physician, Cardiologist, Endocrinologist, Pulmonologist) know you are having surgery. You may need clearance for surgery. If you are on blood thinners, notify your surgeon and ask the doctor who prescribed them how long to hold them before surgery.  If you have had a heart test, such as an EKG, stress test, heart ultrasound, etc., or lab work performed outside of [place], please bring copies of these tests to your Pre-Admission testing, if possible.  These departments may contact you before the day of surgery:  Pre-Service Center - insurance/ billing: [phone] Pharmacy- to review your medications: [phone] Pre-Admission Testing- to set an appointment for your visit: [phone]  (Often, these numbers show up as "SPAM" on your phone)  The Pre-Admission Testing (PAT) visit focuses on Anesthesia for your upcoming surgery.  You do NOT need to fast; take your medications as usual. Please arrive 30 minutes early to allow for parking and admitting.  The visit may last up to an hour. Bring a photo ID and medical insurance card. Reschedule if you are sick. ([phone]) and please, NO children under age 16 at the visit.  During the PAT visit:  We will review your medical and surgical history.   You will receive pre-operative instructions, including the time of arrival at the hospital and surgical start time.  We will review what medication(s) you can take on the day of surgery.  After speaking with the nurse, you will have blood drawn and, if needed, a chest x-ray and EKG.  Most lab results from your doctor are good for 30 days, Hemoglobin A1C is good for 60 days. If you cannot talk to the Pharmacy, bring your medications or a list of them to the PST visit.   Infection control for the [facility] requires: All fingernail and toenail products should be removed before the day of surgery.  (SNS, Acrylic, Gel, Polish, Stickers, Press on, and Poly gel nails.)   Parking information:  Address: [facility] - [address], [place], [place] [address]  Please look for signs for entrance A off of [address]. Free valet parking is available Monday-Friday 05:30am-06:00pm     

## 2023-01-04 NOTE — Telephone Encounter (Signed)
ADDENDUM:  Echocardiogram reviewed today.  Normal heart pump function.  Moderate to severe MR and MS which is consistent with her echocardiogram September 2023.  Her TR is now moderate compared to mild.  Should not affect clearance.  Given previous phone conversation with Carlos Levering, NP would be acceptable to undergo upcoming surgery without any further cardiovascular testing.  I will epic fax updated recommendations to requesting party and removed from the pool.  Sharlene Dory, PA-C

## 2023-01-05 NOTE — Pre-Procedure Instructions (Signed)
Surgical Instructions    Your procedure is scheduled on January 11, 2023.  Report to [facility] Main Entrance "A" at 8:20 A.M., then check in with the Admitting office.  Call this number if you have problems the morning of surgery:  [phone]  If you have any questions prior to your surgery date call [phone]: Open Monday-Friday 8am-4pm If you experience any cold or flu symptoms such as cough, fever, chills, shortness of breath, etc. between now and your scheduled surgery, please notify us at the above number.     Remember:  Do not eat or drink after midnight the night before your surgery     Take these medicines the morning of surgery with A SIP OF WATER:  rosuvastatin (CRESTOR)   acetaminophen (TYLENOL) - may take if needed  hydroxypropyl methylcellulose / hypromellose (ISOPTO TEARS / GONIOVISC) - may take if needed    Follow your surgeon's instructions on when to stop Aspirin.  If no instructions were given by your surgeon then you will need to call the office to get those instructions.     As of today, STOP taking any Aleve, Naproxen, Ibuprofen, Motrin, Advil, Goody's, BC's, all herbal medications, fish oil, and all vitamins.                     Do NOT Smoke (Tobacco/Vaping) for 24 hours prior to your procedure.  If you use a CPAP at night, you may bring your mask/headgear for your overnight stay.   Contacts, glasses, piercing's, hearing aid's, dentures or partials may not be worn into surgery, please bring cases for these belongings.    For patients admitted to the hospital, discharge time will be determined by your treatment team.   Patients discharged the day of surgery will not be allowed to drive home, and someone needs to stay with them for 24 hours.  SURGICAL WAITING ROOM VISITATION Patients having surgery or a procedure may have no more than 2 support people in the waiting area - these visitors may rotate.   Children under the age of 2 must have an adult with  them who is not the patient. If the patient needs to stay at the hospital during part of their recovery, the visitor guidelines for inpatient rooms apply. Pre-op nurse will coordinate an appropriate time for 1 support person to accompany patient in pre-op.  This support person may not rotate.   Please refer to the [facility] website for the visitor guidelines for Inpatients (after your surgery is over and you are in a regular room).    If you received a COVID test during your pre-op visit  it is requested that you wear a mask when out in public, stay away from anyone that may not be feeling well and notify your surgeon if you develop symptoms. If you have been in contact with anyone that has tested positive in the last 10 days please notify you surgeon.    Pre-operative 5 CHG Bath Instructions   You can play a key role in reducing the risk of infection after surgery. Your skin needs to be as free of germs as possible. You can reduce the number of germs on your skin by washing with CHG (chlorhexidine gluconate) soap before surgery. CHG is an antiseptic soap that kills germs and continues to kill germs even after washing.   DO NOT use if you have an allergy to chlorhexidine/CHG or antibacterial soaps. If your skin becomes reddened or irritated, stop using the  CHG and notify one of our RNs at [phone].   Please shower with the CHG soap starting 4 days before surgery using the following schedule:     Please keep in mind the following:  DO NOT shave, including legs and underarms, starting the day of your first shower.   You may shave your face at any point before/day of surgery.  Place clean sheets on your bed the day you start using CHG soap. Use a clean washcloth (not used since being washed) for each shower. DO NOT sleep with pets once you start using the CHG.   CHG Shower Instructions:  If you choose to wash your hair and private area, wash first with your normal shampoo/soap.  After  you use shampoo/soap, rinse your hair and body thoroughly to remove shampoo/soap residue.  Turn the water OFF and apply about 3 tablespoons (45 ml) of CHG soap to a CLEAN washcloth.  Apply CHG soap ONLY FROM YOUR NECK DOWN TO YOUR TOES (washing for 3-5 minutes)  DO NOT use CHG soap on face, private areas, open wounds, or sores.  Pay special attention to the area where your surgery is being performed.  If you are having back surgery, having someone wash your back for you may be helpful. Wait 2 minutes after CHG soap is applied, then you may rinse off the CHG soap.  Pat dry with a clean towel  Put on clean clothes/pajamas   If you choose to wear lotion, please use ONLY the CHG-compatible lotions on the back of this paper.     Additional instructions for the day of surgery: DO NOT APPLY any lotions, deodorants, cologne, or perfumes.   Do not wear jewelry or makeup Do not wear nail polish, gel polish, artificial nails, or any other type of covering on natural nails (fingers and toes) Do not bring valuables to the hospital. [facility] is not responsible for any belongings or valuables. Put on clean/comfortable clothes.  Brush your teeth.  Ask your nurse before applying any prescription medications to the skin.      CHG Compatible Lotions   Aveeno Moisturizing lotion  Cetaphil Moisturizing Cream  Cetaphil Moisturizing Lotion  Clairol Herbal Essence Moisturizing Lotion, Dry Skin  Clairol Herbal Essence Moisturizing Lotion, Extra Dry Skin  Clairol Herbal Essence Moisturizing Lotion, Normal Skin  Curel Age Defying Therapeutic Moisturizing Lotion with Alpha Hydroxy  Curel Extreme Care Body Lotion  Curel Soothing Hands Moisturizing Hand Lotion  Curel Therapeutic Moisturizing Cream, Fragrance-Free  Curel Therapeutic Moisturizing Lotion, Fragrance-Free  Curel Therapeutic Moisturizing Lotion, Original Formula  Eucerin Daily Replenishing Lotion  Eucerin Dry Skin Therapy Plus Alpha Hydroxy  Crme  Eucerin Dry Skin Therapy Plus Alpha Hydroxy Lotion  Eucerin Original Crme  Eucerin Original Lotion  Eucerin Plus Crme Eucerin Plus Lotion  Eucerin TriLipid Replenishing Lotion  Keri Anti-Bacterial Hand Lotion  Keri Deep Conditioning Original Lotion Dry Skin Formula Softly Scented  Keri Deep Conditioning Original Lotion, Fragrance Free Sensitive Skin Formula  Keri Lotion Fast Absorbing Fragrance Free Sensitive Skin Formula  Keri Lotion Fast Absorbing Softly Scented Dry Skin Formula  Keri Original Lotion  Keri Skin Renewal Lotion Keri Silky Smooth Lotion  Keri Silky Smooth Sensitive Skin Lotion  Nivea Body Creamy Conditioning Oil  Nivea Body Extra Enriched Teacher, adult education Moisturizing Lotion Nivea Crme  Nivea Skin Firming Lotion  NutraDerm 30 Skin Lotion  NutraDerm Skin Lotion  NutraDerm Therapeutic Skin Cream  NutraDerm Therapeutic Skin Lotion  ProShield Protective Hand Cream  Provon moisturizing lotion   Please read over the following fact sheets that you were given.

## 2023-01-08 ENCOUNTER — Encounter (HOSPITAL_COMMUNITY): Payer: Self-pay

## 2023-01-08 ENCOUNTER — Other Ambulatory Visit: Payer: Self-pay

## 2023-01-08 ENCOUNTER — Encounter (HOSPITAL_COMMUNITY)
Admission: RE | Admit: 2023-01-08 | Discharge: 2023-01-08 | Disposition: A | Discharge status: Home / Self Care | Payer: PPO | Source: Ambulatory Visit | Attending: Neurosurgery | Admitting: Neurosurgery

## 2023-01-08 VITALS — BP 111/68 | HR 80 | Temp 98.3°F | Resp 18 | Ht 64.0 in | Wt 176.2 lb

## 2023-01-08 DIAGNOSIS — I69398 Other sequelae of cerebral infarction: Secondary | ICD-10-CM | POA: Diagnosis not present

## 2023-01-08 DIAGNOSIS — Z01818 Encounter for other preprocedural examination: Secondary | ICD-10-CM

## 2023-01-08 DIAGNOSIS — I5032 Chronic diastolic (congestive) heart failure: Secondary | ICD-10-CM | POA: Diagnosis not present

## 2023-01-08 DIAGNOSIS — Z01812 Encounter for preprocedural laboratory examination: Secondary | ICD-10-CM | POA: Insufficient documentation

## 2023-01-08 DIAGNOSIS — E785 Hyperlipidemia, unspecified: Secondary | ICD-10-CM | POA: Diagnosis not present

## 2023-01-08 DIAGNOSIS — I110 Hypertensive heart disease with heart failure: Secondary | ICD-10-CM | POA: Insufficient documentation

## 2023-01-08 DIAGNOSIS — I2510 Atherosclerotic heart disease of native coronary artery without angina pectoris: Secondary | ICD-10-CM | POA: Insufficient documentation

## 2023-01-08 HISTORY — DX: Dyspnea, unspecified: R06.00

## 2023-01-08 LAB — CBC
HCT: 38.5 % (ref 36.0–46.0)
Hemoglobin: 12.2 g/dL (ref 12.0–15.0)
MCH: 30.2 pg (ref 26.0–34.0)
MCHC: 31.7 g/dL (ref 30.0–36.0)
MCV: 95.3 fL (ref 80.0–100.0)
Platelets: 258 10*3/uL (ref 150–400)
RBC: 4.04 MIL/uL (ref 3.87–5.11)
RDW: 13.8 % (ref 11.5–15.5)
WBC: 7.1 10*3/uL (ref 4.0–10.5)
nRBC: 0 % (ref 0.0–0.2)

## 2023-01-08 LAB — BASIC METABOLIC PANEL
Anion gap: 10 (ref 5–15)
BUN: 18 mg/dL (ref 8–23)
CO2: 25 mmol/L (ref 22–32)
Calcium: 9.3 mg/dL (ref 8.9–10.3)
Chloride: 103 mmol/L (ref 98–111)
Creatinine, Ser: 0.86 mg/dL (ref 0.44–1.00)
GFR, Estimated: >60 mL/min (ref >60)
Glucose, Bld: 107 mg/dL — ABNORMAL HIGH (ref 70–99)
Potassium: 3.8 mmol/L (ref 3.5–5.1)
Sodium: 138 mmol/L (ref 135–145)

## 2023-01-08 LAB — SURGICAL PCR SCREEN
MRSA, PCR: NEGATIVE
Staphylococcus aureus: NEGATIVE

## 2023-01-08 NOTE — Progress Notes (Signed)
PCP - Dr. Alysia Penna Cardiologist - Dr. Rollene Rotunda  PPM/ICD - Denies Device Orders - n/a Rep Notified - n/a  Chest x-ray - 03/19/2022 EKG - 10/13/2022 Stress Test - 07/18/2018 ECHO - 01/03/2023 Cardiac Cath - 03/21/2022  Sleep Study - Denies CPAP - n/a  No DM  Last dose of GLP1 agonist- n/a GLP1 instructions: n/a  Blood Thinner Instructions: n/a Aspirin Instructions: Pt last dose of ASA was June 7th  NPO after midnight  COVID TEST- n/a   Anesthesia review: Yes. Cardiac Clearance obtained  Patient denies shortness of breath, fever, cough and chest pain at PAT appointment. Pt denies any respiratory illness/infection in the last two months.   All instructions explained to the patient, with a verbal understanding of the material. Patient agrees to go over the instructions while at home for a better understanding. Patient also instructed to self quarantine after being tested for COVID-19. The opportunity to ask questions was provided.

## 2023-01-09 NOTE — Anesthesia Preprocedure Evaluation (Signed)
Anesthesia Evaluation  Patient identified by MRN, date of birth, ID band Patient awake    Reviewed: Allergy & Precautions, NPO status , Patient's Chart, lab work & pertinent test results  Airway Mallampati: II  TM Distance: >3 FB Neck ROM: Full    Dental  (+) Missing, Edentulous Upper,    Pulmonary neg pulmonary ROS   Pulmonary exam normal breath sounds clear to auscultation       Cardiovascular METS: 3 - Mets hypertension (131/66 preop), +CHF  Normal cardiovascular exam+ Valvular Problems/Murmurs (mod- severe MR and mod-severe MS, normal prosthetic AVR on last echo June 2024. s/p TAVR 2023) MR  Rhythm:Regular Rate:Normal  Echo 12/2022 1. Left ventricular ejection fraction, by estimation, is 55 to 60%. The  left ventricle has normal function. The left ventricle has no regional  wall motion abnormalities. There is mild concentric left ventricular  hypertrophy. Left ventricular diastolic  function could not be evaluated.   2. Right ventricular systolic function is normal. The right ventricular  size is normal.   3. Left atrial size was moderately dilated.   4. Right atrial size was moderately dilated.   5. The mitral valve is degenerative. Moderate to severe mitral valve  regurgitation. Moderate to severe mitral stenosis. The mean mitral valve  gradient is 10.0 mmHg. Severe mitral annular calcification.   6. Tricuspid valve regurgitation is moderate.   7. Well-seated bioprosthetic aortic valve. Aortic valve regurgitation is  not visualized. No aortic stenosis is present. Aortic valve mean gradient  measures 4.5 mmHg. Aortic valve Vmax measures 1.66 m/s.   8. There is borderline dilatation of the ascending aorta, measuring 37  mm.   9. The inferior vena cava is normal in size with greater than 50%  respiratory variability, suggesting right atrial pressure of 3 mmHg.    Right/left heart cath 12/06/2021 (pre-TAVR):             Irregularities from proximal to distal LAD with 40 to 50% proximal and [place] 0, 1, 1 bifurcation in the mid vessel with 0%, 50%, and 70% stenoses.            Left main is widely patent            Circumflex is codominant and widely patent            Right coronary is codominant with luminal irregularities            Mild pulmonary hypertension, WHO group 2 with mean PA pressure 32 mmHg.            Peak to peak aortic valve gradient 21 mmHg and mean gradient 25 mmHg.  Calculated aortic valve area 1.1 cm, consistent with moderate mitral stenosis.            Transmitral mean gradient 11 mmHg with calculated mitral valve area 1.64 cm            Cardiac output by Fick 5.19 L/min and cardiac index 2.83 L/min/m.            Mean pulmonary wedge pressure 18 mmHg, LVEDP 15 mmHg, and pulmonary vascular resistance 2.7 Wood units.   RECOMMENDATIONS:            Compensated heart failure with patient able to lie flat.  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            Structural heart team evaluating the patient for possible valve procedures.  Neuro/Psych CVA (residual R hand numbness, 2022), Residual Symptoms  negative psych ROS   GI/Hepatic Neg liver ROS,GERD  Controlled,,  Endo/Other  negative endocrine ROS    Renal/GU negative Renal ROS  negative genitourinary   Musculoskeletal  (+) Arthritis , Osteoarthritis,  T8 compression fx with poor healing   Abdominal   Peds  Hematology negative hematology ROS (+) Hb 12.2   Anesthesia Other Findings HOH  Reproductive/Obstetrics negative OB ROS                             Anesthesia Physical Anesthesia Plan  ASA: 4  Anesthesia Plan: General   Post-op Pain Management: Tylenol PO (pre-op)*   Induction: Intravenous  PONV Risk Score and Plan: 3 and Ondansetron, Dexamethasone and Treatment may vary due to age or medical condition  Airway Management Planned:  Oral ETT  Additional Equipment: None and ClearSight  Intra-op Plan:   Post-operative Plan: Extubation in OR  Informed Consent: I have reviewed the patients History and Physical, chart, labs and discussed the procedure including the risks, benefits and alternatives for the proposed anesthesia with the patient or authorized representative who has indicated his/her understanding and acceptance.     Dental advisory given  Plan Discussed with: CRNA  Anesthesia Plan Comments: ( )        Anesthesia Quick Evaluation

## 2023-01-09 NOTE — Progress Notes (Signed)
Anesthesia Chart Review:  Follows cardiology for history of nonobstructive CAD, severe AS s/p TAVR September 2023, HFpEF, moderate-severe mitral regurgitation and mitral stenosis, HTN, HLD, CVA with residual numbness, chronic DOE, chronic lower extremity edema, palpitations.  She was contacted by Henreitta Leber, NP on 01/01/2023 for preop cardiovascular evaluation.  Per note, "Patient recently saw Joni Reining, NP on 12/01/2022.  She is not back in the office for a couple of weeks so patient was contacted via telephone.  She denies chest pain, pressure, tightness.  She manages her lower extremity edema with Lasix, compression socks, and elevation.  She typically awakens with normal lower extremities that tend to get "puffier" as the day goes on.  She denies DOE.  She cannot lie flat secondary to back pain not difficulty breathing.  She has been sleeping in a recliner since back pain has flared.  She reports she is able to achieve >4.0 METS with light to moderate activities around her home and ADLs.  She is able to walk around her home with the assistance of a walker and can climb an incline or flight of stairs as long as she paces herself.  Patient is pending echo on 01/03/2023.  Providing there are no new changes on the echo the  patient should be an acceptable risk for planned surgery."  This was followed by telephone encounter by Jari Favre, PA-C on 01/04/2023 stating, "Echocardiogram reviewed today.  Normal heart pump function.  Moderate to severe MR and MS which is consistent with her echocardiogram September 2023.  Her TR is now moderate compared to mild.  Should not affect clearance. Given previous phone conversation with Carlos Levering, NP would be acceptable to undergo upcoming surgery without any further cardiovascular testing."  Preop labs reviewed, WNL.  EKG 10/13/2022: NSR.  Rate 72.  TTE 01/03/2023:  1. Left ventricular ejection fraction, by estimation, is 55 to 60%. The  left ventricle has  normal function. The left ventricle has no regional  wall motion abnormalities. There is mild concentric left ventricular  hypertrophy. Left ventricular diastolic  function could not be evaluated.   2. Right ventricular systolic function is normal. The right ventricular  size is normal.   3. Left atrial size was moderately dilated.   4. Right atrial size was moderately dilated.   5. The mitral valve is degenerative. Moderate to severe mitral valve  regurgitation. Moderate to severe mitral stenosis. The mean mitral valve  gradient is 10.0 mmHg. Severe mitral annular calcification.   6. Tricuspid valve regurgitation is moderate.   7. Well-seated bioprosthetic aortic valve. Aortic valve regurgitation is  not visualized. No aortic stenosis is present. Aortic valve mean gradient  measures 4.5 mmHg. Aortic valve Vmax measures 1.66 m/s.   8. There is borderline dilatation of the ascending aorta, measuring 37  mm.   9. The inferior vena cava is normal in size with greater than 50%  respiratory variability, suggesting right atrial pressure of 3 mmHg.   Right/left heart cath 12/06/2021: ONCLUSIONS:  Irregularities from proximal to distal LAD with 40 to 50% proximal and Medina 0, 1, 1 bifurcation in the mid vessel with 0%, 50%, and 70% stenoses.  Left main is widely patent  Circumflex is codominant and widely patent  Right coronary is codominant with luminal irregularities  Mild pulmonary hypertension, WHO group 2 with mean PA pressure 32 mmHg.  Peak to peak aortic valve gradient 21 mmHg and mean gradient 25 mmHg.  Calculated aortic valve area 1.1 cm, consistent with moderate  mitral stenosis.  Transmitral mean gradient 11 mmHg with calculated mitral valve area 1.64 cm  Cardiac output by Fick 5.19 L/min and cardiac index 2.83 L/min/m.  Mean pulmonary wedge pressure 18 mmHg, LVEDP 15 mmHg, and pulmonary vascular resistance 2.7 Wood units.   RECOMMENDATIONS:  Compensated heart failure  with patient able to lie flat.  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  Structural heart team evaluating the patient for possible valve procedures.    Zannie Cove [facility] Short Stay Center/Anesthesiology Phone [phone] 01/09/2023 12:09 PM

## 2023-01-11 ENCOUNTER — Ambulatory Visit (HOSPITAL_BASED_OUTPATIENT_CLINIC_OR_DEPARTMENT_OTHER): Payer: PPO

## 2023-01-11 ENCOUNTER — Encounter (HOSPITAL_COMMUNITY): Payer: Self-pay | Admitting: Neurosurgery

## 2023-01-11 ENCOUNTER — Other Ambulatory Visit: Payer: Self-pay

## 2023-01-11 ENCOUNTER — Encounter (HOSPITAL_COMMUNITY)
Admission: RE | Disposition: A | Discharge status: Home / Self Care | Payer: Self-pay | Source: Ambulatory Visit | Attending: Neurosurgery

## 2023-01-11 ENCOUNTER — Ambulatory Visit (HOSPITAL_COMMUNITY): Payer: PPO

## 2023-01-11 ENCOUNTER — Ambulatory Visit (HOSPITAL_COMMUNITY): Payer: PPO | Admitting: Physician Assistant

## 2023-01-11 ENCOUNTER — Ambulatory Visit (HOSPITAL_COMMUNITY)
Admission: RE | Admit: 2023-01-11 | Discharge: 2023-01-11 | Disposition: A | Discharge status: Home / Self Care | Payer: PPO | Source: Ambulatory Visit | Attending: Neurosurgery | Admitting: Neurosurgery

## 2023-01-11 DIAGNOSIS — I340 Nonrheumatic mitral (valve) insufficiency: Secondary | ICD-10-CM | POA: Diagnosis not present

## 2023-01-11 DIAGNOSIS — I5032 Chronic diastolic (congestive) heart failure: Secondary | ICD-10-CM | POA: Diagnosis not present

## 2023-01-11 DIAGNOSIS — Z952 Presence of prosthetic heart valve: Secondary | ICD-10-CM | POA: Diagnosis not present

## 2023-01-11 DIAGNOSIS — M4854XA Collapsed vertebra, not elsewhere classified, thoracic region, initial encounter for fracture: Secondary | ICD-10-CM

## 2023-01-11 DIAGNOSIS — I110 Hypertensive heart disease with heart failure: Secondary | ICD-10-CM

## 2023-01-11 DIAGNOSIS — M8008XA Age-related osteoporosis with current pathological fracture, vertebra(e), initial encounter for fracture: Secondary | ICD-10-CM | POA: Diagnosis not present

## 2023-01-11 DIAGNOSIS — I69998 Other sequelae following unspecified cerebrovascular disease: Secondary | ICD-10-CM | POA: Insufficient documentation

## 2023-01-11 DIAGNOSIS — M8088XA Other osteoporosis with current pathological fracture, vertebra(e), initial encounter for fracture: Secondary | ICD-10-CM | POA: Diagnosis not present

## 2023-01-11 DIAGNOSIS — S22000G Wedge compression fracture of unspecified thoracic vertebra, subsequent encounter for fracture with delayed healing: Secondary | ICD-10-CM

## 2023-01-11 DIAGNOSIS — I509 Heart failure, unspecified: Secondary | ICD-10-CM | POA: Diagnosis not present

## 2023-01-11 DIAGNOSIS — I052 Rheumatic mitral stenosis with insufficiency: Secondary | ICD-10-CM | POA: Diagnosis not present

## 2023-01-11 DIAGNOSIS — Z0389 Encounter for observation for other suspected diseases and conditions ruled out: Secondary | ICD-10-CM | POA: Diagnosis not present

## 2023-01-11 HISTORY — PX: KYPHOPLASTY: SHX5884

## 2023-01-11 SURGERY — KYPHOPLASTY
Anesthesia: General | Site: Spine Thoracic

## 2023-01-11 MED ORDER — CHLORHEXIDINE GLUCONATE 0.12 % MT SOLN
15.0000 mL | Freq: Once | OROMUCOSAL | Status: AC
  Administered 2023-01-11: 15 mL via OROMUCOSAL
  Filled 2023-01-11: qty 15

## 2023-01-11 MED ORDER — ROSUVASTATIN CALCIUM 20 MG PO TABS: 20.0000 mg | ORAL_TABLET | Freq: Every day | ORAL | Status: DC

## 2023-01-11 MED ORDER — CEFAZOLIN SODIUM-DEXTROSE 2-4 GM/100ML-% IV SOLN
2.0000 g | INTRAVENOUS | Status: AC
  Administered 2023-01-11: 2 g via INTRAVENOUS
  Filled 2023-01-11: qty 100

## 2023-01-11 MED ORDER — ONDANSETRON HCL 4 MG/2ML IJ SOLN: 4.0000 mg | Freq: Once | INTRAMUSCULAR | Status: DC | PRN

## 2023-01-11 MED ORDER — AMISULPRIDE (ANTIEMETIC) 5 MG/2ML IV SOLN: 10.0000 mg | Freq: Once | INTRAVENOUS | Status: DC | PRN

## 2023-01-11 MED ORDER — ROCURONIUM BROMIDE 10 MG/ML (PF) SYRINGE
PREFILLED_SYRINGE | INTRAVENOUS | Status: DC | PRN
  Administered 2023-01-11: 70 mg via INTRAVENOUS

## 2023-01-11 MED ORDER — MENTHOL 3 MG MT LOZG: 1.0000 | LOZENGE | OROMUCOSAL | Status: DC | PRN

## 2023-01-11 MED ORDER — PHENOL 1.4 % MT LIQD: 1.0000 | OROMUCOSAL | Status: DC | PRN

## 2023-01-11 MED ORDER — MORPHINE SULFATE (PF) 4 MG/ML IV SOLN: 4.0000 mg | INTRAVENOUS | Status: DC | PRN

## 2023-01-11 MED ORDER — CYCLOBENZAPRINE HCL 10 MG PO TABS: 10.0000 mg | ORAL_TABLET | Freq: Three times a day (TID) | ORAL | Status: DC | PRN

## 2023-01-11 MED ORDER — SODIUM CHLORIDE 0.9 % IV SOLN: 250.0000 mL | INTRAVENOUS | Status: DC

## 2023-01-11 MED ORDER — PHENYLEPHRINE HCL (PRESSORS) 10 MG/ML IV SOLN
INTRAVENOUS | Status: DC | PRN
  Administered 2023-01-11: 160 ug via INTRAVENOUS
  Administered 2023-01-11: 80 ug via INTRAVENOUS
  Administered 2023-01-11: 160 ug via INTRAVENOUS

## 2023-01-11 MED ORDER — IOPAMIDOL (ISOVUE-300) INJECTION 61%
INTRAVENOUS | Status: DC | PRN
  Administered 2023-01-11: 100 mL

## 2023-01-11 MED ORDER — DOCUSATE SODIUM 100 MG PO CAPS: 100.0000 mg | ORAL_CAPSULE | Freq: Two times a day (BID) | ORAL | Status: DC

## 2023-01-11 MED ORDER — ONDANSETRON HCL 4 MG PO TABS: 4.0000 mg | ORAL_TABLET | Freq: Four times a day (QID) | ORAL | Status: DC | PRN

## 2023-01-11 MED ORDER — LACTATED RINGERS IV SOLN: INTRAVENOUS | Status: DC

## 2023-01-11 MED ORDER — ACETAMINOPHEN 650 MG RE SUPP: 650.0000 mg | RECTAL | Status: DC | PRN

## 2023-01-11 MED ORDER — ONDANSETRON HCL 4 MG/2ML IJ SOLN
INTRAMUSCULAR | Status: DC | PRN
  Administered 2023-01-11: 4 mg via INTRAVENOUS

## 2023-01-11 MED ORDER — OXYCODONE HCL 5 MG PO TABS: 5.0000 mg | ORAL_TABLET | Freq: Once | ORAL | Status: DC | PRN

## 2023-01-11 MED ORDER — OXYCODONE HCL 5 MG/5ML PO SOLN: 5.0000 mg | Freq: Once | ORAL | Status: DC | PRN

## 2023-01-11 MED ORDER — CHLORHEXIDINE GLUCONATE CLOTH 2 % EX PADS: 6.0000 | MEDICATED_PAD | Freq: Once | CUTANEOUS | Status: DC

## 2023-01-11 MED ORDER — SODIUM CHLORIDE 0.9% FLUSH: 3.0000 mL | Freq: Two times a day (BID) | INTRAVENOUS | Status: DC

## 2023-01-11 MED ORDER — BACITRACIN ZINC 500 UNIT/GM EX OINT
TOPICAL_OINTMENT | CUTANEOUS | Status: DC | PRN
  Administered 2023-01-11: 1 via TOPICAL

## 2023-01-11 MED ORDER — BUPIVACAINE-EPINEPHRINE 0.5% -1:200000 IJ SOLN
INTRAMUSCULAR | Status: DC | PRN
  Administered 2023-01-11: 10 mL

## 2023-01-11 MED ORDER — PROPOFOL 10 MG/ML IV BOLUS
INTRAVENOUS | Status: AC
  Filled 2023-01-11: qty 20

## 2023-01-11 MED ORDER — FUROSEMIDE 40 MG PO TABS: 40.0000 mg | ORAL_TABLET | Freq: Every day | ORAL | Status: DC

## 2023-01-11 MED ORDER — DEXAMETHASONE SODIUM PHOSPHATE 10 MG/ML IJ SOLN
INTRAMUSCULAR | Status: DC | PRN
  Administered 2023-01-11: 10 mg via INTRAVENOUS

## 2023-01-11 MED ORDER — FENTANYL CITRATE (PF) 100 MCG/2ML IJ SOLN
25.0000 ug | INTRAMUSCULAR | Status: DC | PRN
  Administered 2023-01-11: 50 ug via INTRAVENOUS

## 2023-01-11 MED ORDER — ACETAMINOPHEN 500 MG PO TABS: 1000.0000 mg | ORAL_TABLET | Freq: Four times a day (QID) | ORAL | Status: DC

## 2023-01-11 MED ORDER — SODIUM CHLORIDE 0.9% FLUSH: 3.0000 mL | INTRAVENOUS | Status: DC | PRN

## 2023-01-11 MED ORDER — PRAMIPEXOLE DIHYDROCHLORIDE 0.25 MG PO TABS: 0.2500 mg | ORAL_TABLET | ORAL | Status: DC

## 2023-01-11 MED ORDER — FENTANYL CITRATE (PF) 250 MCG/5ML IJ SOLN
INTRAMUSCULAR | Status: DC | PRN
  Administered 2023-01-11: 50 ug via INTRAVENOUS

## 2023-01-11 MED ORDER — OXYCODONE HCL 5 MG PO TABS: 5.0000 mg | ORAL_TABLET | ORAL | Status: DC | PRN

## 2023-01-11 MED ORDER — ONDANSETRON HCL 4 MG/2ML IJ SOLN: 4.0000 mg | Freq: Four times a day (QID) | INTRAMUSCULAR | Status: DC | PRN

## 2023-01-11 MED ORDER — ORAL CARE MOUTH RINSE: 15.0000 mL | Freq: Once | OROMUCOSAL | Status: AC

## 2023-01-11 MED ORDER — PROPOFOL 10 MG/ML IV BOLUS
INTRAVENOUS | Status: DC | PRN
  Administered 2023-01-11: 100 mg via INTRAVENOUS

## 2023-01-11 MED ORDER — CEFAZOLIN SODIUM-DEXTROSE 2-4 GM/100ML-% IV SOLN: 2.0000 g | Freq: Three times a day (TID) | INTRAVENOUS | Status: DC

## 2023-01-11 MED ORDER — PHENYLEPHRINE HCL-NACL 20-0.9 MG/250ML-% IV SOLN
INTRAVENOUS | Status: DC | PRN
  Administered 2023-01-11: 20 ug/min via INTRAVENOUS

## 2023-01-11 MED ORDER — BUPIVACAINE-EPINEPHRINE (PF) 0.25% -1:200000 IJ SOLN
INTRAMUSCULAR | Status: AC
  Filled 2023-01-11: qty 30

## 2023-01-11 MED ORDER — FENTANYL CITRATE (PF) 250 MCG/5ML IJ SOLN
INTRAMUSCULAR | Status: AC
  Filled 2023-01-11: qty 5

## 2023-01-11 MED ORDER — POTASSIUM CHLORIDE ER 10 MEQ PO TBCR
10.0000 meq | EXTENDED_RELEASE_TABLET | Freq: Every day | ORAL | Status: DC
  Filled 2023-01-11: qty 1

## 2023-01-11 MED ORDER — FENTANYL CITRATE (PF) 100 MCG/2ML IJ SOLN
INTRAMUSCULAR | Status: AC
  Filled 2023-01-11: qty 2

## 2023-01-11 MED ORDER — BACITRACIN ZINC 500 UNIT/GM EX OINT
TOPICAL_OINTMENT | CUTANEOUS | Status: AC
  Filled 2023-01-11: qty 28.35

## 2023-01-11 MED ORDER — OXYCODONE HCL 5 MG PO TABS: 10.0000 mg | ORAL_TABLET | ORAL | Status: DC | PRN

## 2023-01-11 MED ORDER — LIDOCAINE 2% (20 MG/ML) 5 ML SYRINGE
INTRAMUSCULAR | Status: DC | PRN
  Administered 2023-01-11: 60 mg via INTRAVENOUS

## 2023-01-11 MED ORDER — SUGAMMADEX SODIUM 200 MG/2ML IV SOLN
INTRAVENOUS | Status: DC | PRN
  Administered 2023-01-11: 200 mg via INTRAVENOUS

## 2023-01-11 MED ORDER — 0.9 % SODIUM CHLORIDE (POUR BTL) OPTIME
TOPICAL | Status: DC | PRN
  Administered 2023-01-11: 1000 mL

## 2023-01-11 MED ORDER — ACETAMINOPHEN 500 MG PO TABS
1000.0000 mg | ORAL_TABLET | Freq: Once | ORAL | Status: AC
  Administered 2023-01-11: 1000 mg via ORAL
  Filled 2023-01-11: qty 2

## 2023-01-11 MED ORDER — ACETAMINOPHEN 325 MG PO TABS: 650.0000 mg | ORAL_TABLET | ORAL | Status: DC | PRN

## 2023-01-11 MED ORDER — ZOLPIDEM TARTRATE 5 MG PO TABS: 5.0000 mg | ORAL_TABLET | Freq: Every evening | ORAL | Status: DC | PRN

## 2023-01-11 MED ORDER — BISACODYL 10 MG RE SUPP: 10.0000 mg | Freq: Every day | RECTAL | Status: DC | PRN

## 2023-01-11 SURGICAL SUPPLY — 42 items
APL SKNCLS STERI-STRIP NONHPOA (GAUZE/BANDAGES/DRESSINGS) ×1
BAG COUNTER SPONGE SURGICOUNT (BAG) ×1 IMPLANT
BAG SPNG CNTER NS LX DISP (BAG) ×1
BENZOIN TINCTURE PRP APPL 2/3 (GAUZE/BANDAGES/DRESSINGS) ×1 IMPLANT
BLADE CLIPPER SURG (BLADE) IMPLANT
BLADE SURG 15 STRL LF DISP TIS (BLADE) ×1 IMPLANT
BLADE SURG 15 STRL SS (BLADE) ×1
CEMENT KYPHON C01A KIT/MIXER (Cement) IMPLANT
DRAPE C-ARM 42X72 X-RAY (DRAPES) ×1 IMPLANT
DRAPE HALF SHEET 40X57 (DRAPES) ×1 IMPLANT
DRAPE INCISE IOBAN 66X45 STRL (DRAPES) ×1 IMPLANT
DRAPE LAPAROTOMY 100X72X124 (DRAPES) ×1 IMPLANT
DRAPE SURG 17X23 STRL (DRAPES) ×4 IMPLANT
DRAPE WARM FLUID 44X44 (DRAPES) ×1 IMPLANT
DRSG OPSITE POSTOP 4X6 (GAUZE/BANDAGES/DRESSINGS) IMPLANT
DRSG OPSITE POSTOP 4X8 (GAUZE/BANDAGES/DRESSINGS) IMPLANT
GAUZE 4X4 16PLY ~~LOC~~+RFID DBL (SPONGE) ×1 IMPLANT
GAUZE SPONGE 4X4 12PLY STRL (GAUZE/BANDAGES/DRESSINGS) ×1 IMPLANT
GLOVE BIO SURGEON STRL SZ8 (GLOVE) ×1 IMPLANT
GLOVE BIO SURGEON STRL SZ8.5 (GLOVE) ×1 IMPLANT
GLOVE EXAM NITRILE XL STR (GLOVE) IMPLANT
GOWN STRL REUS W/ TWL LRG LVL3 (GOWN DISPOSABLE) IMPLANT
GOWN STRL REUS W/ TWL XL LVL3 (GOWN DISPOSABLE) IMPLANT
GOWN STRL REUS W/TWL LRG LVL3 (GOWN DISPOSABLE)
GOWN STRL REUS W/TWL XL LVL3 (GOWN DISPOSABLE)
INTRODUCER DEVICE OSTEO LEVEL (INTRODUCER) IMPLANT
KIT BASIN OR (CUSTOM PROCEDURE TRAY) ×1 IMPLANT
KIT POSITION SURG JACKSON T1 (MISCELLANEOUS) ×1 IMPLANT
KIT TURNOVER KIT B (KITS) ×1 IMPLANT
NEEDLE HYPO 22GX1.5 SAFETY (NEEDLE) ×1 IMPLANT
NS IRRIG 1000ML POUR BTL (IV SOLUTION) ×1 IMPLANT
PACK EENT II TURBAN DRAPE (CUSTOM PROCEDURE TRAY) ×1 IMPLANT
PAD ARMBOARD 7.5X6 YLW CONV (MISCELLANEOUS) ×3 IMPLANT
SOL ELECTROSURG ANTI STICK (MISCELLANEOUS)
SOLUTION ELECTROSURG ANTI STCK (MISCELLANEOUS) ×1 IMPLANT
SPECIMEN JAR SMALL (MISCELLANEOUS) IMPLANT
STRIP CLOSURE SKIN 1/2X4 (GAUZE/BANDAGES/DRESSINGS) ×1 IMPLANT
SUT VIC AB 3-0 SH 8-18 (SUTURE) ×1 IMPLANT
SYR CONTROL 10ML LL (SYRINGE) ×1 IMPLANT
TOWEL GREEN STERILE (TOWEL DISPOSABLE) ×1 IMPLANT
TOWEL GREEN STERILE FF (TOWEL DISPOSABLE) ×1 IMPLANT
TRAY KYPHOPAK 15/3 ONESTEP 1ST (MISCELLANEOUS) IMPLANT

## 2023-01-11 NOTE — Anesthesia Postprocedure Evaluation (Signed)
Anesthesia Post Note  Patient: Tiffany Washington  Procedure(s) Performed: KYPHOPLASTY THORACIC SEVEN-THORACIC EIGHT (Spine Thoracic)     Patient location during evaluation: PACU Anesthesia Type: General Level of consciousness: awake and alert, oriented and patient cooperative Pain management: pain level controlled Vital Signs Assessment: post-procedure vital signs reviewed and stable Respiratory status: spontaneous breathing, nonlabored ventilation and respiratory function stable Cardiovascular status: blood pressure returned to baseline and stable Postop Assessment: no apparent nausea or vomiting Anesthetic complications: no   No notable events documented.  Last Vitals:  Vitals:   01/11/23 1441 01/11/23 1445  BP: 136/73 121/65  Pulse: 64 67  Resp: 14 17  Temp: (!) 36.4 C   SpO2: 100% 97%    Last Pain:  Vitals:   01/11/23 1441  TempSrc:   PainSc: 0-No pain                 Lannie Fields

## 2023-01-11 NOTE — Discharge Summary (Signed)
Physician Discharge Summary     Providing Compassionate, Quality Care - Together   Patient ID: Tiffany Washington MRN: [id number] DOB/AGE: 1941-06-21 82 y.o.  Admit date: 01/11/2023 Discharge date: 01/11/2023  Admission Diagnoses: Thoracic compression fracture with delayed healing  Discharge Diagnoses:  Principal Problem:   Thoracic compression fracture, with delayed healing, subsequent encounter   Discharged Condition: good  Hospital Course: Patient underwent a bilateral T7 and T8 hypoplastic by Dr. Lovell Sheehan on 01/11/2023. She was admitted to 3C07 following recovery from anesthesia in the PACU. Her postoperative course has been uncomplicated. She has worked with physical therapy who feels the patient is ready for discharge home. She is ambulating independently and without difficulty. She is tolerating a normal diet. She is not having any bowel or bladder dysfunction. Her pain is well-controlled with oral pain medication. She is ready for discharge home.   Consults: None  Treatments: surgery: Bilateral T7 and T8 kyphoplasty  Discharge Exam: Blood pressure (!) 104/90, pulse 66, temperature 98 F (36.7 C), resp. rate 18, height 5\' 4"  (1.626 m), weight 77.1 kg, SpO2 100 %.  Per report: Alert and oriented x 4 PERRLA CN II-XII grossly intact MAE, Strength and sensation intact Incisions covered with Honeycomb dressings and Steri Strips; Dressings are clean, dry, and intact   Disposition: Discharge disposition: 01-Home or Self Care       Discharge Instructions     Call MD for:  difficulty breathing, headache or visual disturbances   Complete by: As directed    Call MD for:  hives   Complete by: As directed    Call MD for:  persistant nausea and vomiting   Complete by: As directed    Call MD for:  redness, tenderness, or signs of infection (pain, swelling, redness, odor or green/yellow discharge around incision site)   Complete by: As directed    Call MD for:  severe  uncontrolled pain   Complete by: As directed    Call MD for:  temperature >100.4   Complete by: As directed    Diet - low sodium heart healthy   Complete by: As directed    Increase activity slowly   Complete by: As directed       Allergies as of 01/11/2023       Reactions   Ciprofloxacin Other (See Comments)   Patient stated she "cannot take this"   Diclofenac Other (See Comments)   Reaction not recalled   Indomethacin Other (See Comments)   Reaction not recalled   Mobic [meloxicam] Other (See Comments)   Reaction not recalled   Tegretol [carbamazepine] Other (See Comments)   Doesn't remember reaction   Voltaren [diclofenac Sodium] Other (See Comments)   Reaction not recalled   Gabapentin Other (See Comments)   Makes the patient unable to sleep   Pamelor [nortriptyline Hcl] Other (See Comments)   insomnia   Ropinirole Other (See Comments)   Speed up the restless legs   Sulfa Antibiotics Diarrhea, Nausea And Vomiting        Medication List     TAKE these medications    acetaminophen 325 MG tablet Commonly known as: TYLENOL Take 650 mg by mouth every 6 (six) hours as needed for moderate pain or headache.   amoxicillin 500 MG tablet Commonly known as: AMOXIL Take 4 tablets (2,000 mg total) by mouth as directed. 1 HOUR PRIOR TO DENTAL APPOINTMENTS   aspirin EC 81 MG tablet Take 1 tablet (81 mg total) by mouth daily. Swallow whole.  CALCIUM 500 PO Take 1,000 mg by mouth daily.   cholecalciferol 25 MCG (1000 UNIT) tablet Commonly known as: VITAMIN D3 Take 1,000 Units by mouth daily.   furosemide 40 MG tablet Commonly known as: LASIX Take 40 mg by mouth daily.   hydroxypropyl methylcellulose / hypromellose 2.5 % ophthalmic solution Commonly known as: ISOPTO TEARS / GONIOVISC Place 1 drop into both eyes as needed for dry eyes.   potassium chloride 10 MEQ tablet Commonly known as: KLOR-CON Take 1 tablet (10 mEq total) by mouth daily. What changed: how  much to take   pramipexole 0.25 MG tablet Commonly known as: MIRAPEX Take 0.25 mg by mouth 2 (two) times daily. 1600 and bedtime   propranolol 10 MG tablet Commonly known as: INDERAL Take 1/2 tablet ( 5 mg ) at night if needed for palpitations   rosuvastatin 20 MG tablet Commonly known as: CRESTOR Take 1 tablet (20 mg total) by mouth daily.   vitamin C 1000 MG tablet Take 1,000 mg by mouth daily.   Zinc 30 MG Caps Take 30 mg by mouth daily.        Follow-up Information     Tressie Stalker, MD Follow up on 02/09/2023.   Specialty: Neurosurgery Why: First post op appointment is on 02/09/2023 at 8:45 AM. Contact information: [address] [place] [address] [phone]                 Signed: Val Eagle, DNP, AGNP-C Nurse Practitioner  [facility] [address], [address], [place], [place] [address] P: [phone]    F: [phone]  01/11/2023, 5:21 PM

## 2023-01-11 NOTE — H&P (Signed)
Subjective: The patient is an 82 year old osteoporotic white female who has complained of back pain after a lifting incident.  She failed medical management and was worked up with basic x-rays and a thoracic MRI which demonstrated T7 and T8 compression fractures.  I discussed the various treatment options with her.  She has decided proceed with a kyphoplasty.  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 Clifton James & Dr. Laneta Simmers   Spondylolisthesis of lumbar region    Stroke [facility]) 2022   Right hand numbness    Past Surgical History:  Procedure Laterality Date   BACK SURGERY  10/2019   CESAREAN SECTION     COLONOSCOPY     COLONOSCOPY W/ BIOPSIES AND POLYPECTOMY     INTRAOPERATIVE TRANSTHORACIC ECHOCARDIOGRAM N/A 03/21/2022   Procedure: INTRAOPERATIVE TRANSTHORACIC ECHOCARDIOGRAM;  Surgeon: Kathleene Hazel, MD;  Location: [facility] INV[facility];  Service: Open Heart Surgery;  Laterality: N/A;   KNEE SURGERY Bilateral    Bilateral   MULTIPLE TOOTH EXTRACTIONS     POLYPECTOMY     RIGHT/LEFT HEART CATH AND CORONARY ANGIOGRAPHY N/A 12/06/2021   Procedure: RIGHT/LEFT HEART CATH AND CORONARY ANGIOGRAPHY;  Surgeon: Lyn Records, MD;  Location: [facility];  Service: Cardiovascular;  Laterality: N/A;   TRANSCATHETER AORTIC VALVE REPLACEMENT, TRANSFEMORAL N/A 03/21/2022   Procedure: Transcatheter  Aortic Valve Replacement, Transfemoral;  Surgeon: Kathleene Hazel, MD;  Location: [facility];  Service: Open Heart Surgery;  Laterality: N/A;   UPPER GASTROINTESTINAL ENDOSCOPY     VAGINAL HYSTERECTOMY      Allergies  Allergen Reactions   Ciprofloxacin Other (See Comments)    Patient stated she "cannot take this"   Diclofenac Other (See Comments)    Reaction not recalled   Indomethacin Other (See Comments)    Reaction not recalled   Mobic [Meloxicam] Other (See Comments)    Reaction not recalled   Tegretol [Carbamazepine] Other (See Comments)    Doesn't remember reaction   Voltaren [Diclofenac Sodium] Other (See Comments)    Reaction not recalled   Gabapentin Other (See Comments)    Makes the patient unable to sleep   Pamelor [Nortriptyline Hcl] Other (See Comments)    insomnia   Ropinirole Other (See Comments)    Speed up the restless legs   Sulfa Antibiotics Diarrhea and Nausea And Vomiting    Social History   Tobacco Use   Smoking status: Never   Smokeless tobacco: Never  Substance Use Topics   Alcohol use: No    Family History  Problem Relation Age of Onset   Hypertension Mother    Congestive Heart Failure Mother        Died, 42   Heart failure Mother    Pulmonary embolism Father  Died, 37   Diabetes Sister    CAD Brother 65       CABG   Diabetes Brother    Gout Son    Prior to Admission medications   Medication Sig Start Date End Date Taking? Authorizing Provider  acetaminophen (TYLENOL) 325 MG tablet Take 650 mg by mouth every 6 (six) hours as needed for moderate pain or headache.   Yes [provider]  Ascorbic Acid (VITAMIN C) 1000 MG tablet Take 1,000 mg by mouth daily.   Yes [provider]  aspirin EC 81 MG tablet Take 1 tablet (81 mg total) by mouth daily. Swallow whole. 03/14/22  Yes Kathleene Hazel, MD  Calcium Carbonate (CALCIUM 500 PO) Take 1,000 mg by mouth daily.   Yes [provider]   cholecalciferol (VITAMIN D3) 25 MCG (1000 UNIT) tablet Take 1,000 Units by mouth daily.   Yes [provider]  furosemide (LASIX) 40 MG tablet Take 40 mg by mouth daily.   Yes [provider]  hydroxypropyl methylcellulose / hypromellose (ISOPTO TEARS / GONIOVISC) 2.5 % ophthalmic solution Place 1 drop into both eyes as needed for dry eyes.   Yes [provider]  potassium chloride (KLOR-CON) 10 MEQ tablet Take 1 tablet (10 mEq total) by mouth daily. Patient taking differently: Take 20 mEq by mouth daily. 04/19/22  Yes Georgie Chard D, NP  pramipexole (MIRAPEX) 0.25 MG tablet Take 0.25 mg by mouth 2 (two) times daily. 1600 and bedtime   Yes [provider]  rosuvastatin (CRESTOR) 20 MG tablet Take 1 tablet (20 mg total) by mouth daily. 01/07/21  Yes Merry Lofty, NP  Zinc 30 MG CAPS Take 30 mg by mouth daily.   Yes [provider]  amoxicillin (AMOXIL) 500 MG tablet Take 4 tablets (2,000 mg total) by mouth as directed. 1 HOUR PRIOR TO DENTAL APPOINTMENTS 03/29/22   Janetta Hora, PA-C  propranolol (INDERAL) 10 MG tablet Take 1/2 tablet ( 5 mg ) at night if needed for palpitations Patient not taking: Reported on 01/03/2023 05/22/22   Rollene Rotunda, MD     Review of Systems  Positive ROS: As above  All other systems have been reviewed and were otherwise negative with the exception of those mentioned in the HPI and as above.  Objective: Vital signs in last 24 hours: Temp:  [97.7 F (36.5 C)] 97.7 F (36.5 C) (06/13 0804) Pulse Rate:  [75] 75 (06/13 0804) Resp:  [17] 17 (06/13 0804) BP: (131)/(66) 131/66 (06/13 0804) SpO2:  [97 %] 97 % (06/13 0804) Weight:  [77.1 kg] 77.1 kg (06/13 0804) Estimated body mass index is 29.18 kg/m as calculated from the following:   Height as of this encounter: 5\' 4"  (1.626 m).   Weight as of this encounter: 77.1 kg.   General Appearance: Alert Head: Normocephalic, without obvious abnormality,  atraumatic Eyes: PERRL, conjunctiva/corneas clear, EOM's intact,    Ears: Normal  Throat: Normal  Neck: Supple, Back: unremarkable Lungs: Clear to auscultation bilaterally, respirations unlabored Heart: Regular rate and rhythm, no murmur, rub or gallop Abdomen: Soft, non-tender Extremities: Extremities normal, atraumatic, no cyanosis or edema Skin: unremarkable  NEUROLOGIC:   Mental status: alert and oriented,Motor Exam - grossly normal Sensory Exam - grossly normal Reflexes:  Coordination - grossly normal Gait - grossly normal Balance - grossly normal Cranial Nerves: I: smell Not tested  II: visual acuity  OS: Normal  OD: Normal   II: visual fields Full to confrontation  II: pupils Equal, round, reactive to light  III,VII: ptosis None  III,IV,VI: extraocular muscles  Full ROM  V: mastication Normal  V: facial light touch sensation  Normal  V,VII: corneal reflex  Present  VII: facial muscle function - upper  Normal  VII: facial muscle function - lower Normal  VIII: hearing Not tested  IX: soft palate elevation  Normal  IX,X: gag reflex Present  XI: trapezius strength  5/5  XI: sternocleidomastoid strength 5/5  XI: neck flexion strength  5/5  XII: tongue strength  Normal    Data Review Lab Results  Component Value Date   WBC 7.1 01/08/2023   HGB 12.2 01/08/2023   HCT 38.5 01/08/2023   MCV 95.3 01/08/2023   PLT 258 01/08/2023   Lab Results  Component Value Date   NA 138 01/08/2023   K 3.8 01/08/2023   CL 103 01/08/2023   CO2 25 01/08/2023   BUN 18 01/08/2023   CREATININE 0.86 01/08/2023   GLUCOSE 107 (H) 01/08/2023   Lab Results  Component Value Date   INR 1.1 03/17/2022    Assessment/Plan: T7 and T8 pathologic compression fracture in an osteoporotic patient: I discussed the situation with the patient and her husband.  We discussed the various treatment options including surgery.  I described the surgical treatment option with T7 and T8 kyphoplasty.  I  have shown him surgical models.  I gave him a surgical pamphlet.  We have discussed the risk, benefits, alternatives, expected postop course, and likelihood of achieving our goals with surgery.  I have answered all her questions.  She has decided proceed with surgery.   Cristi Loron 01/11/2023 10:23 AM

## 2023-01-11 NOTE — Transfer of Care (Signed)
Immediate Anesthesia Transfer of Care Note  Patient: Tiffany Washington  Procedure(s) Performed: KYPHOPLASTY THORACIC SEVEN-THORACIC EIGHT (Spine Thoracic)  Patient Location: PACU  Anesthesia Type:General  Level of Consciousness: patient cooperative and responds to stimulation  Airway & Oxygen Therapy: Patient Spontanous Breathing and Patient connected to face mask oxygen  Post-op Assessment: Report given to RN and Post -op Vital signs reviewed and stable  Post vital signs: Reviewed and stable  Last Vitals:  Vitals Value Taken Time  BP 136/73 01/11/23 1441  Temp    Pulse 60 01/11/23 1444  Resp 20 01/11/23 1444  SpO2 96 % 01/11/23 1444  Vitals shown include unvalidated device data.  Last Pain:  Vitals:   01/11/23 0846  TempSrc:   PainSc: 0-No pain         Complications: No notable events documented.

## 2023-01-11 NOTE — Op Note (Signed)
Brief history: The patient is an 82 year old white female with osteoporosis who presented with a T7 and T8 compression fracture.  She failed medical management.  She decided to proceed with a kyphoplasty.  Preop diagnosis: Pathologic thoracic compression fracture in an osteoporotic patient, thoracic spine pain  Postop diagnosis: The same  Procedure: Bilateral T7 and T8 kyphoplasty  Surgeon: Dr. Delma Officer  Assistant: None  Anesthesia: General tracheal  Estimated blood loss: 50 cc  Specimens: None  Drains: None  Complications: None  Description of procedure: The patient was brought to the operating room by the anesthesia team.  General endotracheal anesthesia was induced.  She was turned to the prone position on the chest rolls.  Her thoracic region was then prepared with Betadine scrub and Betadine solution.  Sterile drapes were applied.  I then injected Marcaine with epinephrine solution over the areas to be incised.  I then made small incisions over the bilateral T7 and T8 pedicles.  Under biplanar fluoroscopy I cannulated the bilateral T7 and T8 pedicles.  I then drilled vertebral body through the cannula, inserted balloon, inflated the balloon, deflated balloons, removed the balloons and then injected bone cement under biplanar fluoroscopy.  Upon injection at T8 on the left there was a bit of lateral displacement of the cement so we stopped injecting that side.  After I was satisfied with the injection we removed the cannulas.  I reapproximated patient's subcutaneous sutures with erupted 3-0 Vicryl suture.  I reapproximated the skin with Steri-Strips and benzoin.  The wound was then coated with bacitracin ointment.  A sterile dressing was applied.  The drapes were removed.  By report all sponge, instrument, and needle counts were correct at the end of this case.

## 2023-01-11 NOTE — Plan of Care (Signed)
Pt doing well. Pt and husband given D/C instructions with verbal understanding. Rx was sent to the pharmacy by MD. Pt's incision is clean and dry with no sign of infection. Pt's IV was removed prior to D/C. Pt D/C'd home via wheelchair per MD order. Pt is stable @ D/C and has no other needs at this time. Annette Liotta, RN  

## 2023-01-11 NOTE — Evaluation (Signed)
Physical Therapy Evaluation Patient Details Name: Tiffany Washington MRN: [id number] DOB: 06-07-1941 Today's Date: 01/11/2023  History of Present Illness  Pt is 82 year old presented to [facility] on  01/11/23 for T7 and T8 compression fx. Underwent T7, T8 kyphoplasty on 6/13.  PMH - arthritis, osteoporosis, TAVR, CVA, back surgery, ICH  Clinical Impression  Pt close to baseline with mobility and no further PT needed.  Pt with severe knee arthritis at baseline and has impaired gait. Ready for dc from PT standpoint.        Recommendations for follow up therapy are one component of a multi-disciplinary discharge planning process, led by the attending physician.  Recommendations may be updated based on patient status, additional functional criteria and insurance authorization.  Follow Up Recommendations       Assistance Recommended at Discharge Intermittent Supervision/Assistance  Patient can return home with the following  Help with stairs or ramp for entrance;A little help with bathing/dressing/bathroom;Assist for transportation;Assistance with cooking/housework    Equipment Recommendations None recommended by PT  Recommendations for Other Services       Functional Status Assessment Patient has not had a recent decline in their functional status     Precautions / Restrictions Precautions Precautions: Fall;Back Precaution Booklet Issued: Yes (comment) Restrictions Weight Bearing Restrictions: No      Mobility  Bed Mobility Overal bed mobility: Needs Assistance Bed Mobility: Sidelying to Sit   Sidelying to sit: Min assist       General bed mobility comments: assist to elevate trunk into sitting    Transfers Overall transfer level: Needs assistance Equipment used: Rollator (4 wheels) Transfers: Sit to/from Stand Sit to Stand: Supervision           General transfer comment: Incr time to rise    Ambulation/Gait Ambulation/Gait assistance: Min guard Gait Distance  (Feet): 150 Feet Assistive device: Rollator (4 wheels) Gait Pattern/deviations: Step-through pattern, Decreased stride length, Trendelenburg, Trunk flexed Gait velocity: decr Gait velocity interpretation: <1.31 ft/sec, indicative of household ambulator   General Gait Details: Assist for safety. Limping due to severe arthritis especially in lt knee  Stairs            Wheelchair Mobility    Modified Rankin (Stroke Patients Only)       Balance Overall balance assessment: Needs assistance Sitting-balance support: No upper extremity supported, Feet supported Sitting balance-Leahy Scale: Good     Standing balance support: Single extremity supported, Bilateral upper extremity supported Standing balance-Leahy Scale: Poor Standing balance comment: UE support preferred                             Pertinent Vitals/Pain Pain Assessment Pain Assessment: Faces Faces Pain Scale: Hurts little more Pain Location: back Pain Descriptors / Indicators: Sore Pain Intervention(s): Limited activity within patient's tolerance, Monitored during session    Home Living Family/patient expects to be discharged to:: Private residence Living Arrangements: Spouse/significant other Available Help at Discharge: Family;Available 24 hours/day Type of Home: House Home Access: Stairs to enter Entrance Stairs-Rails: None Entrance Stairs-Number of Steps: 1 (step up into house)   Home Layout: One level Home Equipment: Agricultural consultant (2 wheels);Rollator (4 wheels);Cane - single point;BSC/3in1;Cane - quad;Grab bars - tub/shower      Prior Function Prior Level of Function : Independent/Modified Independent             Mobility Comments: Uses rollator. Has been sleeping in recliner since back fx's. Limited by arthritic  knees       Hand Dominance        Extremity/Trunk Assessment   Upper Extremity Assessment Upper Extremity Assessment: Overall WFL for tasks assessed    Lower  Extremity Assessment Lower Extremity Assessment: Generalized weakness       Communication   Communication: HOH  Cognition Arousal/Alertness: Awake/alert Behavior During Therapy: WFL for tasks assessed/performed Overall Cognitive Status: Within Functional Limits for tasks assessed                                          General Comments      Exercises     Assessment/Plan    PT Assessment Patient does not need any further PT services  PT Problem List         PT Treatment Interventions      PT Goals (Current goals can be found in the Care Plan section)  Acute Rehab PT Goals PT Goal Formulation: All assessment and education complete, DC therapy    Frequency       Co-evaluation               AM-PAC PT "6 Clicks" Mobility  Outcome Measure Help needed turning from your back to your side while in a flat bed without using bedrails?: A Little Help needed moving from lying on your back to sitting on the side of a flat bed without using bedrails?: A Little Help needed moving to and from a bed to a chair (including a wheelchair)?: A Little Help needed standing up from a chair using your arms (e.g., wheelchair or bedside chair)?: A Little Help needed to walk in hospital room?: A Little Help needed climbing 3-5 steps with a railing? : A Little 6 Click Score: 18    End of Session   Activity Tolerance: Patient tolerated treatment well Patient left: in bed;with family/visitor present;with call bell/phone within reach (sitting EOB) Nurse Communication: Mobility status PT Visit Diagnosis: Other abnormalities of gait and mobility (R26.89)    Time: 1610-9604 PT Time Calculation (min) (ACUTE ONLY): 20 min   Charges:   PT Evaluation $PT Eval Low Complexity: 1 Low          [facility] PT Acute Rehabilitation Services Office [phone]   Angelina Ok [facility] 01/11/2023, 5:02 PM

## 2023-01-11 NOTE — Anesthesia Procedure Notes (Signed)
Procedure Name: Intubation Date/Time: 01/11/2023 1:11 PM  Performed by: Margarita Rana, CRNAPre-anesthesia Checklist: Patient identified, Patient being monitored, Timeout performed, Emergency Drugs available and Suction available Patient Re-evaluated:Patient Re-evaluated prior to induction Oxygen Delivery Method: Circle System Utilized Preoxygenation: Pre-oxygenation with 100% oxygen Induction Type: IV induction Ventilation: Mask ventilation without difficulty and Oral airway inserted - appropriate to patient size Laryngoscope Size: Mac and 3 Grade View: Grade I Tube type: Oral Tube size: 7.0 mm Number of attempts: 1 Airway Equipment and Method: Stylet and Oral airway Placement Confirmation: ETT inserted through vocal cords under direct vision, positive ETCO2 and breath sounds checked- equal and bilateral Secured at: 21 cm Tube secured with: Tape Dental Injury: Teeth and Oropharynx as per pre-operative assessment

## 2023-01-12 ENCOUNTER — Encounter (HOSPITAL_COMMUNITY): Payer: Self-pay | Admitting: Neurosurgery

## 2023-01-18 ENCOUNTER — Other Ambulatory Visit: Payer: Self-pay

## 2023-02-17 ENCOUNTER — Other Ambulatory Visit: Payer: Self-pay | Admitting: Physician Assistant

## 2023-02-20 DIAGNOSIS — Z6830 Body mass index (BMI) 30.0-30.9, adult: Secondary | ICD-10-CM | POA: Diagnosis not present

## 2023-02-20 DIAGNOSIS — S22060G Wedge compression fracture of T7-T8 vertebra, subsequent encounter for fracture with delayed healing: Secondary | ICD-10-CM | POA: Diagnosis not present

## 2023-03-12 DIAGNOSIS — I350 Nonrheumatic aortic (valve) stenosis: Secondary | ICD-10-CM | POA: Diagnosis not present

## 2023-03-12 DIAGNOSIS — I059 Rheumatic mitral valve disease, unspecified: Secondary | ICD-10-CM | POA: Diagnosis not present

## 2023-03-12 DIAGNOSIS — R600 Localized edema: Secondary | ICD-10-CM | POA: Diagnosis not present

## 2023-03-12 DIAGNOSIS — I5032 Chronic diastolic (congestive) heart failure: Secondary | ICD-10-CM | POA: Diagnosis not present

## 2023-03-19 DIAGNOSIS — R600 Localized edema: Secondary | ICD-10-CM | POA: Diagnosis not present

## 2023-03-19 DIAGNOSIS — I5032 Chronic diastolic (congestive) heart failure: Secondary | ICD-10-CM | POA: Diagnosis not present

## 2023-03-19 DIAGNOSIS — I350 Nonrheumatic aortic (valve) stenosis: Secondary | ICD-10-CM | POA: Diagnosis not present

## 2023-03-19 DIAGNOSIS — I059 Rheumatic mitral valve disease, unspecified: Secondary | ICD-10-CM | POA: Diagnosis not present

## 2023-03-19 LAB — LAB REPORT - SCANNED: EGFR: 68.7

## 2023-03-20 NOTE — Progress Notes (Unsigned)
[facility]   MULTIDISCIPLINARY HEART VALVE CLINIC                                     Cardiology Office Note:    Date:  03/21/2023   ID:  Tiffany Washington, DOB 12/10/1940, MRN [id number]  PCP:  Alysia Penna, MD  [facility] Cardiologist:  Rollene Rotunda, MD / Dr. Clifton James, MD & Dr. Laneta Simmers, MD (TAVR)  [facility] Electrophysiologist:  None   Referring MD: Alysia Penna, MD   Chief Complaint  Patient presents with   Follow-up    1 yr s/p TAVR   History of Present Illness:    Tiffany Washington is a 82 y.o. 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    The patient is followed by Dr. Antoine Poche and has a history of aortic stenosis. She was admitted in 11/2021 for acute hypoxic respiratory failure secondary to acute diastolic heart failure and severe aortic stenosis. Echocardiogram showed a hyperdynamic LV at >75% with G2DD, splay artifact with degenerative MV, moderate to severe MR, moderate MS, moderate TR, mild AI, and moderate to severe aortic stenosis which appeared to have progressed since prior echocardiogram. Structural heart was consulted while she was admitted and she was seen by Dr. Clifton James. L/RHC on 12/06/2021 with nonobstructive CAD, RA mean 4, PA mean 32 mmHg, PCWP mean 18 mmHg, LVEDP 15 mmHg, PVR 2.7 WU, Fick CO/CI 5.19/2.83, peak to peak aortic valve mean gradient 25 mmHg, transmitral mean gradient 11 mmHg with MV area 1.64 cm2. Pre-TAVR CTs were completed showing a small aortic annulus that sized to a 20 mm Edwards SAPIEN versus a 23 versus 26 Evolut Fx with adequate transfemoral access. She was diuresed with IV lasix and discharged home on 40 mg furosemide daily.    She was evaluated by the  multidisciplinary valve team and underwent successful TAVR with a 26 mm Medtronic Evolut FX THV via the TF approach on 03/21/22. Post operative echo showed EF 60%, normally functioning TAVR with a mean gradient of 2 mmHg and no PVL as well as severe MAC with moderate MR with splay artifact. She was discharged home on ASA 81mg .   In follow up with our team, she was noted to be doing well however was seen 11/2022 for BLE and was treated with increased Lasix. Echo performed which showed some progression of her mitral regurgitation and mitral stenosis. She is here today for one year TAVR follow up and continues to have bilateral edema. As above she has been treated with increasing Lasix in May then most recently was seen by her PCP office who increased to 60mg  x 4 days then she resumed 40mg  thereafter. Reports edema with weeping at that time. She notes improvement in swelling but this came back over the following weeks. She had repeat labs performed last week however I am unable to see these in EPIC. Plan to have labs sent for review. Otherwise she denies chest pain, LE edema, palpitations, orthopnea, dizziness, or syncope.   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 Clifton James & Dr. Laneta Simmers   Spondylolisthesis of lumbar region    Stroke [facility]) 2022   Right hand numbness    Past Surgical History:  Procedure  Laterality Date   BACK SURGERY  10/2019   CESAREAN SECTION     COLONOSCOPY     COLONOSCOPY W/ BIOPSIES AND POLYPECTOMY     INTRAOPERATIVE TRANSTHORACIC ECHOCARDIOGRAM N/A 03/21/2022   Procedure: INTRAOPERATIVE TRANSTHORACIC ECHOCARDIOGRAM;  Surgeon: Kathleene Hazel, MD;  Location: [facility] INVASIVE CV LAB;  Service: Open Heart Surgery;  Laterality: N/A;   KNEE SURGERY Bilateral    Bilateral   KYPHOPLASTY N/A 01/11/2023   Procedure: KYPHOPLASTY THORACIC SEVEN-THORACIC EIGHT;  Surgeon: Tressie Stalker, MD;  Location: [facility] OR;  Service: Neurosurgery;  Laterality: N/A;   MULTIPLE TOOTH EXTRACTIONS     POLYPECTOMY     RIGHT/LEFT HEART CATH AND CORONARY ANGIOGRAPHY N/A 12/06/2021   Procedure: RIGHT/LEFT HEART CATH AND CORONARY ANGIOGRAPHY;  Surgeon: Lyn Records, MD;  Location: [facility] INVASIVE CV LAB;  Service: Cardiovascular;  Laterality: N/A;   TRANSCATHETER AORTIC VALVE REPLACEMENT, TRANSFEMORAL N/A 03/21/2022   Procedure: Transcatheter Aortic Valve Replacement, Transfemoral;  Surgeon: Kathleene Hazel, MD;  Location: [facility] INVASIVE CV LAB;  Service: Open Heart Surgery;  Laterality: N/A;   UPPER GASTROINTESTINAL ENDOSCOPY     VAGINAL HYSTERECTOMY      Current Medications: Current Meds  Medication Sig   acetaminophen (TYLENOL) 325 MG tablet Take 650 mg by mouth every 6 (six) hours as needed for moderate pain or headache.   amoxicillin (AMOXIL) 500 MG tablet Take 4 tablets (2,000 mg total) by mouth as directed. 1 HOUR PRIOR TO DENTAL APPOINTMENTS   Ascorbic Acid (VITAMIN C) 1000 MG tablet Take 1,000 mg by mouth daily.   aspirin EC 81 MG tablet Take 1 tablet (81 mg total) by mouth daily. Swallow whole.   Calcium Carbonate (CALCIUM 500 PO) Take 1,000 mg by mouth daily.   cholecalciferol (VITAMIN D3) 25 MCG (1000 UNIT) tablet Take 1,000 Units by mouth daily.   furosemide (LASIX) 40 MG tablet Take 40 mg by mouth daily.   hydroxypropyl methylcellulose / hypromellose (ISOPTO TEARS / GONIOVISC) 2.5  % ophthalmic solution Place 1 drop into both eyes as needed for dry eyes.   potassium chloride (KLOR-CON) 10 MEQ tablet Take 1 tablet (10 mEq total) by mouth daily. (Patient taking differently: Take 20 mEq by mouth daily.)   pramipexole (MIRAPEX) 0.25 MG tablet Take 0.25 mg by mouth 2 (two) times daily. 1600 and bedtime   propranolol (INDERAL) 10 MG tablet Take 1/2 tablet ( 5 mg ) at night if needed for palpitations   rosuvastatin (CRESTOR) 20 MG tablet Take 1 tablet (20 mg total) by mouth daily.   Zinc 30 MG CAPS Take 30 mg by mouth daily.     Allergies:   Ciprofloxacin, Diclofenac, Indomethacin, Mobic [meloxicam], Tegretol [carbamazepine], Voltaren [diclofenac sodium], Gabapentin,  Pamelor [nortriptyline hcl], Ropinirole, and Sulfa antibiotics   Social History   Socioeconomic History   Marital status: Married    Spouse name: Renae Gloss   Number of children: 2   Years of education: Not on file   Highest education level: High school graduate  Occupational History   Occupation: retired  Tobacco Use   Smoking status: Never   Smokeless tobacco: Never  Vaping Use   Vaping status: Never Used  Substance and Sexual Activity   Alcohol use: No   Drug use: No   Sexual activity: Not Currently  Other Topics Concern   Not on file  Social History Narrative   Working on a farm   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   She is married for 52 years and they have two children.   Social Determinants of Health   Financial Resource Strain: Not on file  Food Insecurity: No Food Insecurity (12/08/2021)   Hunger Vital Sign    Worried About Running Out of Food in the Last Year: Never true    Ran Out of Food in the Last Year: Never true  Transportation Needs: No Transportation Needs (12/08/2021)   PRAPARE - Administrator, Civil Service (Medical): No    Lack of Transportation (Non-Medical): No  Physical Activity: Not on file  Stress: Not on file  Social Connections: Not on file      Family History: The patient's family history includes CAD (age of onset: 89) in her brother; Congestive Heart Failure in her mother; Diabetes in her brother and sister; Gout in her son; Heart failure in her mother; Hypertension in her mother; Pulmonary embolism in her father.  ROS:   Please see the history of present illness.    All other systems reviewed and are negative.  EKGs/Labs/Other Studies Reviewed:    The following studies were reviewed today:  Cardiac Studies & Procedures   CARDIAC CATHETERIZATION  CARDIAC CATHETERIZATION 12/06/2021  Narrative CONCLUSIONS: Irregularities from proximal to distal LAD with 40 to 50% proximal and [place] 0, 1, 1 bifurcation in the mid vessel with 0%, 50%, and 70% stenoses. Left main is widely patent Circumflex is codominant and widely patent Right coronary is codominant with luminal irregularities Mild pulmonary hypertension, WHO group 2 with mean PA pressure 32 mmHg. Peak to peak aortic valve gradient 21 mmHg and mean gradient 25 mmHg.  Calculated aortic valve area 1.1 cm, consistent with moderate mitral stenosis. Transmitral mean gradient 11 mmHg with calculated mitral valve area 1.64 cm Cardiac output by Fick 5.19 L/min and cardiac index 2.83 L/min/m. Mean pulmonary wedge pressure 18 mmHg, LVEDP 15 mmHg, and pulmonary vascular resistance 2.7 Wood units.  RECOMMENDATIONS: Compensated heart failure with patient able to lie flat.  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 Structural heart team evaluating the patient for possible valve procedures.  Findings Coronary Findings Diagnostic  Dominance: Co-dominant  Left Anterior Descending Vessel is moderate in size. There is mild diffuse disease throughout the vessel. Mid LAD lesion is 50% stenosed.  First Diagonal Branch Vessel is small in size.  Second Diagonal Branch 2nd Diag lesion is 70% stenosed.  Left Circumflex There is mild diffuse  disease throughout the vessel.  First Obtuse Marginal Branch Vessel is small in size.  First Left Posterolateral Branch Vessel is large in size.  Second Left Posterolateral Branch Vessel is small in size.  Right Coronary Artery The vessel exhibits minimal luminal irregularities.  Intervention  No interventions have been documented.   STRESS TESTS  MYOCARDIAL PERFUSION IMAGING 07/18/2018  Narrative  The left ventricular ejection fraction is normal (55-65%).  Nuclear stress EF: 64%.  There was no ST segment deviation noted during stress.  The study is normal.  1. EF 64%, normal wall motion. 2. No evidence for ischemia or infarction by perfusion defects.  Normal study.   ECHOCARDIOGRAM  ECHOCARDIOGRAM COMPLETE 03/21/2023  Narrative ECHOCARDIOGRAM REPORT    Patient Name:   Tiffany Washington Date of Exam: 03/21/2023 Medical Rec #:  [id number]        Height:       64.0 in Accession #:    [id number]       Weight:       170.0 lb Date of Birth:  1940-09-23         BSA:          1.826 m Patient Age:    82 years         BP:           130/70 mmHg Patient Gender: F                HR:           72 bpm. Exam Location:  [address]  Procedure: 2D Echo, Cardiac Doppler, Color Doppler, 3D Echo and Strain Analysis  Indications:    Z95.2 Post TAVR evaluation  History:        Patient has prior history of Echocardiogram examinations, most recent 01/03/2023. CHF, CAD; Risk Factors:Hypertension and Dyslipidemia. Aortic stenosis. Intracerebral hemorrhage. Aortic Valve: 26 mm stented (TAVR) valve is present in the aortic position.  Sonographer:    Cathie Beams RCS Referring Phys: Janetta Hora  IMPRESSIONS   1. Left ventricular ejection fraction, by estimation, is 60 to 65%. The left ventricle has normal function. The left ventricle has no regional wall motion abnormalities. Left ventricular diastolic parameters are indeterminate. Elevated left ventricular end-diastolic  pressure. The average left ventricular global longitudinal strain is -21.9 %. The global longitudinal strain is normal. 2. Right ventricular systolic function is normal. The right ventricular size is normal. There is moderately elevated pulmonary artery systolic pressure. 3. Left atrial size was severely dilated. 4. Mitral valve mean gradient 7 mmHg, compared with 10 mmHg 12/2022. The mitral valve is degenerative. Moderate to severe mitral valve regurgitation. Moderate to severe mitral stenosis. Severe mitral annular calcification. 5. The aortic valve has been repaired/replaced. Aortic valve regurgitation is not visualized. No aortic stenosis is present. There is a 26 mm stented (TAVR) valve present in the aortic position. 6. The inferior vena cava is normal in size with greater than 50% respiratory variability, suggesting right atrial pressure of 3 mmHg.  FINDINGS Left Ventricle: Left ventricular ejection fraction, by estimation, is 60 to 65%. The left ventricle has normal function. The left ventricle has no regional wall motion abnormalities. The average left ventricular global longitudinal strain is -21.9 %. The global longitudinal strain is normal. The left ventricular internal cavity size was normal in size. There is no left ventricular hypertrophy. Left ventricular diastolic function could not be evaluated due to mitral annular calcification (moderate or greater). Left ventricular diastolic parameters are indeterminate. Elevated left ventricular end-diastolic pressure.  Right Ventricle: The right ventricular size is normal. No increase in right ventricular wall thickness. Right ventricular systolic function is normal. There is moderately elevated pulmonary artery systolic pressure. The tricuspid regurgitant velocity is 3.31 m/s, and with an assumed right atrial  pressure of 3 mmHg, the estimated right ventricular systolic pressure is 46.8 mmHg.  Left Atrium: Left atrial size was severely  dilated.  Right Atrium: Right atrial size was normal in size.  Pericardium: There is no evidence of pericardial effusion.  Mitral Valve: Mitral valve mean gradient 7 mmHg, compared with 10 mmHg 12/2022. The mitral valve is degenerative in appearance. There is severe thickening of the posterior mitral valve leaflet(s). There is severe calcification of the posterior mitral valve leaflet(s). Severe mitral annular calcification. Moderate to severe mitral valve regurgitation. Moderate to severe mitral valve stenosis. MV peak gradient, 22.8 mmHg. The mean mitral valve gradient is 7.5 mmHg with average heart rate of 67 bpm.  Tricuspid Valve: The tricuspid valve is normal in structure. Tricuspid valve regurgitation is mild . No evidence of tricuspid stenosis.  Aortic Valve: The aortic valve has been repaired/replaced. Aortic valve regurgitation is not visualized. No aortic stenosis is present. Aortic valve mean gradient measures 4.0 mmHg. Aortic valve peak gradient measures 10.5 mmHg. There is a 26 mm stented (TAVR) valve present in the aortic position.  Pulmonic Valve: The pulmonic valve was normal in structure. Pulmonic valve regurgitation is mild to moderate. No evidence of pulmonic stenosis.  Aorta: The aortic root is normal in size and structure.  Venous: The inferior vena cava is normal in size with greater than 50% respiratory variability, suggesting right atrial pressure of 3 mmHg.  IAS/Shunts: No atrial level shunt detected by color flow Doppler.   LEFT VENTRICLE PLAX 2D LVIDd:         3.70 cm Diastology LVIDs:         2.50 cm LV e' medial:    4.50 cm/s LV PW:         0.90 cm LV E/e' medial:  46.2 LV IVS:        1.00 cm LV e' lateral:   7.25 cm/s LV E/e' lateral: 28.7  2D Longitudinal Strain 2D Strain GLS (A2C):   -22.0 % 2D Strain GLS (A3C):   -21.1 % 2D Strain GLS (A4C):   -22.5 % 2D Strain GLS Avg:     -21.9 %  3D Volume EF: 3D EF:        60 % LV EDV:       141 ml LV ESV:        56 ml LV SV:        85 ml  RIGHT VENTRICLE RV Basal diam:  3.10 cm RV S prime:     13.30 cm/s TAPSE (M-mode): 3.3 cm RVSP:           46.8 mmHg  LEFT ATRIUM             Index        RIGHT ATRIUM           Index LA diam:        4.90 cm 2.68 cm/m   RA Pressure: 3.00 mmHg LA Vol (A2C):   93.3 ml 51.10 ml/m  RA Area:     16.40 cm LA Vol (A4C):   75.6 ml 41.41 ml/m  RA Volume:   44.70 ml  24.48 ml/m LA Biplane Vol: 84.4 ml 46.23 ml/m AORTIC VALVE AV Vmax:           162.00 cm/s AV Vmean:          96.600 cm/s AV VTI:            0.328 m AV Peak Grad:  10.5 mmHg AV Mean Grad:      4.0 mmHg LVOT Vmax:         101.00 cm/s LVOT Vmean:        70.900 cm/s LVOT VTI:          0.228 m LVOT/AV VTI ratio: 0.70  AORTA Ao Asc diam: 3.80 cm  MITRAL VALVE                TRICUSPID VALVE MV Area (PHT): 2.39 cm     TR Peak grad:   43.8 mmHg MV Peak grad:  22.8 mmHg    TR Vmax:        331.00 cm/s MV Mean grad:  7.5 mmHg     Estimated RAP:  3.00 mmHg MV Vmax:       2.38 m/s     RVSP:           46.8 mmHg MV Vmean:      126.0 cm/s MV Decel Time: 318 msec     SHUNTS MR Peak grad: 63.2 mmHg     Systemic VTI: 0.23 m MR Mean grad: 43.0 mmHg MR Vmax:      397.50 cm/s MR Vmean:     311.0 cm/s MV E velocity: 208.00 cm/s MV A velocity: 109.00 cm/s MV E/A ratio:  1.91  Chilton Si MD Electronically signed by Chilton Si MD Signature Date/Time: 03/21/2023/11:44:26 AM    Final     CT SCANS  CT CORONARY MORPH W/CTA COR W/SCORE 12/07/2021  Addendum 12/07/2021  7:23 PM ADDENDUM REPORT: 12/07/2021 19:21  CLINICAL DATA:  35 -year-old female with severe aortic stenosis being evaluated for a TAVR procedure.  EXAM: Cardiac TAVR CT  TECHNIQUE: The patient was scanned on a [employer]. A 120 kV retrospective scan was triggered in the descending thoracic aorta at 111 HU's. Gantry rotation speed was 250 msecs and collimation was .6 mm. No beta blockade or nitro were  given. The 3D data set was reconstructed in 5% intervals of the R-R cycle. Systolic and diastolic phases were analyzed on a dedicated work station using MPR, MIP and VRT modes. The patient received 80 cc of contrast.  FINDINGS: Aortic Root:  Aortic valve: Trileaflet  Aortic valve calcium score: 1165  Aortic annulus:  Diameter: 24mm x 17mm  Perimeter: 64mm  Area: 305 mm^2  Calcifications: No calcifications  Coronary height: Min Left - 15mm, Max Left - 18mm; Min Right - 12mm  Sinotubular height: Left cusp - 21mm; Right cusp - 17mm; Noncoronary cusp - 21mm  LVOT (as measured 3 mm below the annulus):  Diameter: 25mm x 16mm  Area: 303 mm^2  Calcifications: No calcifications  Aortic sinus width: Left cusp - 30mm; Right cusp - 27mm; Noncoronary cusp - 31mm  Sinotubular junction width: 28mm x 25mm  Optimum Fluoroscopic Angle for Delivery: LAO 3 CAU 6  Cardiac:  Right atrium: Mild enlargement  Right ventricle: Mild enlargement  Pulmonary arteries: Normal size  Pulmonary veins: Normal configuration  Left atrium: Mild enlargement  Left ventricle: Normal size  Pericardium: Normal thickness  Mitral valve: Severe mitral annular calcification  Coronary arteries: Calcium score 1657 (95th percentile)  IMPRESSION: 1. Trileaflet aortic valve with moderate calcifications (AV calcium score 1165)  2. Small aortic annulus measuring 24mm x 17mm in diameter with perimeter 64mm and area 305 mm^2. No annular or LVOT calcification. By annular area, would be appropriate for placement of 23mm Evolut valve, though by perimeter would size to a 26mm valve. Sinus of  valsalva diameter would be borderline for 26mm valve however (measures 27mm at right cusp), but would be appropriate for 23mm valve. Would discuss at structural heart conference  3. Sufficient coronary to annulus distance, measuring 12mm to RCA and 15mm to left main  4.  Optimum Fluoroscopic Angle for Delivery:   LAO 3 CAU 6  5.  Coronary calcium score 1657 (95th percentile)   Electronically Signed By: Epifanio Lesches M.D. On: 12/07/2021 19:21  Narrative EXAM: OVER-READ INTERPRETATION  CT CHEST  The following report is an over-read performed by radiologist Dr. Allegra Lai of [facility] on 12/07/2021. This over-read does not include interpretation of cardiac or coronary anatomy or pathology. The coronary calcium score/coronary CTA interpretation by the cardiologist is attached.  COMPARISON:  None Available.  FINDINGS: Extracardiac findings will be described separately under dictation for contemporaneously obtained CTA chest, abdomen and pelvis.  IMPRESSION: Please see separate dictation for contemporaneously obtained CTA chest, abdomen and pelvis dated 12/07/2021 for full description of relevant extracardiac findings.  Electronically Signed: By: Allegra Lai M.D. On: 12/07/2021 12:49          EKG:  EKG is not ordered today.    Recent Labs: 03/22/2022: Magnesium 1.9 12/01/2022: ALT 17; BNP 127.5 01/08/2023: BUN 18; Creatinine, Ser 0.86; Hemoglobin 12.2; Platelets 258; Potassium 3.8; Sodium 138   Recent Lipid Panel    Component Value Date/Time   CHOL 111 12/06/2021 0153   TRIG 107 12/06/2021 0153   HDL 38 (L) 12/06/2021 0153   CHOLHDL 2.9 12/06/2021 0153   VLDL 21 12/06/2021 0153   LDLCALC 52 12/06/2021 0153   Physical Exam:    VS:  BP 122/76   Pulse (!) 55   Ht 5\' 4"  (1.626 m)   Wt 177 lb 12.8 oz (80.6 kg)   SpO2 98%   BMI 30.52 kg/m     Wt Readings from Last 3 Encounters:  03/21/23 177 lb 12.8 oz (80.6 kg)  01/11/23 170 lb (77.1 kg)  01/08/23 176 lb 3.2 oz (79.9 kg)    General: Well developed, well nourished, NAD Lungs:Clear to ausculation bilaterally. No wheezes, rales, or rhonchi. Breathing is unlabored. Cardiovascular: RRR with S1 S2. + murmurs Extremities: 1-2+ BLE.  Neuro: Alert and oriented. No focal deficits. No facial  asymmetry. MAE spontaneously. Psych: Responds to questions appropriately with normal affect.    ASSESSMENT/PLAN:    Severe AS s/p TAVR: Having increased symptoms of BLE with NYHA class II symptoms however likely secondary to mod/severe MR/MS. Echo today shows normal LVEF and a AV mean gradient of , peak 10.16mmHg. There is severe mitral valve degeneration with a mean gradient at (previously ) with thickening of the posterior leaflet, moderate to severe MR and moderate to severe MS with a peak gradient at 22.24mmHg. Continue ASA monotherapy and dental SBE. Will discuss echo findings with Dr. Antoine Poche and our [facility] team.    Moderate to severe MR: Echo today as above with progressive MV disease and NYHA class II symptoms of persistent BLE. Unclear if TEER is an option for her with severe annular calcifications and MS. Previously not felt to be a SAVR candidate therefore would predict she is not an MVR candidate as well. Continue to manage fluid volume with diuretics and will have our team discuss.     Chronic diastolic CHF: Recently increased Lasix to 60mg  x 4 days with follow up labs per PCP although I am unable to see these results. Chart review with normal Cr and  K+. Will have her increase Lasix to 60mg  daily and KCL to 40mg  daily x 4 days then resume normal dosing. Will attempt to get PCP labs. If normal, will continue 60mg  daily. They live in [place] so follow up is not very easy and quick for them.     CAD: LHC 05/23 with 50% mid LAD, 70% D2. Continue high-intensity statin and ASA. Denies anginal symptoms.    HTN: Stable with no changes needed today.    HLD: Continue rosuvastatin 20   Hx hemorrhagic CVA: Stable with no new neuro changes. Doing well on ASA 81mg .   Medication Adjustments/Labs and Tests Ordered: Current medicines are reviewed at length with the patient today.  Concerns regarding medicines are outlined above.  No orders of the defined types were placed in this  encounter.  No orders of the defined types were placed in this encounter.   Patient Instructions  Medication Instructions:   FOR 4 DAYS ONLY: TAKE FUROSEMIDE 60 MG ONCE  A DAY  AND INCREASE POTASSIUM  TO 40 MEQ DAILY   THE RESUME BACK TO FUROSEMIDE 40 ONE A DAY AND POTASSIUM 20 MEQ ONCE AS DAY   *If you need a refill on your cardiac medications before your next appointment, please call your pharmacy*   Lab Work:  NONE ORDERED  TODAY    If you have labs (blood work) drawn today and your tests are completely normal, you will receive your results only by: MyChart Message (if you have MyChart) OR A paper copy in the mail If you have any lab test that is abnormal or we need to change your treatment, we will call you to review the results.   Testing/Procedures: NONE ORDERED  TODAY      Follow-Up: At [facility], you and your health needs are our priority.  As part of our continuing mission to provide you with exceptional heart care, we have created designated Provider Care Teams.  These Care Teams include your primary Cardiologist (physician) and Advanced Practice Providers (APPs -  Physician Assistants and Nurse Practitioners) who all work together to provide you with the care you need, when you need it.  We recommend signing up for the patient portal called "MyChart".  Sign up information is provided on this After Visit Summary.  MyChart is used to connect with patients for Virtual Visits (Telemedicine).  Patients are able to view lab/test results, encounter notes, upcoming appointments, etc.  Non-urgent messages can be sent to your provider as well.   To learn more about what you can do with MyChart, go to [email or web address].    Your next appointment:  AS SCHEDULED    Provider:   Rollene Rotunda, MD     Other Instructions     Signed, Georgie Chard, NP  03/21/2023 3:14 PM    [facility]

## 2023-03-21 ENCOUNTER — Ambulatory Visit (HOSPITAL_COMMUNITY): Payer: PPO

## 2023-03-21 ENCOUNTER — Other Ambulatory Visit: Payer: Self-pay | Admitting: Physician Assistant

## 2023-03-21 ENCOUNTER — Ambulatory Visit (INDEPENDENT_AMBULATORY_CARE_PROVIDER_SITE_OTHER): Payer: PPO | Admitting: Cardiology

## 2023-03-21 VITALS — BP 122/76 | HR 55 | Ht 64.0 in | Wt 177.8 lb

## 2023-03-21 DIAGNOSIS — R600 Localized edema: Secondary | ICD-10-CM | POA: Insufficient documentation

## 2023-03-21 DIAGNOSIS — I050 Rheumatic mitral stenosis: Secondary | ICD-10-CM | POA: Diagnosis not present

## 2023-03-21 DIAGNOSIS — I10 Essential (primary) hypertension: Secondary | ICD-10-CM

## 2023-03-21 DIAGNOSIS — I2584 Coronary atherosclerosis due to calcified coronary lesion: Secondary | ICD-10-CM | POA: Diagnosis not present

## 2023-03-21 DIAGNOSIS — Z952 Presence of prosthetic heart valve: Secondary | ICD-10-CM

## 2023-03-21 DIAGNOSIS — I2510 Atherosclerotic heart disease of native coronary artery without angina pectoris: Secondary | ICD-10-CM

## 2023-03-21 DIAGNOSIS — I5032 Chronic diastolic (congestive) heart failure: Secondary | ICD-10-CM

## 2023-03-21 DIAGNOSIS — I350 Nonrheumatic aortic (valve) stenosis: Secondary | ICD-10-CM | POA: Insufficient documentation

## 2023-03-21 DIAGNOSIS — I340 Nonrheumatic mitral (valve) insufficiency: Secondary | ICD-10-CM | POA: Insufficient documentation

## 2023-03-21 LAB — ECHOCARDIOGRAM COMPLETE
AV Mean grad: 4 mmHg
AV Peak grad: 10.5 mmHg
Ao pk vel: 1.62 m/s
Area-P 1/2: 2.39 cm2
MV M vel: 3.98 m/s
MV Peak grad: 63.2 mmHg
S' Lateral: 2.5 cm

## 2023-03-21 NOTE — Patient Instructions (Addendum)
Medication Instructions:   FOR 4 DAYS ONLY: TAKE FUROSEMIDE 60 MG ONCE  A DAY  AND INCREASE POTASSIUM  TO 40 MEQ DAILY   THE RESUME BACK TO FUROSEMIDE 40 ONE A DAY AND POTASSIUM 20 MEQ ONCE AS DAY   *If you need a refill on your cardiac medications before your next appointment, please call your pharmacy*   Lab Work:  NONE ORDERED  TODAY    If you have labs (blood work) drawn today and your tests are completely normal, you will receive your results only by: MyChart Message (if you have MyChart) OR A paper copy in the mail If you have any lab test that is abnormal or we need to change your treatment, we will call you to review the results.   Testing/Procedures: NONE ORDERED  TODAY      Follow-Up: At [facility], you and your health needs are our priority.  As part of our continuing mission to provide you with exceptional heart care, we have created designated Provider Care Teams.  These Care Teams include your primary Cardiologist (physician) and Advanced Practice Providers (APPs -  Physician Assistants and Nurse Practitioners) who all work together to provide you with the care you need, when you need it.  We recommend signing up for the patient portal called "MyChart".  Sign up information is provided on this After Visit Summary.  MyChart is used to connect with patients for Virtual Visits (Telemedicine).  Patients are able to view lab/test results, encounter notes, upcoming appointments, etc.  Non-urgent messages can be sent to your provider as well.   To learn more about what you can do with MyChart, go to [email or web address].    Your next appointment:  AS SCHEDULED    Provider:   Rollene Rotunda, MD     Other Instructions

## 2023-03-27 ENCOUNTER — Telehealth: Payer: Self-pay

## 2023-03-27 NOTE — Telephone Encounter (Signed)
Spoke with patient and she is aware of Echo results. She verbalized understanding

## 2023-03-27 NOTE — Telephone Encounter (Signed)
-----   Message from Georgie Chard sent at 03/27/2023 12:08 PM EDT ----- Please let the patient know that her TAVR valve looks great with stable function and gradients. As we discussed, her echo does show moderate to severe mitral regurgitation which explains some of your symptoms. I apologize it has taken a a little bit to get back to you but I had Dr. Clifton James review with me. He agrees that we continue to use medicine to manage your fluid right now.

## 2023-04-30 DIAGNOSIS — I872 Venous insufficiency (chronic) (peripheral): Secondary | ICD-10-CM | POA: Diagnosis not present

## 2023-04-30 DIAGNOSIS — L308 Other specified dermatitis: Secondary | ICD-10-CM | POA: Diagnosis not present

## 2023-04-30 DIAGNOSIS — Z85820 Personal history of malignant melanoma of skin: Secondary | ICD-10-CM | POA: Diagnosis not present

## 2023-04-30 DIAGNOSIS — I8311 Varicose veins of right lower extremity with inflammation: Secondary | ICD-10-CM | POA: Diagnosis not present

## 2023-04-30 DIAGNOSIS — I8312 Varicose veins of left lower extremity with inflammation: Secondary | ICD-10-CM | POA: Diagnosis not present

## 2023-05-11 ENCOUNTER — Other Ambulatory Visit (HOSPITAL_COMMUNITY): Payer: Self-pay

## 2023-05-15 ENCOUNTER — Ambulatory Visit (HOSPITAL_COMMUNITY)
Admission: RE | Admit: 2023-05-15 | Discharge: 2023-05-15 | Disposition: A | Discharge status: Home / Self Care | Payer: PPO | Source: Ambulatory Visit | Attending: Internal Medicine | Admitting: Internal Medicine

## 2023-05-15 DIAGNOSIS — Z1272 Encounter for screening for malignant neoplasm of vagina: Secondary | ICD-10-CM | POA: Diagnosis not present

## 2023-05-15 DIAGNOSIS — Z124 Encounter for screening for malignant neoplasm of cervix: Secondary | ICD-10-CM | POA: Diagnosis not present

## 2023-05-15 DIAGNOSIS — M810 Age-related osteoporosis without current pathological fracture: Secondary | ICD-10-CM | POA: Insufficient documentation

## 2023-05-15 DIAGNOSIS — Z6831 Body mass index (BMI) 31.0-31.9, adult: Secondary | ICD-10-CM | POA: Diagnosis not present

## 2023-05-15 DIAGNOSIS — B3731 Acute candidiasis of vulva and vagina: Secondary | ICD-10-CM | POA: Diagnosis not present

## 2023-05-15 DIAGNOSIS — Z1231 Encounter for screening mammogram for malignant neoplasm of breast: Secondary | ICD-10-CM | POA: Diagnosis not present

## 2023-05-15 MED ORDER — DENOSUMAB 60 MG/ML ~~LOC~~ SOSY
60.0000 mg | PREFILLED_SYRINGE | Freq: Once | SUBCUTANEOUS | Status: AC
  Administered 2023-05-15: 60 mg via SUBCUTANEOUS

## 2023-05-15 MED ORDER — DENOSUMAB 60 MG/ML ~~LOC~~ SOSY
PREFILLED_SYRINGE | SUBCUTANEOUS | Status: AC
  Filled 2023-05-15: qty 1

## 2023-05-22 DIAGNOSIS — Z9889 Other specified postprocedural states: Secondary | ICD-10-CM | POA: Diagnosis not present

## 2023-05-22 DIAGNOSIS — Z6829 Body mass index (BMI) 29.0-29.9, adult: Secondary | ICD-10-CM | POA: Diagnosis not present

## 2023-05-27 DIAGNOSIS — I340 Nonrheumatic mitral (valve) insufficiency: Secondary | ICD-10-CM | POA: Insufficient documentation

## 2023-05-27 NOTE — Progress Notes (Unsigned)
Cardiology Office Note:   Date:  05/28/2023  ID:  Tiffany Washington, DOB 1941/06/21, MRN [id number] PCP: Alysia Penna, MD   HeartCare Providers Cardiologist:  Rollene Rotunda, MD {  History of Present Illness:   Tiffany Washington is a 82 y.o. female who presents for evaluation of coronary calcium.  This was noted to have coronary calcium on a CT.  This was in the LM and was three vessel.  The CT was done to evaluate abdominal pain.    She had a negative Lexiscan Myoview in 2019.    She was admitted in 11/2021 for acute hypoxic respiratory failure secondary to acute diastolic heart failure and severe aortic stenosis. 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   Since I last saw her she gets around slowly with walker.  She has had no new cardiovascular complaints.  She denies any new shortness of breath, PND or orthopnea.  She has had no palpitations, presyncope or syncope.  She denies any chest pressure, neck or arm discomfort.  She has had no weight gain.  She does have some lower extremity edema on the right greater than left leg and had a rash that was treated by dermatologist.   ROS: She is limited by joint and back pain.  Otherwise as stated in HPI negative for all other systems.  Studies Reviewed:    EKG:   EKG Interpretation Date/Time:  Monday May 28 2023 12:00:43 EDT Ventricular Rate:  64 PR Interval:  160 QRS Duration:  78 QT Interval:  450 QTC Calculation: 464 R Axis:   -19  Text Interpretation: Normal sinus rhythm Normal ECG When compared with ECG of 22-Mar-2022 04:27, No significant change was found Confirmed by Rollene Rotunda ([id number]) on 05/28/2023 12:31:18 PM     Risk Assessment/Calculations:              Physical Exam:   VS:  BP 128/74 (BP Location: Left Arm, Patient Position: Sitting, Cuff Size: Normal)   Pulse 64   Ht 5\' 4"  (1.626 m)   Wt 175 lb 12.8 oz (79.7 kg)   SpO2 98%   BMI 30.18 kg/m     Wt Readings from Last 3 Encounters:  05/28/23 175 lb 12.8 oz (79.7 kg)  03/21/23 177 lb 12.8 oz (80.6 kg)  01/11/23 170 lb (77.1 kg)     GEN: Well nourished, well developed in no acute distress NECK: No JVD; No carotid bruits CARDIAC: RRR, 3 out of 6 holosystolic murmur heard at the axilla and the apex, no diastolic murmurs, rubs, gallops RESPIRATORY:  Clear to auscultation without rales, wheezing or rhonchi  ABDOMEN: Soft, non-tender, non-distended EXTREMITIES:  No edema; No deformity   ASSESSMENT AND PLAN:   CORONARY ARTERY CALCIFICATION:   The patient has no new sypmtoms.  No further cardiovascular testing is indicated.  We will continue with aggressive risk reduction and meds as listed.  TAVR:   She had stable valve replacement in September 2023 .  She had normal function on echo in August 2024.  I will follow-up with another echo in August 2025.  MR:   This is moderate to severe.  I had a conversation with the patient and her husband about this.  They likely would not want open surgical repair or replacement.  She has some mitral stenosis as well as regurgitation.  She might not be a percutaneous repair candidate in the future but I will keep  an eye on this with the echo as above.    DYSLIPIDEMIA:   Her LDL was 58 with an HDL of 42.  No change in therapy.    HTN:   Her  BP is at target.  No change in therapy.    CVA:   She had mild residual numbness.  No change in therapy.         Follow up with me in one year.   Signed, Rollene Rotunda, MD

## 2023-05-28 ENCOUNTER — Ambulatory Visit: Payer: PPO | Attending: Cardiology | Admitting: Cardiology

## 2023-05-28 ENCOUNTER — Encounter: Payer: Self-pay | Admitting: Cardiology

## 2023-05-28 VITALS — BP 128/74 | HR 64 | Ht 64.0 in | Wt 175.8 lb

## 2023-05-28 DIAGNOSIS — E785 Hyperlipidemia, unspecified: Secondary | ICD-10-CM

## 2023-05-28 DIAGNOSIS — R002 Palpitations: Secondary | ICD-10-CM | POA: Diagnosis not present

## 2023-05-28 DIAGNOSIS — Z952 Presence of prosthetic heart valve: Secondary | ICD-10-CM | POA: Diagnosis not present

## 2023-05-28 DIAGNOSIS — I639 Cerebral infarction, unspecified: Secondary | ICD-10-CM | POA: Diagnosis not present

## 2023-05-28 DIAGNOSIS — I340 Nonrheumatic mitral (valve) insufficiency: Secondary | ICD-10-CM | POA: Diagnosis not present

## 2023-05-28 DIAGNOSIS — I10 Essential (primary) hypertension: Secondary | ICD-10-CM | POA: Diagnosis not present

## 2023-05-28 NOTE — Patient Instructions (Addendum)
Medication Instructions:   No cahgnes  *If you need a refill on your cardiac medications before your next appointment, please call your pharmacy*   Lab Work: Not needed    Testing/Procedures:   Will be schedule in Aug 2025 Your physician has requested that you have an echocardiogram. Echocardiography is a painless test that uses sound waves to create images of your heart. It provides your doctor with information about the size and shape of your heart and how well your heart's chambers and valves are working. This procedure takes approximately one hour. There are no restrictions for this procedure. Please do NOT wear cologne, perfume, aftershave, or lotions (deodorant is allowed). Please arrive 15 minutes prior to your appointment time.    Follow-Up: At [facility], you and your health needs are our priority.  As part of our continuing mission to provide you with exceptional heart care, we have created designated Provider Care Teams.  These Care Teams include your primary Cardiologist (physician) and Advanced Practice Providers (APPs -  Physician Assistants and Nurse Practitioners) who all work together to provide you with the care you need, when you need it.     Your next appointment:   10 month(s)  The format for your next appointment:   In Person  Provider:   Rollene Rotunda, MD    Other Instructions    Recommend wearing compression socks for any swelling in legs.

## 2023-06-11 DIAGNOSIS — I8312 Varicose veins of left lower extremity with inflammation: Secondary | ICD-10-CM | POA: Diagnosis not present

## 2023-06-11 DIAGNOSIS — Z85820 Personal history of malignant melanoma of skin: Secondary | ICD-10-CM | POA: Diagnosis not present

## 2023-06-11 DIAGNOSIS — I8311 Varicose veins of right lower extremity with inflammation: Secondary | ICD-10-CM | POA: Diagnosis not present

## 2023-06-11 DIAGNOSIS — L308 Other specified dermatitis: Secondary | ICD-10-CM | POA: Diagnosis not present

## 2023-06-11 DIAGNOSIS — I872 Venous insufficiency (chronic) (peripheral): Secondary | ICD-10-CM | POA: Diagnosis not present

## 2023-06-11 DIAGNOSIS — H353111 Nonexudative age-related macular degeneration, right eye, early dry stage: Secondary | ICD-10-CM | POA: Diagnosis not present

## 2023-06-19 ENCOUNTER — Other Ambulatory Visit: Payer: Self-pay | Admitting: Physician Assistant

## 2023-08-18 DIAGNOSIS — S50861A Insect bite (nonvenomous) of right forearm, initial encounter: Secondary | ICD-10-CM | POA: Diagnosis not present

## 2023-08-28 DIAGNOSIS — E785 Hyperlipidemia, unspecified: Secondary | ICD-10-CM | POA: Diagnosis not present

## 2023-08-29 DIAGNOSIS — I110 Hypertensive heart disease with heart failure: Secondary | ICD-10-CM | POA: Diagnosis not present

## 2023-08-29 DIAGNOSIS — I5032 Chronic diastolic (congestive) heart failure: Secondary | ICD-10-CM | POA: Diagnosis not present

## 2023-08-29 DIAGNOSIS — E785 Hyperlipidemia, unspecified: Secondary | ICD-10-CM | POA: Diagnosis not present

## 2023-08-29 DIAGNOSIS — E559 Vitamin D deficiency, unspecified: Secondary | ICD-10-CM | POA: Diagnosis not present

## 2023-08-29 DIAGNOSIS — I2510 Atherosclerotic heart disease of native coronary artery without angina pectoris: Secondary | ICD-10-CM | POA: Diagnosis not present

## 2023-09-04 DIAGNOSIS — I059 Rheumatic mitral valve disease, unspecified: Secondary | ICD-10-CM | POA: Diagnosis not present

## 2023-09-04 DIAGNOSIS — I5032 Chronic diastolic (congestive) heart failure: Secondary | ICD-10-CM | POA: Diagnosis not present

## 2023-09-04 DIAGNOSIS — I2510 Atherosclerotic heart disease of native coronary artery without angina pectoris: Secondary | ICD-10-CM | POA: Diagnosis not present

## 2023-09-04 DIAGNOSIS — K219 Gastro-esophageal reflux disease without esophagitis: Secondary | ICD-10-CM | POA: Diagnosis not present

## 2023-09-04 DIAGNOSIS — H04129 Dry eye syndrome of unspecified lacrimal gland: Secondary | ICD-10-CM | POA: Diagnosis not present

## 2023-09-04 DIAGNOSIS — Z1331 Encounter for screening for depression: Secondary | ICD-10-CM | POA: Diagnosis not present

## 2023-09-04 DIAGNOSIS — E785 Hyperlipidemia, unspecified: Secondary | ICD-10-CM | POA: Diagnosis not present

## 2023-09-04 DIAGNOSIS — M8000XA Age-related osteoporosis with current pathological fracture, unspecified site, initial encounter for fracture: Secondary | ICD-10-CM | POA: Diagnosis not present

## 2023-09-04 DIAGNOSIS — Z0000 Encounter for general adult medical examination without abnormal findings: Secondary | ICD-10-CM | POA: Diagnosis not present

## 2023-09-04 DIAGNOSIS — Z952 Presence of prosthetic heart valve: Secondary | ICD-10-CM | POA: Diagnosis not present

## 2023-09-04 DIAGNOSIS — I110 Hypertensive heart disease with heart failure: Secondary | ICD-10-CM | POA: Diagnosis not present

## 2023-09-04 DIAGNOSIS — I350 Nonrheumatic aortic (valve) stenosis: Secondary | ICD-10-CM | POA: Diagnosis not present

## 2023-09-04 DIAGNOSIS — I272 Pulmonary hypertension, unspecified: Secondary | ICD-10-CM | POA: Diagnosis not present

## 2023-09-04 DIAGNOSIS — I10 Essential (primary) hypertension: Secondary | ICD-10-CM | POA: Diagnosis not present

## 2023-09-04 DIAGNOSIS — Z1339 Encounter for screening examination for other mental health and behavioral disorders: Secondary | ICD-10-CM | POA: Diagnosis not present

## 2023-09-04 DIAGNOSIS — Z8673 Personal history of transient ischemic attack (TIA), and cerebral infarction without residual deficits: Secondary | ICD-10-CM | POA: Diagnosis not present

## 2023-11-05 DIAGNOSIS — M810 Age-related osteoporosis without current pathological fracture: Secondary | ICD-10-CM | POA: Diagnosis not present

## 2023-11-05 DIAGNOSIS — E559 Vitamin D deficiency, unspecified: Secondary | ICD-10-CM | POA: Diagnosis not present

## 2023-11-14 ENCOUNTER — Encounter (HOSPITAL_COMMUNITY)

## 2023-12-24 ENCOUNTER — Emergency Department (HOSPITAL_COMMUNITY)
Admission: EM | Admit: 2023-12-24 | Discharge: 2023-12-24 | Disposition: A | Discharge status: Home / Self Care | Attending: Emergency Medicine | Admitting: Emergency Medicine

## 2023-12-24 ENCOUNTER — Emergency Department (HOSPITAL_COMMUNITY)

## 2023-12-24 ENCOUNTER — Encounter (HOSPITAL_COMMUNITY): Payer: Self-pay

## 2023-12-24 ENCOUNTER — Other Ambulatory Visit: Payer: Self-pay

## 2023-12-24 DIAGNOSIS — R202 Paresthesia of skin: Secondary | ICD-10-CM | POA: Diagnosis not present

## 2023-12-24 DIAGNOSIS — R200 Anesthesia of skin: Secondary | ICD-10-CM | POA: Diagnosis not present

## 2023-12-24 DIAGNOSIS — I499 Cardiac arrhythmia, unspecified: Secondary | ICD-10-CM | POA: Diagnosis not present

## 2023-12-24 DIAGNOSIS — I110 Hypertensive heart disease with heart failure: Secondary | ICD-10-CM | POA: Insufficient documentation

## 2023-12-24 DIAGNOSIS — I5032 Chronic diastolic (congestive) heart failure: Secondary | ICD-10-CM | POA: Insufficient documentation

## 2023-12-24 DIAGNOSIS — I6523 Occlusion and stenosis of bilateral carotid arteries: Secondary | ICD-10-CM | POA: Diagnosis not present

## 2023-12-24 DIAGNOSIS — I10 Essential (primary) hypertension: Secondary | ICD-10-CM | POA: Diagnosis not present

## 2023-12-24 DIAGNOSIS — R002 Palpitations: Secondary | ICD-10-CM | POA: Diagnosis not present

## 2023-12-24 DIAGNOSIS — I700 Atherosclerosis of aorta: Secondary | ICD-10-CM | POA: Diagnosis not present

## 2023-12-24 LAB — CBC
HCT: 36.7 % (ref 36.0–46.0)
Hemoglobin: 11.7 g/dL — ABNORMAL LOW (ref 12.0–15.0)
MCH: 29.7 pg (ref 26.0–34.0)
MCHC: 31.9 g/dL (ref 30.0–36.0)
MCV: 93.1 fL (ref 80.0–100.0)
Platelets: 314 10*3/uL (ref 150–400)
RBC: 3.94 MIL/uL (ref 3.87–5.11)
RDW: 14.4 % (ref 11.5–15.5)
WBC: 8.7 10*3/uL (ref 4.0–10.5)
nRBC: 0 % (ref 0.0–0.2)

## 2023-12-24 LAB — BASIC METABOLIC PANEL WITH GFR
Anion gap: 8 (ref 5–15)
BUN: 12 mg/dL (ref 8–23)
CO2: 23 mmol/L (ref 22–32)
Calcium: 9.1 mg/dL (ref 8.9–10.3)
Chloride: 107 mmol/L (ref 98–111)
Creatinine, Ser: 0.85 mg/dL (ref 0.44–1.00)
GFR, Estimated: >60 mL/min (ref >60)
Glucose, Bld: 101 mg/dL — ABNORMAL HIGH (ref 70–99)
Potassium: 4 mmol/L (ref 3.5–5.1)
Sodium: 138 mmol/L (ref 135–145)

## 2023-12-24 LAB — TROPONIN I (HIGH SENSITIVITY)
Troponin I (High Sensitivity): 21 ng/L — ABNORMAL HIGH (ref <18)
Troponin I (High Sensitivity): 23 ng/L — ABNORMAL HIGH (ref <18)

## 2023-12-24 MED ORDER — IOHEXOL 350 MG/ML SOLN
75.0000 mL | Freq: Once | INTRAVENOUS | Status: AC | PRN
  Administered 2023-12-24: 75 mL via INTRAVENOUS

## 2023-12-24 NOTE — ED Provider Notes (Signed)
 [place] [facility] Provider Note  History  Chief Complaint:  Numbness  The history is provided by the patient and the spouse.     Tiffany Washington is a 83 y.o. female with a history of CVA, mitral regurgitation who presents the emergency department for left facial numbness.  She reports that she does have episodes where she feels that her heart is racing.  She reports that today she had 1 around noon time.  She does not carry a formal diagnosis for this.  She reports that today it was approximately 140 bpm.  States that she did not have any symptoms other than feeling fatigued.  She sat down and the symptoms resolved.  She states that approximate around 5 PM tonight she started noticing left facial numbness.  She states that it started under her left eye and radiating down to the corner of her mouth.  She denied any facial asymmetry.  No weakness or other sensation changes.  She states that she does struggle at baseline with walking given her severe back pain.  Did not notice a change in this.  Her husband who provides collateral history in the room states that the patient appeared to be at her normal baseline other than stating that her face appeared numb.  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 Abel Hoe & Dr. Sherene Dilling   Spondylolisthesis of lumbar region     Stroke [facility]) 2022   Right hand numbness    Past Surgical History:  Procedure Laterality Date   BACK SURGERY  10/2019   CESAREAN SECTION     COLONOSCOPY     COLONOSCOPY W/ BIOPSIES AND POLYPECTOMY     INTRAOPERATIVE TRANSTHORACIC ECHOCARDIOGRAM N/A 03/21/2022   Procedure: INTRAOPERATIVE TRANSTHORACIC ECHOCARDIOGRAM;  Surgeon: Odie Benne, MD;  Location: [facility] INVASIVE CV LAB;  Service: Open Heart Surgery;  Laterality: N/A;   KNEE SURGERY Bilateral    Bilateral   KYPHOPLASTY N/A 01/11/2023   Procedure: KYPHOPLASTY THORACIC SEVEN-THORACIC EIGHT;  Surgeon: Garry Kansas, MD;  Location: [facility] OR;  Service: Neurosurgery;  Laterality: N/A;   MULTIPLE TOOTH EXTRACTIONS     POLYPECTOMY     RIGHT/LEFT HEART CATH AND CORONARY ANGIOGRAPHY N/A 12/06/2021   Procedure: RIGHT/LEFT HEART CATH AND CORONARY ANGIOGRAPHY;  Surgeon: Arty Binning, MD;  Location: [facility] INVASIVE CV LAB;  Service: Cardiovascular;  Laterality: N/A;   TRANSCATHETER AORTIC VALVE REPLACEMENT, TRANSFEMORAL N/A 03/21/2022   Procedure: Transcatheter Aortic Valve Replacement, Transfemoral;  Surgeon: Odie Benne, MD;  Location: [facility] INVASIVE CV LAB;  Service: Open Heart Surgery;  Laterality: N/A;   UPPER GASTROINTESTINAL ENDOSCOPY     VAGINAL HYSTERECTOMY      Family History  Problem Relation Age of Onset   Hypertension Mother  Congestive Heart Failure Mother        Died, 71   Heart failure Mother    Pulmonary embolism Father        Died, 37   Diabetes Sister    CAD Brother 37       CABG   Diabetes Brother    Gout Son     Social History   Tobacco Use   Smoking status: Never   Smokeless tobacco: Never  Vaping Use   Vaping status: Never Used  Substance Use Topics   Alcohol  use: No   Drug use: No    Review of Systems  Review of Systems   Reviewed and documented in HPI if pertinent.   Physical Exam   ED Triage Vitals [12/24/23 1757]  Encounter Vitals Group     BP 132/72     Systolic BP  Percentile      Diastolic BP Percentile      Pulse Rate 65     Resp 18     Temp 97.7 F (36.5 C)     Temp src      SpO2 100 %     Weight      Height      Head Circumference      Peak Flow      Pain Score      Pain Loc      Pain Education      Exclude from Growth Chart    Neuro Exam  Mental Status Exam Alert and oriented Memory appropriate  Speech Speech is clear No dysarthria Language is normal  Cranial Nerves CN II: Visual fields intact to confrontation CN III, IV, VI: PERRL, EOMI, No nystagmus CN V: Facial sensation is normal and equal CN VII: No facial weakness or asymmetry CN VIII: Auditory acuity grossly normal CN IX and X: Uvula is midline, palate elevates symmetrically CN XI: Normal sternocleidomastoid and equal strength CN XII: The tongue is midline, no tongue atrophy or fasciculations  Motor Muscle Strength RUE: 5/5 flexion and extension RLE: 5/5 flexion and extension LUE: 5/5 flexion and extension LLE: 5/5 flexion and extension No pronation or drift  Muscle Tone Normal bulk and tone  Coordination Intact finger-to-nose No tremor Negative Romberg  Sensation Intact to light touch  Gait Routine gait normal   Procedures   Procedures  ED Course - Medical Decision Making  Brief Overview Tiffany Washington is a 83 y.o. female who presents as per above.  I have reviewed the nursing documentation for past medical history, family history, and social history and agree.  I have reviewed the patient's vital signs. There are no abnormalities.  Initial Differential Diagnoses: I am primarily concerned for CVA, TIA, electrolyte abnormalities, AKI, arrhythmia.  Therapies: These medications and interventions were provided for the patient while in the ED.  Medications  iohexol  (OMNIPAQUE ) 350 MG/ML injection 75 mL (75 mLs Intravenous Contrast Given 12/24/23 2144)    Testing Results: On my interpretation labs are significant for : Troponin 21,  repeat 23 Creatinine 0.85  On my interpretation imaging is significant for: CTA head neck reveals right proximal PCA stenosis Chest x-ray without pneumonia pneumothorax  EKG Interpretation Date/Time:  Monday Dec 24 2023 18:17:26 EDT Ventricular Rate:  61 PR Interval:  176 QRS Duration:  78 QT Interval:  444 QTC Calculation: 446 R Axis:   -42  Text Interpretation: Normal sinus rhythm Left axis deviation Abnormal ECG When compared with ECG of 28-May-2023 12:00, PREVIOUS ECG IS PRESENT No  significant change since last tracing Confirmed by Deatra Face [phone]) on 12/24/2023 7:28:36 PM   See the EMR for full details regarding lab and imaging results.   Medical Decision Making 83 year old female who presents to the emergency department for feeling left-sided facial sensation as well as having increased heart rate.  On exam patient has a normal neurological exam.  On EKG patient has normal sinus rhythm.  Do not see signs of arrhythmia.  Patient does have mild ST depressions in aVF.  Had no chest pain or shortness of breath.  No peripheral edema.  I discussed with the patient regarding risk of CVA.  We also discussed the risk of arrhythmia and heart damage.  Patient does have a mildly elevated troponin.  Mild ST depressions in aVF.  There is no signs for STEMI.  Laboratory studies 0.85 creatinine.   We did perform CTA imaging given her complaint.  It does reveal stenosis in the PCA.  Given presenting complaint this does not appear to be the culprit lesion.  There is no signs of stroke.    I discussed with the patient as well as family regarding admission for MRI testing and further arrhythmia workup.  We reviewed risk and benefits.  We discussed that there are risk of going home such as further cardiac damage, stroke, worsening neurological outcome, disability and death.  We did also discussed that the benefits of staying would be to get an MRI to further elucidate whether she had a stroke.   We also discussed that we could monitor on telemetry to determine if she has an arrhythmia.  We did give them time to discuss between the patient and the husband.  We ensured that they had capacity to make decisions.  They understood that if she left she could have a stroke or heart attack.  After they had time to discuss they elected to proceed home.  Given that they understood the risk and benefits I feel this is a reasonable decision.  I discussed if she develops symptoms they should report back emergently.  They were agreeable with this plan.  I did arrange for follow-up for cardiology and neurology by placing ambulatory referrals.  My attending had another discussion with the family after my discussion and they elected to pursue discharge.  Therefore patient was discharged.  Amount and/or Complexity of Data Reviewed Labs: ordered. Radiology: ordered.  Risk Prescription drug management.     ### All radiography studies, electrocardiograms, and laboratory data were personally reviewed by me and incorporated into my medical decision making. Impression   1. Facial tingling sensation   2. Cardiac arrhythmia, unspecified cardiac arrhythmia type      Note: Dragon medical dictation software was used in the creation of this note.     Arminda Landmark, MD 12/24/23 0102    Deatra Face, MD 12/29/23 [phone]

## 2023-12-24 NOTE — Discharge Instructions (Addendum)
 [facility]:  Thank you for allowing us  to take care of you today.  We hope you begin feeling better soon. You were seen today for tachycardia and facial numbness.  We discussed that we would like to admit you to the hospital for MRI.  We will review the risk, benefits and you elected to go home.  We did discuss the urgency of following up with cardiology as well as neurology.  I have placed referrals for both of these services.  If you have another episode please return the emergency department immediately.  To-Do:  Please follow-up with your primary doctor within the next 2-3 days. It is important that you review any labs or imaging results (if any) that you had today with them. Your preliminary imaging results (if any) are attached. Please return to the Emergency Department or call 911 if you experience chest pain, shortness of breath, severe pain, severe fever, altered mental status, or have any reason to think that you need emergency medical care.  Thank you again.  Hope you feel better soon.  Arminda Landmark, MD Department of Emergency Medicine

## 2023-12-24 NOTE — ED Triage Notes (Signed)
 Pt states she has a mitral valve regurgitation and eventually may need a replacement. Pt states it will act up sometimes and her heart will start racing. Pt states heart was racing and pulse went up to 140. Pt then states around 5pm the left side of face felt like there was cold water running down face but when she put her hand to face it was not wet. Pt states left side of face still feels cold but when touching both sides of race it feels the same. Denies any pain

## 2023-12-24 NOTE — Subjective & Objective (Signed)
 Reports that her heart has been racing and her her pulse went up to 140s then around 6 5 PM she felt that the left side of her face was content numb and felt like a cold water was running down her face although it was not wet No associated pain Patient has history of CVA in the past with mitral valve regurgitation History of intracranial hemorrhage She is history of aortic stenosis status post TAVR

## 2023-12-27 DIAGNOSIS — I6521 Occlusion and stenosis of right carotid artery: Secondary | ICD-10-CM | POA: Diagnosis not present

## 2023-12-27 DIAGNOSIS — I5032 Chronic diastolic (congestive) heart failure: Secondary | ICD-10-CM | POA: Diagnosis not present

## 2023-12-27 DIAGNOSIS — I350 Nonrheumatic aortic (valve) stenosis: Secondary | ICD-10-CM | POA: Diagnosis not present

## 2023-12-27 DIAGNOSIS — I110 Hypertensive heart disease with heart failure: Secondary | ICD-10-CM | POA: Diagnosis not present

## 2023-12-27 DIAGNOSIS — E785 Hyperlipidemia, unspecified: Secondary | ICD-10-CM | POA: Diagnosis not present

## 2023-12-27 DIAGNOSIS — I059 Rheumatic mitral valve disease, unspecified: Secondary | ICD-10-CM | POA: Diagnosis not present

## 2023-12-27 DIAGNOSIS — R202 Paresthesia of skin: Secondary | ICD-10-CM | POA: Diagnosis not present

## 2023-12-27 DIAGNOSIS — R002 Palpitations: Secondary | ICD-10-CM | POA: Diagnosis not present

## 2023-12-27 DIAGNOSIS — I2510 Atherosclerotic heart disease of native coronary artery without angina pectoris: Secondary | ICD-10-CM | POA: Diagnosis not present

## 2023-12-28 ENCOUNTER — Telehealth: Payer: Self-pay | Admitting: Cardiology

## 2023-12-28 NOTE — Telephone Encounter (Signed)
 Patient c/o Palpitations:  STAT if patient reporting lightheadedness, shortness of breath, or chest pain  How long have you had palpitations/irregular HR/ Afib? Are you having the symptoms now? Since Monday, no (Pt went to ER)  Are you currently experiencing lightheadedness, SOB or CP? no  Do you have a history of afib (atrial fibrillation) or irregular heart rhythm? Heart fluttering  Have you checked your BP or HR? (document readings if available): 140, 150 HR  Are you experiencing any other symptoms? Woke up this morning with left eye blood shot

## 2023-12-28 NOTE — Telephone Encounter (Signed)
 Spoke with patient and she states she was having some fluttering on 5/26. She then had some tingling on the left side of her face.  She went to the hospital but didn't want to stay for further testing and was discharged.   She is denies any chest pain SOB, headache, palpitations, headache, swelling, nausea or vomiting.. She did wake up and a blood vessel has popped in her eye.   She did follow up with PCP yesterday.   She would like for you to review ED notes and provide follow up.

## 2024-01-03 NOTE — Telephone Encounter (Signed)
 Spoke with pt regarding Dr. Atlas Washington comments on her ED visit. Pt plans to keep upcoming appointment. Pt verbalized understanding. All questions if any were answered.

## 2024-01-09 NOTE — Progress Notes (Signed)
 Cardiology Office Note:   Date:  01/10/2024  ID:  Tiffany Washington, DOB 10/25/1940, MRN [id number] PCP: Barnetta Liberty, MD  [facility] Providers Cardiologist:  Eilleen Grates, MD {  History of Present Illness:   Tiffany Washington is a 83 y.o. female female who presents for evaluation of coronary calcium .  This was noted to have coronary calcium  on a CT.  This was in the LM and was three vessel.  The CT was done to evaluate abdominal pain.    She had a negative Lexiscan  Myoview  in 2019.    She was admitted in 11/2021 for acute hypoxic respiratory failure secondary to acute diastolic heart failure and severe aortic stenosis. 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   Since I last saw her she was in the ED in May for palpitations and numbness in her face.  I reviewed these records for this visit.  This happened at rest and she had no visual or voice changes but she felt a tingling in her face.  They did do an angiogram and found no large vessel occlusion.  She had moderate stenosis of P2 with the proximal right PCA.  There was some carotid mild plaque.  She was suggested to have an MRI but she did not want to do this.  She had resolution of her symptoms and she has had none of this since then.  She did describe some probable palpitations at the time this happened.  She says she had that about 6 months before but she has not been having it since then.  She gets around with a walker because of joint pain but she can still do all her yard work including growing her garden.  She is not having any chest pressure, neck or arm discomfort.  She is not having any new shortness of breath, PND or orthopnea.   ROS: As stated in the HPI and negative for all other systems.  Studies Reviewed:    EKG:     12/24/2023 normal sinus rhythm, left axis deviation, poor anterior R wave progression, no acute ST-T wave changes.   Risk Assessment/Calculations:               Physical Exam:   VS:  BP 126/68   Pulse 66   Ht 5' 4 (1.626 m)   Wt 160 lb (72.6 kg)   SpO2 97%   BMI 27.46 kg/m    Wt Readings from Last 3 Encounters:  01/10/24 160 lb (72.6 kg)  12/24/23 175 lb 11.3 oz (79.7 kg)  05/28/23 175 lb 12.8 oz (79.7 kg)     GEN: Well nourished, well developed in no acute distress NECK: No JVD; No carotid bruits CARDIAC: RRR, 3 out of 6 holosystolic murmur heard throughout the precordium and into the axilla, no diastolic murmurs, rubs, gallops RESPIRATORY:  Clear to auscultation without rales, wheezing or rhonchi  ABDOMEN: Soft, non-tender, non-distended EXTREMITIES:  No edema; No deformity   ASSESSMENT AND PLAN:   CORONARY ARTERY CALCIFICATION:   She has had no chest discomfort.  We will continue with risk reduction.  TAVR:    This was stable on echo in August.    This will be followed up as below.   MR:   This is moderate to severe.  I will follow-up with an echocardiogram in August.   DYSLIPIDEMIA:   Her LDL was 37.  No change in therapy.  HTN:   Her  BP is at target.  No change in therapy.  TIA:   She had a probable TIA.  She also has some palpitations.  She is to see a neurologist and I encouraged her to make this appointment.  I am also going to check a 4-week monitor to rule out arrhythmias.    Follow up with me in 6 months  Signed, Eilleen Grates, MD

## 2024-01-10 ENCOUNTER — Encounter: Payer: Self-pay | Admitting: Cardiology

## 2024-01-10 ENCOUNTER — Ambulatory Visit: Attending: Cardiology | Admitting: Cardiology

## 2024-01-10 VITALS — BP 126/68 | HR 66 | Ht 64.0 in | Wt 160.0 lb

## 2024-01-10 DIAGNOSIS — I340 Nonrheumatic mitral (valve) insufficiency: Secondary | ICD-10-CM | POA: Diagnosis not present

## 2024-01-10 DIAGNOSIS — E785 Hyperlipidemia, unspecified: Secondary | ICD-10-CM

## 2024-01-10 DIAGNOSIS — I2510 Atherosclerotic heart disease of native coronary artery without angina pectoris: Secondary | ICD-10-CM

## 2024-01-10 DIAGNOSIS — R002 Palpitations: Secondary | ICD-10-CM

## 2024-01-10 NOTE — Patient Instructions (Signed)
 Medication Instructions:  Your physician recommends that you continue on your current medications as directed. Please refer to the Current Medication list given to you today.  *If you need a refill on your cardiac medications before your next appointment, please call your pharmacy*  Lab Work: NONE If you have labs (blood work) drawn today and your tests are completely normal, you will receive your results only by: MyChart Message (if you have MyChart) OR A paper copy in the mail If you have any lab test that is abnormal or we need to change your treatment, we will call you to review the results.  Testing/Procedures: 4 week heart monitor Your physician has recommended that you wear an event monitor. Event monitors are medical devices that record the heart's electrical activity. Doctors most often us  these monitors to diagnose arrhythmias. Arrhythmias are problems with the speed or rhythm of the heartbeat. The monitor is a small, portable device. You can wear one while you do your normal daily activities. This is usually used to diagnose what is causing palpitations/syncope (passing out).   Follow-Up: At [facility], you and your health needs are our priority.  As part of our continuing mission to provide you with exceptional heart care, our providers are all part of one team.  This team includes your primary Cardiologist (physician) and Advanced Practice Providers or APPs (Physician Assistants and Nurse Practitioners) who all work together to provide you with the care you need, when you need it.  Your next appointment:   6 months  Provider:   Lavonne Prairie, MD  We recommend signing up for the patient portal called MyChart.  Sign up information is provided on this After Visit Summary.  MyChart is used to connect with patients for Virtual Visits (Telemedicine).  Patients are able to view lab/test results, encounter notes, upcoming appointments, etc.  Non-urgent messages can be sent to  your provider as well.   To learn more about what you can do with MyChart, go to [email or web address].   Other Instructions [employer] Cardiac Event Monitor Instructions  Your physician has requested you wear your cardiac event monitor for _____ days, (1-30). [employer] may call or text to confirm a shipping address. The monitor will be sent to a land address via UPS. [employer] will not ship a monitor to a PO BOX. It typically takes 3-5 days to receive your monitor after it has been enrolled. [employer] will assist with USPS tracking if your package is delayed. The telephone number for [employer] is [phone]. Once you have received your monitor, please review the enclosed instructions. Instruction tutorials can also be viewed under help and settings on the enclosed cell phone. Your monitor has already been registered assigning a specific monitor serial # to you.  Billing and Self Pay Discount Information  [employer] has been provided the insurance information we had on file for you.  If your insurance has been updated, please call [employer] at [phone] to provide them with your updated insurance information.   [employer] offers a discounted Self Pay option for patients who have insurance that does not cover their cardiac event monitor or patients without insurance.  The discounted cost of a Self Pay Cardiac Event Monitor would be $225.00 , if the patient contacts [employer] at [phone] within 7 days of applying the monitor to make payment arrangements.  If the patient does not contact [employer] within 7 days of applying the monitor, the cost of the cardiac event monitor will be $350.00.  Applying the monitor  Remove cell phone from case and turn it on. The cell phone works as IT consultant and needs to be within [employer] of you at all times. The cell phone will need to be charged on a daily basis. We recommend you plug the cell phone into the enclosed charger at your  bedside table every night.  Monitor batteries: You will receive two monitor batteries labelled #1 and #2. These are your recorders. Plug battery #2 onto the second connection on the enclosed charger. Keep one battery on the charger at all times. This will keep the monitor battery deactivated. It will also keep it fully charged for when you need to switch your monitor batteries. A small light will be blinking on the battery emblem when it is charging. The light on the battery emblem will remain on when the battery is fully charged.  Open package of a Monitor strip. Insert battery #1 into black hood on strip and gently squeeze monitor battery onto connection as indicated in instruction booklet. Set aside while preparing skin.  Choose location for your strip, vertical or horizontal, as indicated in the instruction booklet. Shave to remove all hair from location. There cannot be any lotions, oils, powders, or colognes on skin where monitor is to be applied. Wipe skin clean with enclosed Saline wipe. Dry skin completely.  Peel paper labeled #1 off the back of the Monitor strip exposing the adhesive. Place the monitor on the chest in the vertical or horizontal position shown in the instruction booklet. One arrow on the monitor strip must be pointing upward. Carefully remove paper labeled #2, attaching remainder of strip to your skin. Try not to create any folds or wrinkles in the strip as you apply it.  Firmly press and release the circle in the center of the monitor battery. You will hear a small beep. This is turning the monitor battery on. The heart emblem on the monitor battery will light up every 5 seconds if the monitor battery in turned on and connected to the patient securely. Do not push and hold the circle down as this turns the monitor battery off. The cell phone will locate the monitor battery. A screen will appear on the cell phone checking the connection of your monitor strip. This  may read poor connection initially but change to good connection within the next minute. Once your monitor accepts the connection you will hear a series of 3 beeps followed by a climbing crescendo of beeps. A screen will appear on the cell phone showing the two monitor strip placement options. Touch the picture that demonstrates where you applied the monitor strip.  Your monitor strip and battery are waterproof. You are able to shower, bathe, or swim with the monitor on. They just ask you do not submerge deeper than 3 feet underwater. We recommend removing the monitor if you are swimming in a lake, river, or ocean.  Your monitor battery will need to be switched to a fully charged monitor battery approximately once a week. The cell phone will alert you of an action which needs to be made.  On the cell phone, tap for details to reveal connection status, monitor battery status, and cell phone battery status. The green dots indicates your monitor is in good status. A red dot indicates there is something that needs your attention.  To record a symptom, click the circle on the monitor battery. In 30-60 seconds a list of symptoms will appear on the cell phone. Select your symptom  and tap save. Your monitor will record a sustained or significant arrhythmia regardless of you clicking the button. Some patients do not feel the heart rhythm irregularities. [employer] will notify us  of any serious or critical events.  Refer to instruction booklet for instructions on switching batteries, changing strips, the Do not disturb or Pause features, or any additional questions.  Call [employer] at [phone], to confirm your monitor is transmitting and record your baseline. They will answer any questions you may have regarding the monitor instructions at that time.  Returning the monitor to [employer]  Place all equipment back into blue box. Peel off strip of paper to expose adhesive and close box  securely. There is a prepaid UPS shipping label on this box. Drop in a UPS drop box, or at a UPS facility like [employer]. You may also contact [employer] to arrange UPS to pick up monitor package at your home.

## 2024-01-11 ENCOUNTER — Encounter: Payer: Self-pay | Admitting: *Deleted

## 2024-01-11 NOTE — Progress Notes (Signed)
 Patient enrolled for [facility] [employer] to ship a 30 day cardiac event monitor on 01/17/24.

## 2024-01-21 DIAGNOSIS — R002 Palpitations: Secondary | ICD-10-CM | POA: Diagnosis not present

## 2024-01-24 ENCOUNTER — Telehealth: Payer: Self-pay | Admitting: *Deleted

## 2024-01-24 NOTE — Telephone Encounter (Signed)
   Cardiac Monitor Alert  Date of alert:  01/24/2024   Patient Name: Tiffany Washington  DOB: [birth year]  MRN: [id number]   [facility] Cardiologist: Lynwood Schilling, MD  [facility] EP:  None    Monitor Information: Cardiac Event Monitor [[employer]]  Reason:  palpitations Ordering provider:  james hochrein md   Alert sinus This is the 1st alert for this rhythm.   Next Cardiology Appointment   Date:  6 months  Provider:  hochrein  The patient could NOT be reached by telephone today.  Left message for her to call Arrhythmia, symptoms and history reviewed with dr cooper (DOD).   Plan:  continue to try to reach patient    Adrien Conquest, RN  01/24/2024 9:15 AM

## 2024-02-04 ENCOUNTER — Telehealth: Payer: Self-pay | Admitting: *Deleted

## 2024-02-04 ENCOUNTER — Encounter: Payer: Self-pay | Admitting: Cardiology

## 2024-02-04 DIAGNOSIS — I4891 Unspecified atrial fibrillation: Secondary | ICD-10-CM

## 2024-02-04 NOTE — Telephone Encounter (Signed)
   Cardiac Monitor Alert  Date of alert:  02/04/2024   Patient Name: Tiffany Washington  DOB: 03-19-1941  MRN: [id number]   [facility] Cardiologist: Lynwood Schilling, MD  [facility] EP:  None    Monitor Information: Cardiac Event Monitor [[employer]]  Reason:  palpitations Ordering provider:  Dr. Schilling   Alert Atrial Fibrillation/Flutter This is the 1st alert for this rhythm.  The patient has no hx of Atrial Fibrillation/Flutter.  The patient is not currently on anticoagulation.  Next Cardiology Appointment   Date:  to be scheduled  Provider:  afib clinic  The patient was contacted today. No answer, left message to call office back Arrhythmia, symptoms and history reviewed with [name].  Plan:  Stop ASA, start Eliquis  5 mg BID (if no hx of bleeding & no new stents), afib clinic appt and include primary cardiologist in messages.   Gretel Maeola CROME, RN  02/04/2024 10:46 AM

## 2024-02-04 NOTE — Telephone Encounter (Signed)
 Left message to call back   Per Dr. Michele: Ask pt if any hx of bleeding.   May stop ASA  --  make sure no new stents. Start Eliquis  5 mg BID Pt needs afib clinic about 1-2 weeks after monitor is completed CC primary cardiologist, Dr. Lavona, in messages

## 2024-02-05 ENCOUNTER — Telehealth: Payer: Self-pay | Admitting: Pharmacy Technician

## 2024-02-05 ENCOUNTER — Telehealth: Payer: Self-pay

## 2024-02-05 ENCOUNTER — Other Ambulatory Visit (HOSPITAL_COMMUNITY): Payer: Self-pay

## 2024-02-05 MED ORDER — APIXABAN 5 MG PO TABS: 5.0000 mg | ORAL_TABLET | Freq: Two times a day (BID) | ORAL | 11 refills | Status: DC

## 2024-02-05 MED ORDER — APIXABAN 5 MG PO TABS: 5.0000 mg | ORAL_TABLET | Freq: Two times a day (BID) | ORAL | 0 refills | Status: AC

## 2024-02-05 NOTE — Telephone Encounter (Signed)
 Eliquis  5 mg by mouth twice daily samples for 28 days is ready to pick on the 5th floor, Coumadin clinic at the front desk. Patient is informed.

## 2024-02-05 NOTE — Telephone Encounter (Signed)
 Medication name/dosage: Samples List: Eliquis  5 mg  Administration instructions:   Reason for samples: Reason for samples: new start  Ordering provider: Dr. Michele  *Once above information entered, route the phone encounter to CV DIV MAG ST SAMPLES and send Teams message to team member assigned to Samples for the day.   Please also give patient 30 day free trial card.    Patient also requested patient assistance, will forward to Rx Med Assistance team.

## 2024-02-05 NOTE — Telephone Encounter (Signed)
 PAP: Patient assistance application for Eliquis  through [employer] has been mailed to pt's home address on file. Provider portion of application will be uploaded to media  Called patient to notify her.

## 2024-02-05 NOTE — Telephone Encounter (Signed)
 Spoke with patient and reviewed heart monitor alert showing A-Fib and recommendations from DOD yesterday.  Per Dr. Michele: Ask pt if any hx of bleeding.   May stop ASA  --  make sure no new stents. Start Eliquis  5 mg BID Pt needs afib clinic about 1-2 weeks after monitor is completed CC primary cardiologist, Dr. Lavona, in messages   Patient states she had a light nose bleed about a week ago from dry sinuses, no other bleeding issues reported. Instructed patient she could use nasal saline spray to help moisten nasal passages to prevent drying/bleeding. She denies any new stents.  Instructed patient on Eliquis  and increased risk of bleeding and precautions to take when working outside in garden and around sharp objects/corners. Patient verbalized understanding, but she is hesitant to start Eliquis . She states she can usually tell when her heart is out of rhythm and it does not happen often. Explained to patient the risk of stroke with a-fib and the reasoning for Eliquis .   Patient agreeable to try Eliquis , but would like Dr. Lavona to review these recommendations and let her know if he wishes to do anything differently than already suggested.  Will send message to AF Clinic scheduler to setup appt at AF Clinic in 3-4 weeks. Referral ordered.

## 2024-02-13 NOTE — Telephone Encounter (Signed)
 Patient received paperwork- will check at her income and get back with me if she goes forth with application

## 2024-02-14 DIAGNOSIS — R0982 Postnasal drip: Secondary | ICD-10-CM | POA: Diagnosis not present

## 2024-02-14 DIAGNOSIS — R051 Acute cough: Secondary | ICD-10-CM | POA: Diagnosis not present

## 2024-02-14 DIAGNOSIS — R0981 Nasal congestion: Secondary | ICD-10-CM | POA: Diagnosis not present

## 2024-02-22 ENCOUNTER — Ambulatory Visit: Attending: Cardiology

## 2024-02-22 DIAGNOSIS — R002 Palpitations: Secondary | ICD-10-CM

## 2024-03-02 ENCOUNTER — Ambulatory Visit: Payer: Self-pay | Admitting: Cardiology

## 2024-03-02 DIAGNOSIS — I4891 Unspecified atrial fibrillation: Secondary | ICD-10-CM

## 2024-03-02 DIAGNOSIS — R002 Palpitations: Secondary | ICD-10-CM | POA: Diagnosis not present

## 2024-03-04 ENCOUNTER — Other Ambulatory Visit (HOSPITAL_COMMUNITY): Payer: Self-pay

## 2024-03-04 ENCOUNTER — Telehealth: Payer: Self-pay

## 2024-03-04 NOTE — Telephone Encounter (Signed)
 Pt requesting a c/b from the nurse

## 2024-03-04 NOTE — Telephone Encounter (Signed)
 Pt came to office and dropped off pt assistance forms for Eliquis . Paperwork received. Paperwork will be given to Dr. Lavona to sign and then faxed to rx team on Thursday 03/06/24.

## 2024-03-05 NOTE — Telephone Encounter (Signed)
-----   Message from [facility] sent at 03/04/2024  4:19 PM EDT -----  ----- Message ----- From: Richie Adrien ORN, RN Sent: 03/04/2024   8:58 AM EDT To: Cv Div Pharmd; Rx Prior Auth Team   pt aware of results Referral placed The patient is also needing assistance and has the paperwork that is needed that she can bring by the office today if needed. Results forwarded  ----- Message ----- From: Melvenia Hila D Sent: 03/04/2024   8:22 AM EDT To: [facility] [place] Triage  ----- Message from Hila JONETTA Melvenia sent at 03/04/2024  8:22 AM EDT -----

## 2024-03-05 NOTE — Telephone Encounter (Signed)
 Please see 02/05/24 encounter. PAP process already pending for patient.

## 2024-03-10 ENCOUNTER — Telehealth: Payer: Self-pay

## 2024-03-10 NOTE — Telephone Encounter (Signed)
 Pt assistance forms for Eliquis  emailed to [employer] on 8/11

## 2024-03-10 NOTE — Telephone Encounter (Signed)
 Docs forwarded to med assist fax

## 2024-03-11 NOTE — Telephone Encounter (Signed)
 Faxed application that was completed along with provider portion that is complete

## 2024-03-13 NOTE — Telephone Encounter (Signed)
 Scanned to chart media

## 2024-03-14 NOTE — Telephone Encounter (Signed)
 Left message on VM.

## 2024-03-27 ENCOUNTER — Encounter (HOSPITAL_COMMUNITY): Payer: Self-pay | Admitting: Internal Medicine

## 2024-03-27 ENCOUNTER — Ambulatory Visit (INDEPENDENT_AMBULATORY_CARE_PROVIDER_SITE_OTHER)
Admission: RE | Admit: 2024-03-27 | Discharge: 2024-03-27 | Disposition: A | Discharge status: Home / Self Care | Source: Ambulatory Visit | Attending: Internal Medicine | Admitting: Internal Medicine

## 2024-03-27 ENCOUNTER — Ambulatory Visit (HOSPITAL_COMMUNITY)
Admission: RE | Admit: 2024-03-27 | Discharge: 2024-03-27 | Disposition: A | Discharge status: Home / Self Care | Payer: PPO | Source: Ambulatory Visit | Attending: Cardiology | Admitting: Cardiology

## 2024-03-27 VITALS — BP 140/80 | HR 64 | Ht 64.0 in | Wt 165.4 lb

## 2024-03-27 DIAGNOSIS — I480 Paroxysmal atrial fibrillation: Secondary | ICD-10-CM | POA: Insufficient documentation

## 2024-03-27 DIAGNOSIS — I340 Nonrheumatic mitral (valve) insufficiency: Secondary | ICD-10-CM | POA: Diagnosis not present

## 2024-03-27 DIAGNOSIS — D6869 Other thrombophilia: Secondary | ICD-10-CM | POA: Diagnosis not present

## 2024-03-27 DIAGNOSIS — I4891 Unspecified atrial fibrillation: Secondary | ICD-10-CM

## 2024-03-27 DIAGNOSIS — Z952 Presence of prosthetic heart valve: Secondary | ICD-10-CM | POA: Insufficient documentation

## 2024-03-27 NOTE — Patient Instructions (Addendum)
 [employer] - 1 lead   Have labs drawn in 1 month at any [facility] -- orders attached   Follow up with Thom in June 2026 - will send letter when time to schedule.

## 2024-03-27 NOTE — Progress Notes (Signed)
 Primary Care Physician: Larnell Hamilton, MD Primary Cardiologist: Lynwood Schilling, MD Electrophysiologist: None     Referring Physician: Dr. Schilling Tiffany Washington is a 83 y.o. female with a history of AS s/p TAVR, MR, HTN, chronic diastolic HF, CAD, probable TIA, and atrial fibrillation who presents for consultation in the [facility]. Cardiac monitor placed by Dr. Schilling showed runs of Afib; patient started on OAC. Patient is on Eliquis  5 mg BID for a CHADS2VASC score of at least 7.  On evaluation today, patient is currently in NSR. She began Eliquis  and has not had bleeding issues. She does not believe she had cardiac awareness of episodes.   Today, she denies symptoms of palpitations, chest pain, shortness of breath, orthopnea, PND, lower extremity edema, dizziness, presyncope, syncope, snoring, daytime somnolence, bleeding, or neurologic sequela. The patient is tolerating medications without difficulties and is otherwise without complaint today.    Atrial Fibrillation Risk Factors:  she does not have symptoms or diagnosis of sleep apnea.  she has a BMI of Body mass index is 28.39 kg/m.[name]   03/27/24 0912  Weight: 75 kg    Current Outpatient Medications  Medication Sig Dispense Refill   acetaminophen  (TYLENOL ) 325 MG tablet Take 650 mg by mouth every 6 (six) hours as needed for moderate pain (pain score 4-6) or headache.     apixaban  (ELIQUIS ) 5 MG TABS tablet Take 1 tablet (5 mg total) by mouth 2 (two) times daily. 60 tablet 11   apixaban  (ELIQUIS ) 5 MG TABS tablet Take 1 tablet (5 mg total) by mouth 2 (two) times daily. 56 tablet 0   Ascorbic Acid (VITAMIN C) 1000 MG tablet Take 1,000 mg by mouth daily.     Calcium  Carbonate (CALCIUM  500 PO) Take 1,000 mg by mouth daily.     cholecalciferol (VITAMIN D3) 25 MCG (1000 UNIT) tablet Take 1,000 Units by mouth daily.     furosemide  (LASIX ) 40 MG tablet Take 40 mg by mouth daily  as needed for fluid or edema.     hydroxypropyl methylcellulose / hypromellose (ISOPTO TEARS / GONIOVISC) 2.5 % ophthalmic solution Place 1 drop into both eyes as needed for dry eyes.     pantoprazole  (PROTONIX ) 20 MG tablet Take 20 mg by mouth daily as needed for heartburn or indigestion.     potassium chloride  (KLOR-CON ) 10 MEQ tablet Take 2 tablet (20 mEq total) by mouth daily. 180 tablet 2   pramipexole  (MIRAPEX ) 0.25 MG tablet Take 0.25 mg by mouth 2 (two) times daily. 1600 and bedtime     PROLIA  60 MG/ML SOSY injection Inject 60 mg into the skin every 6 (six) months.     rosuvastatin  (CRESTOR ) 20 MG tablet Take 1 tablet (20 mg total) by mouth daily. 30 tablet 3   Zinc  30 MG CAPS Take 30 mg by mouth daily.     No current facility-administered medications for this encounter.    Atrial Fibrillation Management history:  Previous antiarrhythmic drugs: none Previous cardioversions: none Previous ablations: none Anticoagulation history: Eliquis    ROS- All systems are reviewed and negative except as per the HPI above.  Physical Exam: BP (!) 140/80   Pulse 64   Ht 5' 4 (1.626 m)   Wt 75 kg   BMI 28.39 kg/m   GEN: Well nourished, well developed in no acute distress NECK: No JVD; No carotid bruits CARDIAC: Regular rate and rhythm, 2/6 systolic ejection murmur; no rubs  or gallops RESPIRATORY:  Clear to auscultation without rales, wheezing or rhonchi  ABDOMEN: Soft, non-tender, non-distended EXTREMITIES:  No edema; No deformity   EKG today demonstrates  Vent. rate 64 BPM PR interval 162 ms QRS duration 78 ms QT/QTcB 430/443 ms P-R-T axes 63 -22 48 Normal sinus rhythm Normal ECG When compared with ECG of 24-Dec-2023 18:17, No significant change was found  Echo done today.  ASSESSMENT & PLAN CHA2DS2-VASc Score = 7  The patient's score is based upon: CHF History: 0 HTN History: 1 Diabetes History: 0 Stroke History: 2 Vascular Disease History: 1 Age Score: 2 Gender  Score: 1       ASSESSMENT AND PLAN: Paroxysmal Atrial Fibrillation (ICD10:  I48.0) The patient's CHA2DS2-VASc score is 7, indicating a 11.2% annual risk of stroke.    She is currently in NSR. Education provided about Afib. Discussion about triggers for Afib. After discussion, we will proceed with conservative observation at this time. Rhythm monitoring device recommended ([employer]obile).    Secondary Hypercoagulable State (ICD10:  D68.69) The patient is at significant risk for stroke/thromboembolism based upon her CHA2DS2-VASc Score of 7.  Continue Apixaban  (Eliquis ).  We discussed the reasoning behind anticoagulation in the setting of stroke prevention related to Afib. We discussed the benefits vs risks of anticoagulation. After discussion, patient would like to continue Eliquis  for stroke prevention. Continue Eliquis  5 mg BID; dosage correct based on weight > 60 kg and creatinine below 1.5. Printed order for patient to have CBC in 1 month.    Follow up as scheduled with Dr. Lavona in December. Follow up Afib clinic 12/2024.   Terra Pac, PA-C  Afib Clinic [facility] [address] [place], [place] [address] [phone]

## 2024-03-28 LAB — ECHOCARDIOGRAM COMPLETE
AV Mean grad: 5.8 mmHg
AV Peak grad: 10.4 mmHg
Ao pk vel: 1.62 m/s
Area-P 1/2: 2.78 cm2
MV M vel: 4.01 m/s
MV Peak grad: 64.3 mmHg
S' Lateral: 2.3 cm

## 2024-03-31 ENCOUNTER — Ambulatory Visit: Payer: Self-pay | Admitting: Cardiology

## 2024-04-01 ENCOUNTER — Ambulatory Visit: Admitting: Pharmacist

## 2024-04-01 NOTE — Progress Notes (Deleted)
 Patient ID: Tiffany Washington                 DOB: 21-Jan-1941                      MRN: [id number]      HPI: Tiffany Washington is a 83 y.o. female referred by Dr. PIERRETTE to HTN clinic. PMH is significant for  Current HTN meds:  Previously tried:  BP goal:   Family History:   Social History:   Diet:   Exercise:  {types:28256}  Home BP readings:  Date SBP/DBP  HR              Average      Wt Readings from Last 3 Encounters:  03/27/24 165 lb 6.4 oz (75 kg)  01/10/24 160 lb (72.6 kg)  12/24/23 175 lb 11.3 oz (79.7 kg)   BP Readings from Last 3 Encounters:  03/27/24 (!) 140/80  01/10/24 126/68  12/24/23 (!) 141/93   Pulse Readings from Last 3 Encounters:  03/27/24 64  01/10/24 66  12/24/23 76    Renal function: CrCl cannot be calculated (Patient's most recent lab result is older than the maximum 21 days allowed.).  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 Verlin & Dr. Lucas   Spondylolisthesis of lumbar region    Stroke [facility]) 2022   Right hand numbness    Current Outpatient Medications on File Prior to Visit  Medication Sig Dispense Refill   acetaminophen  (TYLENOL ) 325 MG tablet Take 650 mg by mouth every 6 (six) hours as needed for moderate pain (pain score 4-6) or headache.     apixaban  (ELIQUIS ) 5 MG TABS tablet Take 1 tablet (5 mg total) by mouth 2 (two) times  daily. 60 tablet 11   apixaban  (ELIQUIS ) 5 MG TABS tablet Take 1 tablet (5 mg total) by mouth 2 (two) times daily. 56 tablet 0   Ascorbic Acid (VITAMIN C) 1000 MG tablet Take 1,000 mg by mouth daily.     Calcium  Carbonate (CALCIUM  500 PO) Take 1,000 mg by mouth daily.     cholecalciferol (VITAMIN D3) 25 MCG (1000 UNIT) tablet Take 1,000 Units by mouth daily.     furosemide  (LASIX ) 40 MG tablet Take 40 mg by mouth daily as needed for fluid or edema.     hydroxypropyl methylcellulose / hypromellose (ISOPTO TEARS / GONIOVISC) 2.5 % ophthalmic solution Place 1 drop into both eyes as needed for dry eyes.     pantoprazole  (PROTONIX ) 20 MG tablet Take 20 mg by mouth daily as needed for heartburn or indigestion.     potassium chloride  (KLOR-CON ) 10 MEQ tablet Take 2 tablet (20 mEq total) by mouth daily. 180 tablet 2   pramipexole  (MIRAPEX )  0.25 MG tablet Take 0.25 mg by mouth 2 (two) times daily. 1600 and bedtime     PROLIA  60 MG/ML SOSY injection Inject 60 mg into the skin every 6 (six) months.     rosuvastatin  (CRESTOR ) 20 MG tablet Take 1 tablet (20 mg total) by mouth daily. 30 tablet 3   Zinc  30 MG CAPS Take 30 mg by mouth daily.     No current facility-administered medications on file prior to visit.    Allergies  Allergen Reactions   Ciprofloxacin Other (See Comments)    Patient stated she cannot take this   Diclofenac Other (See Comments)    Reaction not recalled   Indomethacin Other (See Comments)    Reaction not recalled   Mobic [Meloxicam] Other (See Comments)    Reaction not recalled   Tegretol [Carbamazepine] Other (See Comments)    Doesn't remember reaction   Voltaren [Diclofenac Sodium] Other (See Comments)    Reaction not recalled   Gabapentin Other (See Comments)    Makes the patient unable to sleep   Pamelor  [Nortriptyline  Hcl] Other (See Comments)    insomnia   Ropinirole Other (See Comments)    Speed up the restless legs   Sulfa Antibiotics Diarrhea and Nausea And  Vomiting    There were no vitals taken for this visit.   Assessment/Plan:  1. Hypertension -  No problem-specific Assessment & Plan notes found for this encounter.      Thank you  Robbi Blanch, Pharm.D [place] [facility]. [facility] [address], [place], [place] [address] Phone: [phone]; Fax: [phone]

## 2024-04-02 NOTE — Telephone Encounter (Signed)
 Pt returning call

## 2024-04-02 NOTE — Telephone Encounter (Signed)
 Sent new oop expenses with application for reconsideration

## 2024-04-03 ENCOUNTER — Telehealth: Payer: Self-pay | Admitting: Pharmacy Technician

## 2024-04-03 ENCOUNTER — Other Ambulatory Visit (HOSPITAL_COMMUNITY): Payer: Self-pay

## 2024-04-03 NOTE — Telephone Encounter (Signed)
 Patient Advocate Encounter   The patient was approved for a [employer] grant that will help cover the cost of Eliquis  5 Total amount awarded, 7500.00.  Effective: 03/04/24 - 03/03/25   APW:[id number] ERW:[id number] Hmnle:[id number] PI:[id number]  [employer] ID: [id number]   Pharmacy provided with approval and processing information. Patient informed via call

## 2024-04-03 NOTE — Telephone Encounter (Signed)
 Patient returned RN's call and stated can leave voice message.

## 2024-04-28 DIAGNOSIS — I4891 Unspecified atrial fibrillation: Secondary | ICD-10-CM | POA: Diagnosis not present

## 2024-04-29 ENCOUNTER — Ambulatory Visit (HOSPITAL_COMMUNITY): Payer: Self-pay | Admitting: Internal Medicine

## 2024-04-29 LAB — CBC
Hematocrit: 36.5 % (ref 34.0–46.6)
Hemoglobin: 11.6 g/dL (ref 11.1–15.9)
MCH: 30.6 pg (ref 26.6–33.0)
MCHC: 31.8 g/dL (ref 31.5–35.7)
MCV: 96 fL (ref 79–97)
Platelets: 252 x10E3/uL (ref 150–450)
RBC: 3.79 x10E6/uL (ref 3.77–5.28)
RDW: 13.4 % (ref 11.7–15.4)
WBC: 8.2 x10E3/uL (ref 3.4–10.8)

## 2024-05-03 DIAGNOSIS — M79671 Pain in right foot: Secondary | ICD-10-CM | POA: Diagnosis not present

## 2024-05-12 DIAGNOSIS — M810 Age-related osteoporosis without current pathological fracture: Secondary | ICD-10-CM | POA: Diagnosis not present

## 2024-05-12 DIAGNOSIS — R400 Somnolence: Secondary | ICD-10-CM | POA: Diagnosis not present

## 2024-05-12 DIAGNOSIS — E559 Vitamin D deficiency, unspecified: Secondary | ICD-10-CM | POA: Diagnosis not present

## 2024-05-12 DIAGNOSIS — S92354A Nondisplaced fracture of fifth metatarsal bone, right foot, initial encounter for closed fracture: Secondary | ICD-10-CM | POA: Diagnosis not present

## 2024-05-12 DIAGNOSIS — Z23 Encounter for immunization: Secondary | ICD-10-CM | POA: Diagnosis not present

## 2024-05-14 DIAGNOSIS — S92309A Fracture of unspecified metatarsal bone(s), unspecified foot, initial encounter for closed fracture: Secondary | ICD-10-CM | POA: Diagnosis not present

## 2024-05-14 DIAGNOSIS — S92354A Nondisplaced fracture of fifth metatarsal bone, right foot, initial encounter for closed fracture: Secondary | ICD-10-CM | POA: Diagnosis not present

## 2024-06-11 DIAGNOSIS — M79671 Pain in right foot: Secondary | ICD-10-CM | POA: Diagnosis not present

## 2024-06-11 DIAGNOSIS — S92354D Nondisplaced fracture of fifth metatarsal bone, right foot, subsequent encounter for fracture with routine healing: Secondary | ICD-10-CM | POA: Diagnosis not present

## 2024-06-17 DIAGNOSIS — M810 Age-related osteoporosis without current pathological fracture: Secondary | ICD-10-CM | POA: Diagnosis not present

## 2024-06-17 DIAGNOSIS — H524 Presbyopia: Secondary | ICD-10-CM | POA: Diagnosis not present

## 2024-06-17 DIAGNOSIS — E559 Vitamin D deficiency, unspecified: Secondary | ICD-10-CM | POA: Diagnosis not present

## 2024-06-17 DIAGNOSIS — H04123 Dry eye syndrome of bilateral lacrimal glands: Secondary | ICD-10-CM | POA: Diagnosis not present

## 2024-06-17 DIAGNOSIS — G2581 Restless legs syndrome: Secondary | ICD-10-CM | POA: Diagnosis not present

## 2024-06-17 DIAGNOSIS — R0683 Snoring: Secondary | ICD-10-CM | POA: Diagnosis not present

## 2024-06-17 DIAGNOSIS — S92354A Nondisplaced fracture of fifth metatarsal bone, right foot, initial encounter for closed fracture: Secondary | ICD-10-CM | POA: Diagnosis not present

## 2024-06-17 DIAGNOSIS — H353131 Nonexudative age-related macular degeneration, bilateral, early dry stage: Secondary | ICD-10-CM | POA: Diagnosis not present

## 2024-07-04 ENCOUNTER — Ambulatory Visit

## 2024-07-07 DIAGNOSIS — I480 Paroxysmal atrial fibrillation: Secondary | ICD-10-CM | POA: Insufficient documentation

## 2024-07-07 NOTE — Progress Notes (Unsigned)
 Cardiology Office Note:   Date:  07/07/2024  ID:  Tiffany Washington, DOB 11-14-1940, MRN [id number] PCP: Tiffany Hamilton, MD  [name] Providers Cardiologist:  Lynwood Schilling, MD {  History of Present Illness:   Tiffany Washington is a 83 y.o. female who presents for evaluation of coronary calcium .  This was noted to have coronary calcium  on a CT.  This was in the LM and was three vessel.  The CT was done to evaluate abdominal pain.    She had a negative Lexiscan  Myoview  in 2019.    She was admitted in 11/2021 for acute hypoxic respiratory failure secondary to acute diastolic heart failure and severe aortic stenosis. 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   Since I last saw her she had a monitor that demonstrated PAF.  She was seen by the [facility] and was started on Eliquis .  ***     ***  she was in the ED in May for palpitations and numbness in her face.  I reviewed these records for this visit.  This happened at rest and she had no visual or voice changes but she felt a tingling in her face.  They did do an angiogram and found no large vessel occlusion.  She had moderate stenosis of P2 with the proximal right PCA.  There was some carotid mild plaque.  She was suggested to have an MRI but she did not want to do this.  She had resolution of her symptoms and she has had none of this since then.  She did describe some probable palpitations at the time this happened.  She says she had that about 6 months before but she has not been having it since then.  She gets around with a walker because of joint pain but she can still do all her yard work including growing her garden.  She is not having any chest pressure, neck or arm discomfort.  She is not having any new shortness of breath, PND or orthopnea.  ROS: ***  Studies Reviewed:    EKG:       ***  Risk Assessment/Calculations:   {Does this patient have ATRIAL  FIBRILLATION?:[phone]} No BP recorded.  {Refresh Note OR Click here to enter BP  :1}***        Physical Exam:   VS:  There were no vitals taken for this visit.   Wt Readings from Last 3 Encounters:  03/27/24 165 lb 6.4 oz (75 kg)  01/10/24 160 lb (72.6 kg)  12/24/23 175 lb 11.3 oz (79.7 kg)     GEN: Well nourished, well developed in no acute distress NECK: No JVD; No carotid bruits CARDIAC: ***RR, *** murmurs, rubs, gallops RESPIRATORY:  Clear to auscultation without rales, wheezing or rhonchi  ABDOMEN: Soft, non-tender, non-distended EXTREMITIES:  No edema; No deformity   ASSESSMENT AND PLAN:   CORONARY ARTERY CALCIFICATION:   ***  She has had no chest discomfort.  We will continue with risk reduction.  ATRIAL FIB:  ***     TAVR:    This was stable on echo in August 2025.   ***      This will be followed up as below.   MR:   This is moderate to severe.  ***    I will follow-up with an echocardiogram in August.   DYSLIPIDEMIA:   Her LDL was *** 37.  No change in therapy.  HTN:   Her  BP is ***at target.  No change in therapy.   TIA:   ***  She had a probable TIA.  She also has some palpitations.  She is to see a neurologist and I encouraged her to make this appointment.  I am also going to check a 4-week monitor to rule out arrhythmias.     Follow up ***  Signed, Lynwood Schilling, MD

## 2024-07-09 ENCOUNTER — Encounter: Payer: Self-pay | Admitting: Cardiology

## 2024-07-09 ENCOUNTER — Ambulatory Visit: Attending: Cardiology | Admitting: Cardiology

## 2024-07-09 ENCOUNTER — Ambulatory Visit

## 2024-07-09 VITALS — BP 130/64 | HR 83 | Temp 97.3°F | Ht 64.0 in | Wt 172.4 lb

## 2024-07-09 VITALS — BP 120/60 | HR 60 | Ht 64.0 in | Wt 172.0 lb

## 2024-07-09 DIAGNOSIS — E785 Hyperlipidemia, unspecified: Secondary | ICD-10-CM | POA: Diagnosis not present

## 2024-07-09 DIAGNOSIS — I480 Paroxysmal atrial fibrillation: Secondary | ICD-10-CM | POA: Diagnosis not present

## 2024-07-09 DIAGNOSIS — Z952 Presence of prosthetic heart valve: Secondary | ICD-10-CM | POA: Diagnosis not present

## 2024-07-09 DIAGNOSIS — I2510 Atherosclerotic heart disease of native coronary artery without angina pectoris: Secondary | ICD-10-CM | POA: Diagnosis not present

## 2024-07-09 DIAGNOSIS — I10 Essential (primary) hypertension: Secondary | ICD-10-CM

## 2024-07-09 DIAGNOSIS — G2581 Restless legs syndrome: Secondary | ICD-10-CM | POA: Diagnosis not present

## 2024-07-09 DIAGNOSIS — G4719 Other hypersomnia: Secondary | ICD-10-CM | POA: Diagnosis not present

## 2024-07-09 DIAGNOSIS — I340 Nonrheumatic mitral (valve) insufficiency: Secondary | ICD-10-CM | POA: Diagnosis not present

## 2024-07-09 NOTE — Patient Instructions (Addendum)
 Medication Instructions:  Your physician recommends that you continue on your current medications as directed. Please refer to the Current Medication list given to you today.  *If you need a refill on your cardiac medications before your next appointment, please call your pharmacy*  Lab Work: NONE If you have labs (blood work) drawn today and your tests are completely normal, you will receive your results only by: MyChart Message (if you have MyChart) OR A paper copy in the mail If you have any lab test that is abnormal or we need to change your treatment, we will call you to review the results.  Testing/Procedures: Echocardiogram in Aug 2026 Your physician has requested that you have an echocardiogram. Echocardiography is a painless test that uses sound waves to create images of your heart. It provides your doctor with information about the size and shape of your heart and how well your hearts chambers and valves are working. This procedure takes approximately one hour. There are no restrictions for this procedure. Please do NOT wear cologne, perfume, aftershave, or lotions (deodorant is allowed). Please arrive 15 minutes prior to your appointment time.  Please note: We ask at that you not bring children with you during ultrasound (echo/ vascular) testing. Due to room size and safety concerns, children are not allowed in the ultrasound rooms during exams. Our front office staff cannot provide observation of children in our lobby area while testing is being conducted. An adult accompanying a patient to their appointment will only be allowed in the ultrasound room at the discretion of the ultrasound technician under special circumstances. We apologize for any inconvenience.   Follow-Up: At [facility], you and your health needs are our priority.  As part of our continuing mission to provide you with exceptional heart care, our providers are all part of one team.  This team includes  your primary Cardiologist (physician) and Advanced Practice Providers or APPs (Physician Assistants and Nurse Practitioners) who all work together to provide you with the care you need, when you need it.  Your next appointment:   6 month(s)  Provider:   You will follow up in the [facility]   Your provider will be: Fairy Heinrich, PA-C    We recommend signing up for the patient portal called MyChart.  Sign up information is provided on this After Visit Summary.  MyChart is used to connect with patients for Virtual Visits (Telemedicine).  Patients are able to view lab/test results, encounter notes, upcoming appointments, etc.  Non-urgent messages can be sent to your provider as well.   To learn more about what you can do with MyChart, go to [email or web address].

## 2024-07-09 NOTE — Progress Notes (Addendum)
 Pulmonology Office Visit   Subjective:  Patient ID: Tiffany Washington, female    DOB: 1941/03/27  MRN: [id number]  Referred by: Tiffany Manuelita RAMAN, NP  CC:  Chief Complaint  Patient presents with   Consult    Not able to go to sleep and not sleeping good when able to go to sleep.  Goes to sleep while eating breakfast.    HPI Tiffany Washington is a 83 y.o. female with Severe AS, CHF, hypertension, paroxysmal A-fib, GERD, ICH[in 2023 did not affect sleep habit] presents for evaluation of excessive daytime sleepiness.  Respective notes from provider reviewed as appropriate to gather relevant information for patient care.   Discussed the use of AI scribe software for clinical note transcription with the patient, who gave verbal consent to proceed.  History of Present Illness          Snoring- no  Witnessed apnea- no  Gasping/choking- no  morning HA/dry mouth- no/y   tired on awakening, excessive daytime sleepiness- yes, all day   Restless legs- significant. Dx in 12s.  Is on pramipexole  0.25 mg twice a day and recently started on Lyrica  50.  Symptoms much better after initiating Lyrica .  Symptoms are persistent but worse at night.  Unsure if she ever had iron studies done.   No parasomnias.  Her sleep patterns has not changed since having a stroke [brain bleed] 2 years ago.  Sleep routine:    -Bed: Around 10 PM, onset it might take a long to fall asleep.-Nocturnal awakenings: Wakes up multiple number of times.   -Wake: Finally out of bed around 4 AM.   -Napping: No naps but may fall asleep whilst watching TV .  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   PRIOR TESTS and IMAGING: No on file.     07/09/2024    1:00 PM  Results of the Epworth flowsheet  Sitting and reading 3  Watching TV 3  Sitting, inactive in a public  place (e.g. a theatre or a meeting) 3  As a passenger in a car for an hour without a break 3  Lying down to rest in the afternoon when circumstances permit 0  Sitting and talking to someone 3  Sitting quietly after a lunch without alcohol  3  In a car, while stopped for a few minutes in traffic 3  Total score 21    Allergies: Ciprofloxacin, Diclofenac, Indomethacin, Mobic [meloxicam], Tegretol [carbamazepine], Voltaren [diclofenac sodium], Gabapentin, Pamelor  [nortriptyline  hcl], Ropinirole, and Sulfa antibiotics  Current Outpatient Medications:    acetaminophen  (TYLENOL ) 325 MG tablet, Take 650 mg by mouth every 6 (six) hours as needed for moderate pain (pain score 4-6) or headache., Disp: , Rfl:    apixaban  (ELIQUIS ) 5 MG TABS tablet, Take 1 tablet (5 mg total) by mouth 2 (two) times daily., Disp: 56 tablet, Rfl: 0   Ascorbic Acid (VITAMIN C) 1000 MG tablet, Take 1,000 mg by mouth daily., Disp: , Rfl:    Calcium  Carbonate (CALCIUM  500 PO), Take 1,000 mg by mouth daily., Disp: , Rfl:    cholecalciferol (VITAMIN D3) 25 MCG (1000 UNIT) tablet, Take 1,000 Units by mouth daily., Disp: , Rfl:    furosemide  (LASIX ) 20 MG tablet, Take 20 mg by mouth daily., Disp: , Rfl:    hydroxypropyl methylcellulose /  hypromellose (ISOPTO TEARS / GONIOVISC) 2.5 % ophthalmic solution, Place 1 drop into both eyes as needed for dry eyes., Disp: , Rfl:    pantoprazole  (PROTONIX ) 20 MG tablet, Take 20 mg by mouth daily as needed for heartburn or indigestion., Disp: , Rfl:    potassium chloride  (KLOR-CON ) 10 MEQ tablet, Take 2 tablet (20 mEq total) by mouth daily., Disp: 180 tablet, Rfl: 2   pramipexole  (MIRAPEX ) 0.25 MG tablet, Take 0.25 mg by mouth 2 (two) times daily. 1600 and bedtime, Disp: , Rfl:    pregabalin  (LYRICA ) 50 MG capsule, 1 capsule., Disp: , Rfl:    rosuvastatin  (CRESTOR ) 20 MG tablet, Take 1 tablet (20 mg total) by mouth daily., Disp: 30 tablet, Rfl: 3   TYMLOS 3120 MCG/1.56ML SOPN, inject 80mcg  Subcutaneous daily; Duration: 30 days, Disp: , Rfl:    Zinc  30 MG CAPS, Take 30 mg by mouth daily., Disp: , Rfl:    furosemide  (LASIX ) 40 MG tablet, Take 40 mg by mouth daily as needed for fluid or edema. 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 Verlin & Dr. Lucas   Spondylolisthesis of lumbar region    Stroke [facility]) 2022   Right hand numbness   Past Surgical History:  Procedure Laterality Date   BACK SURGERY  10/2019   CESAREAN SECTION     COLONOSCOPY     COLONOSCOPY W/ BIOPSIES AND POLYPECTOMY     INTRAOPERATIVE TRANSTHORACIC ECHOCARDIOGRAM N/A 03/21/2022   Procedure: INTRAOPERATIVE TRANSTHORACIC ECHOCARDIOGRAM;  Surgeon: Tiffany Lonni BIRCH, MD;  Location: [facility] INV[facility]IVE [facility] LAB;  Service: Open Heart Surgery;  Laterality: N/A;   KNEE SURGERY Bilateral    Bilateral   KYPHOPLASTY N/A 01/11/2023   Procedure: KYPHOPLASTY THORACIC SEVEN-THORACIC EIGHT;  Surgeon: Tiffany Purchase, MD;  Location: [facility] OR;  Service: Neurosurgery;  Laterality: N/A;   MULTIPLE TOOTH EXTRACTIONS     POLYPECTOMY     RIGHT/LEFT HEART CATH AND CORONARY ANGIOGRAPHY N/A 12/06/2021   Procedure: RIGHT/LEFT HEART CATH AND CORONARY ANGIOGRAPHY;  Surgeon: Tiffany Victory ORN, MD;  Location: [facility] INV[facility] CV [facility];  Service: Cardiovascular;  Laterality: N/A;   TRANSCATHETER AORTIC VALVE  REPLACEMENT, TRANSFEMORAL N/A 03/21/2022   Procedure: Transcatheter Aortic Valve Replacement, Transfemoral;  Surgeon: Tiffany Lonni BIRCH, MD;  Location: [facility];  Service: Open Heart Surgery;  Laterality: N/A;   UPPER GASTROINTESTINAL ENDOSCOPY     VAGINAL HYSTERECTOMY     Family History  Problem Relation Age of Onset   Hypertension Mother    Congestive Heart Failure Mother        Died, 41   Heart failure Mother    Pulmonary embolism Father        Died, 36   Diabetes Sister  CAD Brother 53       CABG   Diabetes Brother    Gout Son    Social History   Socioeconomic History   Marital status: Married    Spouse name: Theo   Number of children: 2   Years of education: Not on file   Highest education level: High school graduate  Occupational History   Occupation: retired  Tobacco Use   Smoking status: Never   Smokeless tobacco: Never  Vaping Use   Vaping status: Never Used  Substance and Sexual Activity   Alcohol  use: No   Drug use: No   Sexual activity: Not Currently  Other Topics Concern   Not on file  Social History Narrative   Working on a farm   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   She is married for 52 years and they have two children.   Social Drivers of Corporate Investment Banker Strain: Not on file  Food Insecurity: No Food Insecurity (12/08/2021)   Hunger Vital Sign    Worried About Running Out of Food in the Last Year: Never true    Ran Out of Food in the Last Year: Never true  Transportation Needs: No Transportation Needs (12/08/2021)   PRAPARE - Administrator, Civil Service (Medical): No    Lack of Transportation (Non-Medical): No  Physical Activity: Not on file  Stress: Not on file  Social Connections: Not on file  Intimate Partner Violence: Not on file       Objective:  BP 130/64   Pulse 83   Temp (!) 97.3 F (36.3 C) (Temporal)   Ht 5' 4 (1.626 m)   Wt 172 lb 6.4 oz (78.2 kg)   SpO2 97%  Comment: room air  BMI 29.59 kg/m  BMI Readings from Last 3 Encounters:  07/09/24 29.59 kg/m  07/09/24 29.52 kg/m  03/27/24 28.39 kg/m    Physical Exam: Physical Exam          General: Elderly female not in distress Lungs: clear to auscultation bilaterally.  Heart: regular rate rhythm, no murmur appreciated.  Abdomen: non tender, non distended. Normal BS.  Neuro: axox3.  Moving all extremities.   Diagnostic Review:  Last metabolic panel Lab Results  Component Value Date   GLUCOSE 101 (H) 12/24/2023   NA 138 12/24/2023   K 4.0 12/24/2023   CL 107 12/24/2023   CO2 23 12/24/2023   BUN 12 12/24/2023   CREATININE 0.85 12/24/2023   GFRNONAA >60 12/24/2023   CALCIUM  9.1 12/24/2023   PROT 6.9 12/01/2022   ALBUMIN  3.9 12/01/2022   LABGLOB 3.0 12/01/2022   AGRATIO 1.3 12/01/2022   BILITOT 0.7 12/01/2022   ALKPHOS 123 (H) 12/01/2022   AST 18 12/01/2022   ALT 17 12/01/2022   ANIONGAP 8 12/24/2023         Assessment & Plan:   Assessment & Plan Excessive daytime sleepiness No classic symptoms of OSA but does have excessive daytime sleepiness.  Will rule out sleep apnea. I discussed with the patient the pathophysiology of obstructive sleep apnea, its association with weight, and its negative effects on hypertension, diabetes, mental health, A-fib, stroke if left untreated.  I briefly discussed the treatment options for obstructive sleep apnea Inadequate sleep could contribute to EDS.  Orders:   Home sleep test; Future  Restless leg syndrome Persistent RLS present chronically.  Rule out iron deficiency. Discussed about augmentation with pramipexole .  Currently symptoms well-controlled. -Decrease pramipexole  to  0.25 mg once a day. - Continue Lyrica  50 mg. -Iron supplementation based on iron studies. Orders:   Iron, TIBC and Ferritin Panel; Future     Assessment and Plan           Notes from 05/12/2024 by PCP reviewed as to gather relevant information for  patient care and formulating plan.   She was counselled about not driving while drowsy which is common side effect of sleep related disorders.   Return for 1 mnth after Sleep study.   I personally spent a total of 30 minutes in the care of the patient today including preparing to see the patient, getting/reviewing separately obtained history, performing a medically appropriate exam/evaluation, counseling and educating, placing orders, documenting clinical information in the EHR, independently interpreting results, and communicating results.   Mindy Behnken, MD

## 2024-07-09 NOTE — Patient Instructions (Signed)
 Please stop taking 0.25 mg of pramipexole  which you take in the afternoon.  Continue with Lyrica  and 0.25 mg at nighttime We will decide on iron replacement based on the iron studies.  Home sleep study has been ordered and someone will reach out to you to schedule the home sleep study.

## 2024-07-10 LAB — IRON,TIBC AND FERRITIN PANEL
%SAT: 17 % (ref 16–45)
Ferritin: 23 ng/mL (ref 16–288)
Iron: 63 ug/dL (ref 45–160)
TIBC: 364 ug/dL (ref 250–450)

## 2024-07-14 ENCOUNTER — Ambulatory Visit: Payer: Self-pay

## 2024-07-14 MED ORDER — FERROUS SULFATE 325 (65 FE) MG PO TABS: 325.0000 mg | ORAL_TABLET | Freq: Two times a day (BID) | ORAL | 2 refills | Status: AC

## 2024-07-14 NOTE — Progress Notes (Signed)
 Results have been reviewed. Please let patients know her iron levels are low.  Since she is currently asymptomatic in regards to her restless leg symptoms with addition of Lyrica  I would recommend her taking ferrous sulfate  325 mg twice daily [prescribed] along with vitamin C [OTC].  Consider taking MiraLAX [OTC] once a day if you develop constipation as iron can cause constipation.

## 2024-07-17 NOTE — Progress Notes (Signed)
 [id number] [id number]

## 2024-07-17 NOTE — Telephone Encounter (Signed)
 Copied from CRM #[id number]. Topic: Clinical - Lab/Test Results >> Jul 17, 2024  3:30 PM Rozanna MATSU wrote: Reason for CRM: pt returning call about labs, attemtped to call [facility] no answer   Called and spoke to pt. Advised of lab results per Dr. Theodoro. Pt verbalized understanding, NFN.

## 2024-07-22 ENCOUNTER — Telehealth: Payer: Self-pay

## 2024-07-22 NOTE — Telephone Encounter (Signed)
 Copied from CRM #[id number]. Topic: Clinical - Request for Lab/Test Order >> Jul 22, 2024 11:04 AM Devaughn RAMAN wrote: Reason for CRM: Pt's husband Theo called regarding sleep study test for pt. Theo stated pt was advised she would receive a phone call regarding scheduling sleep study test and they were calling to f/u   [facility] please advise on scheduling HST

## 2024-07-22 NOTE — Telephone Encounter (Signed)
 Spoke with pt husband and gave him SNAPS number. Verbalized understanding. NFN

## 2024-08-07 ENCOUNTER — Encounter

## 2024-08-07 DIAGNOSIS — G4719 Other hypersomnia: Secondary | ICD-10-CM

## 2024-08-28 ENCOUNTER — Telehealth: Payer: Self-pay

## 2024-08-28 DIAGNOSIS — G4733 Obstructive sleep apnea (adult) (pediatric): Secondary | ICD-10-CM

## 2024-08-28 NOTE — Telephone Encounter (Signed)
 Date of Study: 08/07/24  Interpretation: Moderate and Severe OSA with AHI of 26, O2 desaturation 120 min. O2 nadir 70%.   Plan: CPAP titration study [please order] due to low oxygen in sleep. Side sleep.   Sammi Fredericks, MD.

## 2024-08-29 NOTE — Addendum Note (Signed)
 Addended byBETHA JESSICA BOUCHARD S on: 08/29/2024 02:11 PM   Modules accepted: Orders

## 2024-11-10 ENCOUNTER — Ambulatory Visit (HOSPITAL_BASED_OUTPATIENT_CLINIC_OR_DEPARTMENT_OTHER)

## 2025-03-09 ENCOUNTER — Ambulatory Visit (HOSPITAL_COMMUNITY)
